# Patient Record
Sex: Female | Born: 1950 | Race: Black or African American | Hispanic: No | Marital: Married | Smoking: Former smoker
Health system: Southern US, Community
[De-identification: ages and names within clinical notes are randomized; demographics above are authoritative.]

## PROBLEM LIST (undated history)

## (undated) ENCOUNTER — Ambulatory Visit: Source: Home / Self Care

## (undated) DIAGNOSIS — K219 Gastro-esophageal reflux disease without esophagitis: Secondary | ICD-10-CM

## (undated) DIAGNOSIS — I10 Essential (primary) hypertension: Secondary | ICD-10-CM

## (undated) DIAGNOSIS — K589 Irritable bowel syndrome without diarrhea: Secondary | ICD-10-CM

## (undated) DIAGNOSIS — K648 Other hemorrhoids: Secondary | ICD-10-CM

## (undated) DIAGNOSIS — E785 Hyperlipidemia, unspecified: Secondary | ICD-10-CM

## (undated) DIAGNOSIS — H269 Unspecified cataract: Secondary | ICD-10-CM

## (undated) DIAGNOSIS — K5790 Diverticulosis of intestine, part unspecified, without perforation or abscess without bleeding: Secondary | ICD-10-CM

## (undated) DIAGNOSIS — D649 Anemia, unspecified: Secondary | ICD-10-CM

## (undated) DIAGNOSIS — T7840XA Allergy, unspecified, initial encounter: Secondary | ICD-10-CM

## (undated) DIAGNOSIS — M797 Fibromyalgia: Secondary | ICD-10-CM

## (undated) DIAGNOSIS — K635 Polyp of colon: Secondary | ICD-10-CM

## (undated) HISTORY — DX: Unspecified cataract: H26.9

## (undated) HISTORY — DX: Diverticulosis of intestine, part unspecified, without perforation or abscess without bleeding: K57.90

## (undated) HISTORY — PX: COLONOSCOPY: SHX174

## (undated) HISTORY — DX: Fibromyalgia: M79.7

## (undated) HISTORY — PX: UPPER GASTROINTESTINAL ENDOSCOPY: SHX188

## (undated) HISTORY — DX: Gastro-esophageal reflux disease without esophagitis: K21.9

## (undated) HISTORY — DX: Allergy, unspecified, initial encounter: T78.40XA

## (undated) HISTORY — PX: TUBAL LIGATION: SHX77

## (undated) HISTORY — DX: Anemia, unspecified: D64.9

## (undated) HISTORY — DX: Essential (primary) hypertension: I10

## (undated) HISTORY — DX: Hyperlipidemia, unspecified: E78.5

## (undated) HISTORY — DX: Other hemorrhoids: K64.8

## (undated) HISTORY — DX: Irritable bowel syndrome, unspecified: K58.9

## (undated) HISTORY — PX: MYOMECTOMY: SHX85

## (undated) HISTORY — DX: Polyp of colon: K63.5

---

## 1998-08-29 ENCOUNTER — Encounter: Payer: Self-pay | Admitting: Emergency Medicine

## 1998-08-29 ENCOUNTER — Emergency Department (HOSPITAL_COMMUNITY): Admission: EM | Admit: 1998-08-29 | Discharge: 1998-08-29 | Payer: Self-pay | Admitting: Emergency Medicine

## 2000-05-17 ENCOUNTER — Other Ambulatory Visit: Admission: RE | Admit: 2000-05-17 | Discharge: 2000-05-17 | Payer: Self-pay | Admitting: Obstetrics and Gynecology

## 2000-11-04 ENCOUNTER — Emergency Department (HOSPITAL_COMMUNITY): Admission: EM | Admit: 2000-11-04 | Discharge: 2000-11-04 | Payer: Self-pay | Admitting: Emergency Medicine

## 2001-12-06 ENCOUNTER — Other Ambulatory Visit: Admission: RE | Admit: 2001-12-06 | Discharge: 2001-12-06 | Payer: Self-pay | Admitting: Obstetrics and Gynecology

## 2006-02-09 ENCOUNTER — Ambulatory Visit: Payer: Self-pay | Admitting: Gastroenterology

## 2006-03-09 ENCOUNTER — Ambulatory Visit: Payer: Self-pay | Admitting: Gastroenterology

## 2006-03-09 ENCOUNTER — Encounter (INDEPENDENT_AMBULATORY_CARE_PROVIDER_SITE_OTHER): Payer: Self-pay | Admitting: *Deleted

## 2006-04-08 ENCOUNTER — Emergency Department (HOSPITAL_COMMUNITY): Admission: EM | Admit: 2006-04-08 | Discharge: 2006-04-08 | Payer: Self-pay | Admitting: Emergency Medicine

## 2007-03-11 DIAGNOSIS — K635 Polyp of colon: Secondary | ICD-10-CM

## 2007-03-11 HISTORY — DX: Polyp of colon: K63.5

## 2009-05-07 ENCOUNTER — Encounter: Admission: RE | Admit: 2009-05-07 | Discharge: 2009-05-07 | Payer: Self-pay | Admitting: Obstetrics & Gynecology

## 2010-05-29 NOTE — Assessment & Plan Note (Signed)
[facility]                         GASTROENTEROLOGY OFFICE NOTE   [name], [name]                    MRN:          [id number]  DATE:02/09/2006                            DOB:          Mar 05, 1950    REFERRING PHYSICIAN:  Georgina Quint. Plotnikov, MD   REASON FOR REFERRAL:  Hematochezia and a change in bowel habits.   HISTORY OF PRESENT ILLNESS:  [name] is a 60 year old  African/American female that I previously evaluated in 1999.  She  underwent a colonoscopy at that time for irritable bowel syndrome with  diarrhea and rectal bleeding. External hemorrhoids and a tortuous colon  were noted.  She states that she has had persistent problems with  postprandial diarrhea on a fairly frequent basis, up until about one  year ago, when she developed problems with mild constipation and  difficulty in evacuating stool.  She notes that her stools are generally  soft, but have been difficult to evacuate.  She has had about a one-year  history of intermittent small volume hematochezia and attributes these  symptoms to her known hemorrhoids.  She also notes that her hemorrhoids  tend to swell, but are not painful.  She has gas, bloating and abdominal  pain.  She has infrequent episodes of reflux symptoms associated with  heartburn and nausea.  She takes over-the-counter medications when these  symptoms are active.  They occur about once or twice a month.  She has  two cousins with colon cancer, who developed the disease in their 2's  and 26's.  Her sister developed colon polyps at age 41, and she also has  two uncles with colon polyps.  She notes no weight loss, fevers, chills  or vomiting.   PAST MEDICAL HISTORY:  1. Fibromyalgia.  2. Chronic back pain, leg pain and hip pain.  3. Atypical migraine headaches.  4. Irritable bowel syndrome.  5. Hemorrhoids.  6. Hypertension.  7. Status post bilateral tubal ligation.   CURRENT MEDICATIONS:  Tylenol  p.r.n.   ALLERGIES:  No known drug allergies.   SOCIAL HISTORY/REVIEW OF SYSTEMS:  Per the handwritten form.   PHYSICAL EXAMINATION:  GENERAL:  A well-developed female, in no acute  distress.  VITAL SIGNS:  Height 5 feet 1 inch, weight 159 pounds, blood pressure  122/80, pulse 68 and regular.  HEENT:  Anicteric sclerae.  Oropharynx clear.  CHEST:  Clear to auscultation bilaterally.  HEART:  Regular rate and rhythm without murmurs appreciated.  ABDOMEN:  Soft, nontender, non-distended.  Normoactive bowel sounds.  No  palpable organomegaly, masses or hernia.  RECTAL:  Deferred until time of colonoscopy.  EXTREMITIES:  Without clubbing, cyanosis or edema.  NEUROLOGIC:  Alert and oriented x3.  Grossly nonfocal.   ASSESSMENT/PLAN:  1. Intermittent hematochezia, associated with a change in bowel      habits, with mild constipation and difficulty in fecal evacuation.      Known external hemorrhoids.  Rule out colorectal neoplasms,      hemorrhoidal symptoms, proctitis and a rectocele.  She is advised      to schedule an appointment with her gynecologist.  The risks,      benefits and alternatives to colonoscopy with possible biopsy and      possible polypectomy and possible destruction of internal      hemorrhoids were discussed with the patient and she consents to      proceed.  This will be scheduled electively.  2. Intermittent reflux symptoms:  She is given standard instructions      on anti-reflux measures and she may use an over-the-counter      medication such as Pepcid AC or Zantac 150 on a p.r.n. basis.     Venita Lick. Russella Dar, MD, [facility]  Electronically Signed    MTS/MedQ  DD: 02/09/2006  DT: 02/09/2006  Job #: [id number]   cc:   Georgina Quint. Plotnikov, MD

## 2011-03-04 ENCOUNTER — Encounter: Payer: Self-pay | Admitting: Gastroenterology

## 2011-10-25 ENCOUNTER — Ambulatory Visit: Payer: Self-pay | Admitting: Physician Assistant

## 2011-10-25 VITALS — BP 165/89 | HR 60 | Temp 98.0°F | Resp 16 | Ht 62.5 in | Wt 160.0 lb

## 2011-10-25 DIAGNOSIS — F419 Anxiety disorder, unspecified: Secondary | ICD-10-CM

## 2011-10-25 DIAGNOSIS — L299 Pruritus, unspecified: Secondary | ICD-10-CM

## 2011-10-25 DIAGNOSIS — F411 Generalized anxiety disorder: Secondary | ICD-10-CM

## 2011-10-25 DIAGNOSIS — I10 Essential (primary) hypertension: Secondary | ICD-10-CM

## 2011-10-25 MED ORDER — LISINOPRIL 20 MG PO TABS: 20.0000 mg | ORAL_TABLET | Freq: Every day | ORAL | Status: DC

## 2011-10-25 MED ORDER — CITALOPRAM HYDROBROMIDE 20 MG PO TABS: 20.0000 mg | ORAL_TABLET | Freq: Every day | ORAL | Status: DC

## 2011-10-25 MED ORDER — ALPRAZOLAM 0.25 MG PO TABS: 0.2500 mg | ORAL_TABLET | Freq: Two times a day (BID) | ORAL | Status: DC | PRN

## 2011-10-25 NOTE — Progress Notes (Signed)
Subjective:    Patient ID: [name], female    DOB: 1950-06-20, 61 y.o.   MRN: [id number]  HPI  [name] is a 61 yr old female here with multiple complaints.  First, her blood pressure is consistently elevated.  At home, her BP has peaked at 164/112.  She has previously been treated with HCTZ several years ago, but ran out of the medication and stopped taking it.  Her second complaint is that her skin has been itching for 1-2 months.  She has had no new exposures.  No one else in the home is itching.  She does not note any rash.  She has not taken anything for the itching.  Her third complaint is that she needs something for her "nerves".  She describes feeling stress and tension.  She is not able to relax.  She is occasionally having difficulty sleeping.  She has episodes where she feels her blood pressure rise and her pulse races.  The patient does not have insurance and is concerned about cost of medication.  She is also concerned about medication side effects.    Review of Systems  Constitutional: Negative for fever and chills.  HENT: Negative.   Eyes: Negative.   Respiratory: Negative.   Cardiovascular: Positive for palpitations.  Gastrointestinal: Positive for abdominal pain (IBS).  Genitourinary: Negative.   Musculoskeletal: Negative.   Skin: Negative.   Neurological: Negative for dizziness, syncope, light-headedness and headaches.  Psychiatric/Behavioral: The patient is nervous/anxious.        Objective:   Physical Exam  Constitutional: She is oriented to person, place, and time. She appears well-developed and well-nourished. No distress.  Eyes: EOM are normal. Pupils are equal, round, and reactive to light.  Fundoscopic exam:      The right eye shows no AV nicking, no exudate, no hemorrhage and no papilledema. The right eye shows red reflex.      The left eye shows no AV nicking, no exudate, no hemorrhage and no papilledema. The left eye shows red reflex. Neck: Neck  supple. No thyromegaly present.  Cardiovascular: Normal rate, regular rhythm, normal heart sounds and intact distal pulses.  Exam reveals no gallop and no friction rub.   No murmur heard. Pulmonary/Chest: Breath sounds normal. She has no wheezes. She has no rales.  Lymphadenopathy:    She has no cervical adenopathy.  Neurological: She is alert and oriented to person, place, and time.  Skin: Skin is warm and dry. No rash noted. No erythema.  Psychiatric: She has a normal mood and affect. Her behavior is normal. Judgment and thought content normal.    Filed Vitals:   10/25/11 1624  BP: 165/89  Pulse: 60  Temp: 98 F (36.7 C)  Resp: 16         Assessment & Plan:   1. Hypertension  lisinopril (PRINIVIL,ZESTRIL) 20 MG tablet, Comprehensive metabolic panel  2. Pruritus    3. Anxiety  citalopram (CELEXA) 20 MG tablet, ALPRAZolam (XANAX) 0.25 MG tablet   [name] is a 61 yr old female here with multiple complaints.  Given her elevated blood pressure in clinic today and her history of using antihypertensives, will start her on 20mg  of lisinopril today.  Discussed the importance of keeping her blood pressure under control.  Cmet is pending for baseline kidney function.  For pruritus, I have recommended that she try OTC cetirizine for relief of symptoms.  She is amenable to a trial of celexa and prn xanax for her  anxiety symptoms.  Discussed that celexa should be taken daily, while the xanax should be used prn for acute anxiety symptoms.  Discussed with the patient the importance of follow-up during the first few weeks of treatment.  She was concerned about the cost of another visit because she is self-pay, but agreed to return in 4-6 weeks to follow-up on the medications that were started today.

## 2011-10-26 LAB — COMPREHENSIVE METABOLIC PANEL
AST: 15 U/L (ref 0–37)
Albumin: 3.9 g/dL (ref 3.5–5.2)
Alkaline Phosphatase: 51 U/L (ref 39–117)
Potassium: 4.4 mEq/L (ref 3.5–5.3)
Sodium: 141 mEq/L (ref 135–145)
Total Protein: 6.6 g/dL (ref 6.0–8.3)

## 2011-10-26 NOTE — Progress Notes (Signed)
Reviewed and agree.

## 2011-10-26 NOTE — Progress Notes (Deleted)
  Subjective:    Patient ID: [name], female    DOB: 04/23/1950, 61 y.o.   MRN: [id number]  HPI 61 year old female presents with     Review of Systems     Objective:   Physical Exam        Assessment & Plan:

## 2011-10-27 ENCOUNTER — Encounter: Payer: Self-pay | Admitting: Gastroenterology

## 2012-11-02 ENCOUNTER — Other Ambulatory Visit: Payer: Self-pay | Admitting: Physician Assistant

## 2013-02-12 ENCOUNTER — Other Ambulatory Visit: Payer: Self-pay | Admitting: Physician Assistant

## 2013-02-17 ENCOUNTER — Other Ambulatory Visit: Payer: Self-pay | Admitting: Physician Assistant

## 2013-12-04 ENCOUNTER — Ambulatory Visit (INDEPENDENT_AMBULATORY_CARE_PROVIDER_SITE_OTHER): Payer: No Typology Code available for payment source | Admitting: Family Medicine

## 2013-12-04 VITALS — BP 124/74 | HR 66 | Temp 98.3°F | Resp 18 | Ht 62.25 in | Wt 161.4 lb

## 2013-12-04 DIAGNOSIS — Z0000 Encounter for general adult medical examination without abnormal findings: Secondary | ICD-10-CM

## 2013-12-04 DIAGNOSIS — Z124 Encounter for screening for malignant neoplasm of cervix: Secondary | ICD-10-CM

## 2013-12-04 DIAGNOSIS — Z130 Encounter for screening for diseases of the blood and blood-forming organs and certain disorders involving the immune mechanism: Secondary | ICD-10-CM

## 2013-12-04 DIAGNOSIS — Z136 Encounter for screening for cardiovascular disorders: Secondary | ICD-10-CM

## 2013-12-04 DIAGNOSIS — Z23 Encounter for immunization: Secondary | ICD-10-CM

## 2013-12-04 DIAGNOSIS — Z131 Encounter for screening for diabetes mellitus: Secondary | ICD-10-CM

## 2013-12-04 DIAGNOSIS — Z13220 Encounter for screening for lipoid disorders: Secondary | ICD-10-CM

## 2013-12-04 DIAGNOSIS — Z01419 Encounter for gynecological examination (general) (routine) without abnormal findings: Secondary | ICD-10-CM

## 2013-12-04 LAB — POCT URINALYSIS DIPSTICK
BILIRUBIN UA: NEGATIVE
Glucose, UA: NEGATIVE
Ketones, UA: NEGATIVE
LEUKOCYTES UA: NEGATIVE
Nitrite, UA: NEGATIVE
PH UA: 5
Protein, UA: NEGATIVE
Spec Grav, UA: 1.015
Urobilinogen, UA: 0.2

## 2013-12-04 LAB — COMPREHENSIVE METABOLIC PANEL
ALBUMIN: 4.1 g/dL (ref 3.5–5.2)
ALT: 9 U/L (ref 0–35)
AST: 15 U/L (ref 0–37)
Alkaline Phosphatase: 54 U/L (ref 39–117)
BILIRUBIN TOTAL: 0.5 mg/dL (ref 0.2–1.2)
BUN: 10 mg/dL (ref 6–23)
CALCIUM: 9 mg/dL (ref 8.4–10.5)
CHLORIDE: 105 meq/L (ref 96–112)
CO2: 25 meq/L (ref 19–32)
Creat: 0.91 mg/dL (ref 0.50–1.10)
GLUCOSE: 91 mg/dL (ref 70–99)
POTASSIUM: 3.9 meq/L (ref 3.5–5.3)
SODIUM: 137 meq/L (ref 135–145)
TOTAL PROTEIN: 6.6 g/dL (ref 6.0–8.3)

## 2013-12-04 LAB — CBC WITH DIFFERENTIAL/PLATELET
BASOS ABS: 0 10*3/uL (ref 0.0–0.1)
Basophils Relative: 0 % (ref 0–1)
EOS PCT: 1 % (ref 0–5)
Eosinophils Absolute: 0 10*3/uL (ref 0.0–0.7)
HCT: 42.7 % (ref 36.0–46.0)
Hemoglobin: 14.5 g/dL (ref 12.0–15.0)
LYMPHS ABS: 2 10*3/uL (ref 0.7–4.0)
LYMPHS PCT: 57 % — AB (ref 12–46)
MCH: 31.8 pg (ref 26.0–34.0)
MCHC: 34 g/dL (ref 30.0–36.0)
MCV: 93.6 fL (ref 78.0–100.0)
MPV: 11.8 fL (ref 9.4–12.4)
Monocytes Absolute: 0.3 10*3/uL (ref 0.1–1.0)
Monocytes Relative: 8 % (ref 3–12)
NEUTROS PCT: 34 % — AB (ref 43–77)
Neutro Abs: 1.2 10*3/uL — ABNORMAL LOW (ref 1.7–7.7)
PLATELETS: 171 10*3/uL (ref 150–400)
RBC: 4.56 MIL/uL (ref 3.87–5.11)
RDW: 13.7 % (ref 11.5–15.5)
WBC: 3.5 10*3/uL — AB (ref 4.0–10.5)

## 2013-12-04 LAB — LIPID PANEL
Cholesterol: 261 mg/dL — ABNORMAL HIGH (ref 0–200)
HDL: 50 mg/dL (ref >39)
LDL CALC: 189 mg/dL — AB (ref 0–99)
TRIGLYCERIDES: 110 mg/dL (ref <150)
Total CHOL/HDL Ratio: 5.2 Ratio
VLDL: 22 mg/dL (ref 0–40)

## 2013-12-04 LAB — POCT GLYCOSYLATED HEMOGLOBIN (HGB A1C): HEMOGLOBIN A1C: 5.2

## 2013-12-04 LAB — TSH: TSH: 1.055 u[IU]/mL (ref 0.350–4.500)

## 2013-12-04 MED ORDER — ZOSTER VACCINE LIVE 19400 UNT/0.65ML ~~LOC~~ SOLR: 0.6500 mL | Freq: Once | SUBCUTANEOUS | Status: DC

## 2013-12-04 NOTE — Progress Notes (Addendum)
Subjective:  This chart was scribed for Reginia Forts, MD by Randa Evens, ED Scribe. This Patient was seen in room 05 and the patients care was started at 4:25 PM   Patient ID: [name], female    DOB: Dec 24, 1950, 63 y.o.   MRN: [id number]  12/04/2013  Annual Exam    HPI HPI Comments: KHALEESI GRUEL is a 63 y.o. female who presents to the [facility] for complete physical.  She states that her last physical was 2011.  No pap smear or mammogram since 2011.  No colonoscopy since 2007 by Dr. Fuller Plan. States she was diagnosed with diverticulosis.  States that tetanus is not up to date.  Denies ever receiving flu vaccinations but is compliant with receiving one today.  No shingles vaccine.  States last eye exam was last year states she has cataracts.  Last dental appointment in 2011.   Hx of seasonal allergies. Hx of GERD states taking Zantac provides relief. Hx of IBS with constipation and diarrhea. Hx of HTN states she is not taking medications to control her blood pressure.   Mother was 52 when she based due to a hemorrhagic stroke. States mother has heart failure at the age around 56-50. States father dies at the age of 40 from prostate cancer. States she has 4 sisters all living and 4 brothers 1 living. States that her sister suffers from HTN. States that brothers passed away from MI at the age of 77. Brother passed at age 69 from alcoholism. States another brother passed around 3-4 from heart problems. States that living brother has heart problems as well.   She states she is married. Denies any abuse. States she has 3 children and 1 grandchild. She states that she lives with her husband. States that she does not work. States she use to be a smoker and she quit in 1997 after smoking for 27 years. States she exercises 3-4 times per week by walking.   She states that the only medication she takes currently is Biotin.   Pt states that she does have some  intermittent back pain from disc complications. She state she has hearing loss with associated tinnitus. States she use to have a Hx of migraines. Denies chest pain not associated with her GERD. She states some times she notices that she has palpitations. Denies SOB, cough.  States that she does have some right leg swelling   Review of Systems  Constitutional: Negative for fever, chills, diaphoresis, activity change, appetite change, fatigue and unexpected weight change.  HENT: Negative for congestion, dental problem, drooling, ear discharge, ear pain, facial swelling, hearing loss, mouth sores, nosebleeds, postnasal drip, rhinorrhea, sinus pressure, sneezing, sore throat, tinnitus, trouble swallowing and voice change.   Eyes: Negative for photophobia, pain, discharge, redness, itching and visual disturbance.  Respiratory: Negative for apnea, cough, choking, chest tightness, shortness of breath, wheezing and stridor.   Cardiovascular: Positive for palpitations and leg swelling. Negative for chest pain.  Gastrointestinal: Negative for nausea, vomiting, abdominal pain, diarrhea, constipation, blood in stool, abdominal distention, anal bleeding and rectal pain.  Endocrine: Negative for cold intolerance, heat intolerance, polydipsia, polyphagia and polyuria.  Genitourinary: Negative for dysuria, urgency, frequency, hematuria, flank pain, decreased urine volume, vaginal bleeding, vaginal discharge, enuresis, difficulty urinating, genital sores, vaginal pain, menstrual problem, pelvic pain and dyspareunia.  Musculoskeletal: Positive for back pain. Negative for myalgias, joint swelling, arthralgias, gait problem, neck pain and neck stiffness.  Skin: Negative for color change,  pallor, rash and wound.  Allergic/Immunologic: Negative for environmental allergies, food allergies and immunocompromised state.  Neurological: Negative for dizziness, tremors, seizures, syncope, facial asymmetry, speech difficulty,  weakness, light-headedness, numbness and headaches.  Hematological: Negative for adenopathy. Does not bruise/bleed easily.  Psychiatric/Behavioral: Negative for suicidal ideas, hallucinations, behavioral problems, confusion, sleep disturbance, self-injury, dysphoric mood, decreased concentration and agitation. The patient is not nervous/anxious and is not hyperactive.     Past Medical History  Diagnosis Date  . GERD (gastroesophageal reflux disease)   . Anemia   . Hypertension   . IBS (irritable bowel syndrome)     diarrhea, constipation alternating; s/p GI consult.  . Allergy     Zyrtec PRN  . Fibromyalgia     diagnosed by Dr. Teressa Lower. s/p rheumatology consultation   Past Surgical History  Procedure Laterality Date  . Cesarean section    . Tubal ligation     Allergies  Allergen Reactions  . Pollen Extract   . Sulfa Antibiotics     itching   Current Outpatient Prescriptions  Medication Sig Dispense Refill  . Multiple Vitamin (SUPER STRESS 600/BIOTIN PO) Take 600 mg by mouth daily.    Marland Kitchen ALPRAZolam (XANAX) 0.25 MG tablet Take 1 tablet (0.25 mg total) by mouth 2 (two) times daily as needed for sleep. (Patient not taking: Reported on 12/04/2013) 20 tablet 0  . citalopram (CELEXA) 20 MG tablet Take 1 tablet (20 mg total) by mouth daily. (Patient not taking: Reported on 12/04/2013) 30 tablet 3  . lisinopril (PRINIVIL,ZESTRIL) 20 MG tablet Take 1 tablet (20 mg total) by mouth daily. PATIENT NEEDS OFFICE VISIT FOR ADDITIONAL REFILLS (Patient not taking: Reported on 12/04/2013) 30 tablet 0  . Vitamins-Lipotropics (B-50 PO) Take 50 mg by mouth daily.    Marland Kitchen zoster vaccine live, PF, (ZOSTAVAX) 20254 UNT/0.65ML injection Inject 19,400 Units into the skin once. 0.65 mL 0   No current facility-administered medications for this visit.       Objective:    BP 124/74 mmHg  Pulse 66  Temp(Src) 98.3 F (36.8 C) (Oral)  Resp 18  Ht 5' 2.25" (1.581 m)  Wt 161 lb 6.4 oz (73.211 kg)  BMI  29.29 kg/m2  SpO2 99%   Physical Exam  Constitutional: She is oriented to person, place, and time. She appears well-developed and well-nourished. No distress.  HENT:  Head: Normocephalic and atraumatic.  Right Ear: External ear normal.  Left Ear: External ear normal.  Nose: Nose normal.  Mouth/Throat: Oropharynx is clear and moist.  Eyes: Conjunctivae and EOM are normal. Pupils are equal, round, and reactive to light.  Neck: Normal range of motion and full passive range of motion without pain. Neck supple. No JVD present. Carotid bruit is not present. No thyromegaly present.  Cardiovascular: Normal rate, regular rhythm and normal heart sounds.  Exam reveals no gallop and no friction rub.   No murmur heard. Pulmonary/Chest: Effort normal and breath sounds normal. She has no wheezes. She has no rales. Right breast exhibits no inverted nipple, no mass, no nipple discharge, no skin change and no tenderness. Left breast exhibits no inverted nipple, no mass, no nipple discharge, no skin change and no tenderness. Breasts are symmetrical.  Abdominal: Soft. Bowel sounds are normal. She exhibits no distension and no mass. There is no tenderness. There is no rebound and no guarding.  Genitourinary: Vagina normal. Rectal exam shows external hemorrhoid. No labial fusion. There is no rash, tenderness, lesion or injury on the right labia. There  is no rash, tenderness, lesion or injury on the left labia. Uterus is enlarged. Uterus is not tender. Cervix exhibits no motion tenderness, no discharge and no friability. Right adnexum displays no mass, no tenderness and no fullness. Left adnexum displays no mass, no tenderness and no fullness.  Musculoskeletal:       Right shoulder: Normal.       Left shoulder: Normal.       Cervical back: Normal.  right leg: varicose vein around ankle with moderate leg swelling.   Lymphadenopathy:    She has no cervical adenopathy.  Neurological: She is alert and oriented to  person, place, and time. She has normal reflexes. No cranial nerve deficit. She exhibits normal muscle tone. Coordination normal.  Skin: Skin is warm and dry. No rash noted. She is not diaphoretic. No erythema. No pallor.  Psychiatric: She has a normal mood and affect. Her behavior is normal. Judgment and thought content normal.  Nursing note and vitals reviewed.     TDAP ADMINISTERED IN OFFICE.    Assessment & Plan:   1. Screening for deficiency anemia   2. Annual physical exam   3. Encounter for routine gynecological examination   4. Screening for lipid disorders   5. Screening for diabetes mellitus   6. Screening for hypertension   7. Screening for cervical cancer   8. Need for Tdap vaccination   9. Need for Zostavax administration       1. Complete Physical Examination:  Anticipatory guidance provided --- exercise, weight loss, 3 servings of dairy daily. Pap smear obtained; refer for mammogram.  Refer for colonoscopy in upcoming six months; pt to call when ready for referral.  S/p TDAP in office.  Rx for Zostavax provided.   2.  Gynecological exam: pap smear obtained; refer for mammogram. 3. Screening lipid: obtain FLP. 4. Screening DMII: obtain glucose, HgbA1c. 5.  S/p TDAP; rx for Zostavax provided.    Meds ordered this encounter  Medications  . zoster vaccine live, PF, (ZOSTAVAX) 29562 UNT/0.65ML injection    Sig: Inject 19,400 Units into the skin once.    Dispense:  0.65 mL    Refill:  0    No Follow-up on file.     I personally performed the services described in this documentation, which was scribed in my presence. The recorded information has been reviewed and considered.  Reginia Forts, M.D.  [facility] [address] [place], [place]  [address] [phone] phone [phone] fax

## 2013-12-04 NOTE — Patient Instructions (Signed)

## 2013-12-05 LAB — PAP IG AND HPV HIGH-RISK: HPV DNA High Risk: NOT DETECTED

## 2013-12-11 MED ORDER — HYDROCORTISONE ACETATE 25 MG RE SUPP: 25.0000 mg | Freq: Two times a day (BID) | RECTAL | Status: DC

## 2013-12-11 NOTE — Addendum Note (Signed)
Addended by: Wardell Honour on: 12/11/2013 10:49 PM   Modules accepted: Orders

## 2013-12-21 ENCOUNTER — Other Ambulatory Visit: Payer: Self-pay | Admitting: Family Medicine

## 2013-12-21 DIAGNOSIS — Z1231 Encounter for screening mammogram for malignant neoplasm of breast: Secondary | ICD-10-CM

## 2013-12-24 ENCOUNTER — Other Ambulatory Visit: Payer: Self-pay | Admitting: Family Medicine

## 2013-12-24 DIAGNOSIS — N644 Mastodynia: Secondary | ICD-10-CM

## 2013-12-31 ENCOUNTER — Other Ambulatory Visit: Payer: Self-pay | Admitting: Family Medicine

## 2013-12-31 ENCOUNTER — Other Ambulatory Visit: Payer: Self-pay

## 2013-12-31 DIAGNOSIS — N644 Mastodynia: Secondary | ICD-10-CM

## 2014-01-07 ENCOUNTER — Ambulatory Visit
Admission: RE | Admit: 2014-01-07 | Discharge: 2014-01-07 | Disposition: A | Discharge status: Home / Self Care | Payer: No Typology Code available for payment source | Source: Ambulatory Visit | Attending: Family Medicine | Admitting: Family Medicine

## 2014-01-07 DIAGNOSIS — N644 Mastodynia: Secondary | ICD-10-CM

## 2015-01-27 ENCOUNTER — Telehealth: Payer: Self-pay

## 2015-01-27 NOTE — Telephone Encounter (Signed)
Called patient to inform appointment change. [name]

## 2015-02-19 ENCOUNTER — Other Ambulatory Visit: Payer: Self-pay

## 2015-03-04 ENCOUNTER — Encounter: Payer: Self-pay | Admitting: Gastroenterology

## 2015-03-04 ENCOUNTER — Encounter: Payer: Self-pay | Admitting: Family Medicine

## 2015-03-10 ENCOUNTER — Encounter: Payer: No Typology Code available for payment source | Admitting: Family Medicine

## 2015-03-18 ENCOUNTER — Ambulatory Visit (INDEPENDENT_AMBULATORY_CARE_PROVIDER_SITE_OTHER): Payer: Medicare Other | Admitting: Family Medicine

## 2015-03-18 ENCOUNTER — Encounter: Payer: Self-pay | Admitting: Family Medicine

## 2015-03-18 VITALS — BP 151/80 | HR 63 | Temp 98.2°F | Resp 16 | Ht 62.5 in | Wt 169.0 lb

## 2015-03-18 DIAGNOSIS — I10 Essential (primary) hypertension: Secondary | ICD-10-CM | POA: Diagnosis not present

## 2015-03-18 DIAGNOSIS — Z0000 Encounter for general adult medical examination without abnormal findings: Secondary | ICD-10-CM | POA: Diagnosis not present

## 2015-03-18 DIAGNOSIS — K644 Residual hemorrhoidal skin tags: Secondary | ICD-10-CM

## 2015-03-18 DIAGNOSIS — E7800 Pure hypercholesterolemia, unspecified: Secondary | ICD-10-CM | POA: Diagnosis not present

## 2015-03-18 DIAGNOSIS — Z86010 Personal history of colonic polyps: Secondary | ICD-10-CM

## 2015-03-18 DIAGNOSIS — Z114 Encounter for screening for human immunodeficiency virus [HIV]: Secondary | ICD-10-CM

## 2015-03-18 DIAGNOSIS — K648 Other hemorrhoids: Secondary | ICD-10-CM | POA: Diagnosis not present

## 2015-03-18 DIAGNOSIS — K580 Irritable bowel syndrome with diarrhea: Secondary | ICD-10-CM

## 2015-03-18 DIAGNOSIS — Z1159 Encounter for screening for other viral diseases: Secondary | ICD-10-CM | POA: Diagnosis not present

## 2015-03-18 DIAGNOSIS — Z131 Encounter for screening for diabetes mellitus: Secondary | ICD-10-CM

## 2015-03-18 DIAGNOSIS — R319 Hematuria, unspecified: Secondary | ICD-10-CM

## 2015-03-18 DIAGNOSIS — R072 Precordial pain: Secondary | ICD-10-CM | POA: Diagnosis not present

## 2015-03-18 LAB — CBC WITH DIFFERENTIAL/PLATELET
BASOS ABS: 0 10*3/uL (ref 0.0–0.1)
BASOS PCT: 0 % (ref 0–1)
EOS PCT: 3 % (ref 0–5)
Eosinophils Absolute: 0.1 10*3/uL (ref 0.0–0.7)
HCT: 41.1 % (ref 36.0–46.0)
HEMOGLOBIN: 14.3 g/dL (ref 12.0–15.0)
LYMPHS PCT: 39 % (ref 12–46)
Lymphs Abs: 1.3 10*3/uL (ref 0.7–4.0)
MCH: 31.6 pg (ref 26.0–34.0)
MCHC: 34.8 g/dL (ref 30.0–36.0)
MCV: 90.9 fL (ref 78.0–100.0)
MONO ABS: 0.4 10*3/uL (ref 0.1–1.0)
MPV: 11.4 fL (ref 8.6–12.4)
Monocytes Relative: 12 % (ref 3–12)
Neutro Abs: 1.6 10*3/uL — ABNORMAL LOW (ref 1.7–7.7)
Neutrophils Relative %: 46 % (ref 43–77)
Platelets: 161 10*3/uL (ref 150–400)
RBC: 4.52 MIL/uL (ref 3.87–5.11)
RDW: 13.5 % (ref 11.5–15.5)
WBC: 3.4 10*3/uL — AB (ref 4.0–10.5)

## 2015-03-18 LAB — LIPID PANEL
Cholesterol: 254 mg/dL — ABNORMAL HIGH (ref 125–200)
HDL: 44 mg/dL — ABNORMAL LOW (ref >=46)
LDL Cholesterol: 187 mg/dL — ABNORMAL HIGH (ref <130)
TRIGLYCERIDES: 114 mg/dL (ref <150)
Total CHOL/HDL Ratio: 5.8 Ratio — ABNORMAL HIGH (ref <=5.0)
VLDL: 23 mg/dL (ref <30)

## 2015-03-18 LAB — HEPATITIS C ANTIBODY: HCV Ab: NEGATIVE

## 2015-03-18 LAB — POC MICROSCOPIC URINALYSIS (UMFC): Mucus: ABSENT

## 2015-03-18 LAB — POCT URINALYSIS DIP (MANUAL ENTRY)
BILIRUBIN UA: NEGATIVE
Glucose, UA: NEGATIVE
Ketones, POC UA: NEGATIVE
LEUKOCYTES UA: NEGATIVE
NITRITE UA: NEGATIVE
PH UA: 5
PROTEIN UA: NEGATIVE
Spec Grav, UA: 1.02
Urobilinogen, UA: 0.2

## 2015-03-18 LAB — COMPREHENSIVE METABOLIC PANEL
ALBUMIN: 3.8 g/dL (ref 3.6–5.1)
ALT: 15 U/L (ref 6–29)
AST: 15 U/L (ref 10–35)
Alkaline Phosphatase: 57 U/L (ref 33–130)
BILIRUBIN TOTAL: 0.4 mg/dL (ref 0.2–1.2)
BUN: 9 mg/dL (ref 7–25)
CO2: 25 mmol/L (ref 20–31)
CREATININE: 0.91 mg/dL (ref 0.50–0.99)
Calcium: 8.7 mg/dL (ref 8.6–10.4)
Chloride: 108 mmol/L (ref 98–110)
Glucose, Bld: 114 mg/dL — ABNORMAL HIGH (ref 65–99)
Potassium: 3.8 mmol/L (ref 3.5–5.3)
Sodium: 140 mmol/L (ref 135–146)
Total Protein: 6.4 g/dL (ref 6.1–8.1)

## 2015-03-18 LAB — HIV ANTIBODY (ROUTINE TESTING W REFLEX): HIV 1&2 Ab, 4th Generation: NONREACTIVE

## 2015-03-18 MED ORDER — LOSARTAN POTASSIUM 50 MG PO TABS: 50.0000 mg | ORAL_TABLET | Freq: Every day | ORAL | Status: DC

## 2015-03-18 NOTE — Progress Notes (Signed)
Subjective:    Patient ID: [name], female    DOB: [birth year], 65 y.o.   MRN: [id number]  03/18/2015  Annual Exam   HPI This 65 y.o. female presents for evaluation Complete Physical Examination./Welcome to Medicare CPE.  Last physical:  12-04-2013 Pap smear:  12-04-2013 WNL HPV negative. Mammogram:  01-07-2014 Colonoscopy:  03-09-2006 +colon polyps; diverticulosis, IH. Fuller Plan Bone density:  never TDAP:  2015 Pneumovax:  03/2015 Prevnar 13 Zostavax:  rx prescribed 12-04-2013; never had it. Too expensive. Influenza:  03/2015 Eye exam:  +glasses; 2 years; glaucoma; macular degeneration none; +cataracts Dental exam:   Several years ago.   Hypertension: running high.  Asking for order for BP cuff.   Hyperlipidemia: Patient reports good compliance with medication, good tolerance to medication, and good symptom control.    Review of Systems  Constitutional: Negative for fever, chills, diaphoresis, activity change, appetite change, fatigue and unexpected weight change.  HENT: Negative for congestion, dental problem, drooling, ear discharge, ear pain, facial swelling, hearing loss, mouth sores, nosebleeds, postnasal drip, rhinorrhea, sinus pressure, sneezing, sore throat, tinnitus, trouble swallowing and voice change.   Eyes: Negative for photophobia, pain, discharge, redness, itching and visual disturbance.  Respiratory: Negative for apnea, cough, choking, chest tightness, shortness of breath, wheezing and stridor.   Cardiovascular: Negative for chest pain, palpitations and leg swelling.  Gastrointestinal: Negative for nausea, vomiting, abdominal pain, diarrhea, constipation, blood in stool, abdominal distention, anal bleeding and rectal pain.  Endocrine: Negative for cold intolerance, heat intolerance, polydipsia, polyphagia and polyuria.  Genitourinary: Negative for dysuria, urgency, frequency, hematuria, flank pain, decreased urine volume, vaginal bleeding, vaginal discharge,  enuresis, difficulty urinating, genital sores, vaginal pain, menstrual problem, pelvic pain and dyspareunia.  Musculoskeletal: Negative for myalgias, back pain, joint swelling, arthralgias, gait problem, neck pain and neck stiffness.  Skin: Negative for color change, pallor, rash and wound.  Allergic/Immunologic: Negative for environmental allergies, food allergies and immunocompromised state.  Neurological: Negative for dizziness, tremors, seizures, syncope, facial asymmetry, speech difficulty, weakness, light-headedness, numbness and headaches.  Hematological: Negative for adenopathy. Does not bruise/bleed easily.  Psychiatric/Behavioral: Negative for suicidal ideas, hallucinations, behavioral problems, confusion, sleep disturbance, self-injury, dysphoric mood, decreased concentration and agitation. The patient is not nervous/anxious and is not hyperactive.     Past Medical History  Diagnosis Date  . GERD (gastroesophageal reflux disease)   . Anemia   . Hypertension   . IBS (irritable bowel syndrome)     diarrhea, constipation alternating; s/p GI consult.  . Allergy     Zyrtec PRN  . Fibromyalgia     diagnosed by Dr. Teressa Lower. s/p rheumatology consultation  . Colon polyp 03/11/2007  . Internal hemorrhoid     colonoscopy in 2009  . Diverticulosis     by colonoscopy 2009   Past Surgical History  Procedure Laterality Date  . Cesarean section    . Tubal ligation    . Myomectomy     Allergies  Allergen Reactions  . Aspirin   . Pollen Extract   . Sulfa Antibiotics     itching   Current Outpatient Prescriptions  Medication Sig Dispense Refill  . losartan (COZAAR) 50 MG tablet Take 1 tablet (50 mg total) by mouth daily. 90 tablet 1  . rosuvastatin (CRESTOR) 10 MG tablet Take 1 tablet (10 mg total) by mouth daily. 30 tablet 2   No current facility-administered medications for this visit.   Social History   Social History  . Marital Status: Single  Spouse Name: N/A  .  Number of Children: N/A  . Years of Education: N/A   Occupational History  . Not on file.   Social History Main Topics  . Smoking status: Former Smoker -- 2.00 packs/day for 27 years    Quit date: 10/25/1994  . Smokeless tobacco: Not on file  . Alcohol Use: No  . Drug Use: No  . Sexual Activity: Yes   Other Topics Concern  . Not on file   Social History Narrative   Marital status: married x 1969; happily married; no abuse     Children: 3 children; 1 grandchildren.      Lives: with husband, son      Employment:  Homemaker      Tobacco: quit smoking in 1997; smoked x 30 years.      Alcohol: none      Drugs: none      Exercise: none in 2017      Seatbelt: 100%     Guns:  None     ADLs: independent with ADLs; no assistant device      Advance Directives: none; desires FULL CODE; no prolonged measures.   Family History  Problem Relation Age of Onset  . Hypertension Mother   . Stroke Mother     multiple CVAs/TIAs  . Heart disease Mother 59    CHF  . Cancer Father     prostate cancer  . Hypertension Sister   . Hyperlipidemia Sister   . Glaucoma Sister   . Hypertension Brother   . Prostatitis Brother   . Hypertension Maternal Grandmother   . Hypertension Maternal Grandfather   . Drug abuse Paternal Grandmother   . Hypertension Sister   . Mental illness Sister   . Glaucoma Sister        Objective:    BP 151/80 mmHg  Pulse 63  Temp(Src) 98.2 F (36.8 C)  Resp 16  Ht 5' 2.5" (1.588 m)  Wt 169 lb (76.658 kg)  BMI 30.40 kg/m2 Physical Exam  Constitutional: She is oriented to person, place, and time. She appears well-developed and well-nourished. No distress.  HENT:  Head: Normocephalic and atraumatic.  Right Ear: External ear normal.  Left Ear: External ear normal.  Nose: Nose normal.  Mouth/Throat: Oropharynx is clear and moist.  Eyes: Conjunctivae and EOM are normal. Pupils are equal, round, and reactive to light.  Neck: Normal range of motion and full  passive range of motion without pain. Neck supple. No JVD present. Carotid bruit is not present. No thyromegaly present.  Cardiovascular: Normal rate, regular rhythm and normal heart sounds.  Exam reveals no gallop and no friction rub.   No murmur heard. Pulmonary/Chest: Effort normal and breath sounds normal. She has no wheezes. She has no rales.  Abdominal: Soft. Bowel sounds are normal. She exhibits no distension and no mass. There is no tenderness. There is no rebound and no guarding.  Musculoskeletal:       Right shoulder: Normal.       Left shoulder: Normal.       Cervical back: Normal.  Lymphadenopathy:    She has no cervical adenopathy.  Neurological: She is alert and oriented to person, place, and time. She has normal reflexes. No cranial nerve deficit. She exhibits normal muscle tone. Coordination normal.  Skin: Skin is warm and dry. No rash noted. She is not diaphoretic. No erythema. No pallor.  Psychiatric: She has a normal mood and affect. Her behavior is normal. Judgment and  thought content normal.  Nursing note and vitals reviewed.  Depression screen PHQ 2/9 03/18/2015  Decreased Interest 0  Down, Depressed, Hopeless 0  PHQ - 2 Score 0   Fall Risk  03/18/2015  Falls in the past year? No       Assessment & Plan:   1. Welcome to Medicare preventive visit   2. Routine physical examination   3. Pure hypercholesterolemia   4. Hematuria   5. Screening for diabetes mellitus   6. Screening for HIV (human immunodeficiency virus)   7. Need for hepatitis C screening test   8. History of colonic polyps   9. Irritable bowel syndrome with diarrhea   10. Internal hemorrhoid   11. External hemorrhoids   12. Precordial pain   13. Essential hypertension     Orders Placed This Encounter  Procedures  . CBC with Differential/Platelet  . Comprehensive metabolic panel    Order Specific Question:  Has the patient fasted?    Answer:  Yes  . Lipid panel    Order Specific Question:   Has the patient fasted?    Answer:  Yes  . HIV antibody  . Hepatitis C antibody  . Ambulatory referral to Gastroenterology    Referral Priority:  Routine    Referral Type:  Consultation    Referral Reason:  Specialty Services Required    Referred to Provider:  Ladene Artist, MD    Number of Visits Requested:  1  . Ambulatory referral to Cardiology    Referral Priority:  Routine    Referral Type:  Consultation    Referral Reason:  Specialty Services Required    Requested Specialty:  Cardiology    Number of Visits Requested:  1  . POCT urinalysis dipstick  . POCT Microscopic Urinalysis ([facility])  . EKG 12-Lead   Meds ordered this encounter  Medications  . DISCONTD: AFLURIA PRESERVATIVE FREE 0.5 ML SUSY    Sig:   . DISCONTD: PREVNAR 13 SUSP injection    Sig:   . losartan (COZAAR) 50 MG tablet    Sig: Take 1 tablet (50 mg total) by mouth daily.    Dispense:  90 tablet    Refill:  1    Return in about 3 months (around 06/18/2015) for recheck blood pressure.    Sonny Poth Elayne Guerin, M.D. [facility] [address] [place], [place]  [address] [phone] phone [phone] fax

## 2015-03-18 NOTE — Patient Instructions (Addendum)
Because you received labwork today, you will receive an invoice from [employer]. Please contact [facility] at [phone] with questions or concerns regarding your invoice. Our billing staff will not be able to assist you with those questions.  You will be contacted with the lab results as soon as they are available. The fastest way to get your results is to activate your My Chart account. Instructions are located on the last page of this paperwork. If you have not heard from [place] regarding the results in 2 weeks, please contact this office.Keeping You Healthy  Get These Tests  Blood Pressure- Have your blood pressure checked by your healthcare provider at least once a year.  Normal blood pressure is 120/80.  Weight- Have your body mass index (BMI) calculated to screen for obesity.  BMI is a measure of body fat based on height and weight.  You can calculate your own BMI at [employer]velBags.it  Cholesterol- Have your cholesterol checked every year.  Diabetes- Have your blood sugar checked every year if you have high blood pressure, high cholesterol, a family history of diabetes or if you are overweight.  Pap Test - Have a pap test every 1 to 5 years if you have been sexually active.  If you are older than 65 and recent pap tests have been normal you may not need additional pap tests.  In addition, if you have had a hysterectomy  for benign disease additional pap tests are not necessary.  Mammogram-Yearly mammograms are essential for early detection of breast cancer  Screening for Colon Cancer- Colonoscopy starting at age 65. Screening may begin sooner depending on your family history and other health conditions.  Follow up colonoscopy as directed by your Gastroenterologist.  Screening for Osteoporosis- Screening begins at age 20 with bone density scanning, sooner if you are at higher risk for developing Osteoporosis.  Get these medicines  Calcium with Vitamin D-  Your body requires 1200-1500 mg of Calcium a day and [phone] IU of Vitamin D a day.  You can only absorb 500 mg of Calcium at a time therefore Calcium must be taken in 2 or 3 separate doses throughout the day.  Hormones- Hormone therapy has been associated with increased risk for certain cancers and heart disease.  Talk to your healthcare provider about if you need relief from menopausal symptoms.  Aspirin- Ask your healthcare provider about taking Aspirin to prevent Heart Disease and Stroke.  Get these Immuniztions  Flu shot- Every fall  Pneumonia shot- Once after the age of 66; if you are younger ask your healthcare provider if you need a pneumonia shot.  Tetanus- Every ten years.  Zostavax- Once after the age of 66 to prevent shingles.  Take these steps  Don't smoke- Your healthcare provider can help you quit. For tips on how to quit, ask your healthcare provider or go to www.smokefree.gov or call 1-800 QUIT-NOW.  Be physically active- Exercise 5 days a week for a minimum of 30 minutes.  If you are not already physically active, start slow and gradually work up to 30 minutes of moderate physical activity.  Try walking, dancing, bike riding, swimming, etc.  Eat a healthy diet- Eat a variety of healthy foods such as fruits, vegetables, whole grains, low fat milk, low fat cheeses, yogurt, lean meats, chicken, fish, eggs, dried beans, tofu, etc.  For more information go to www.thenutritionsource.org  Dental visit- Brush and floss teeth twice daily; visit your dentist twice a year.  Eye exam-  Visit your Optometrist or Ophthalmologist yearly.  Drink alcohol in moderation- Limit alcohol intake to one drink or less a day.  Never drink and drive.  Depression- Your emotional health is as important as your physical health.  If you're feeling down or losing interest in things you normally enjoy, please talk to your healthcare provider.  Seat Belts- can save your life; always wear  one  Smoke/Carbon Monoxide detectors- These detectors need to be installed on the appropriate level of your home.  Replace batteries at least once a year.  Violence- If anyone is threatening or hurting you, please tell your healthcare provider. Living Will/ Health care power of attorney- Discuss with your healthcare provider and family.

## 2015-03-21 ENCOUNTER — Encounter: Payer: Self-pay | Admitting: Gastroenterology

## 2015-03-27 ENCOUNTER — Ambulatory Visit (INDEPENDENT_AMBULATORY_CARE_PROVIDER_SITE_OTHER): Payer: Medicare Other | Admitting: Cardiology

## 2015-03-27 ENCOUNTER — Encounter: Payer: Self-pay | Admitting: Cardiology

## 2015-03-27 VITALS — BP 142/80 | HR 78 | Ht 61.0 in | Wt 171.1 lb

## 2015-03-27 DIAGNOSIS — E785 Hyperlipidemia, unspecified: Secondary | ICD-10-CM | POA: Diagnosis not present

## 2015-03-27 DIAGNOSIS — R600 Localized edema: Secondary | ICD-10-CM

## 2015-03-27 DIAGNOSIS — R079 Chest pain, unspecified: Secondary | ICD-10-CM | POA: Diagnosis not present

## 2015-03-27 MED ORDER — ROSUVASTATIN CALCIUM 10 MG PO TABS: 10.0000 mg | ORAL_TABLET | Freq: Every day | ORAL | Status: DC

## 2015-03-27 NOTE — Patient Instructions (Addendum)
Medication Instructions:  Your physician has recommended you make the following change in your medication:  1.  START the Crestor 10 mg taking 1 tablet ONCE A WEEK   Labwork: None ordered  Testing/Procedures: Your physician has requested that you have a lower or extremity arterial duplex. This test is an ultrasound of the arteries in the legs or arms. It looks at arterial blood flow in the legs and arms. Allow one hour for Lower and Upper Arterial scans. There are no restrictions or special instructions  Your physician has requested that you have an exercise stress myoview. For further information please visit [email or web address]. Please follow instruction sheet, as given.    Follow-Up: Your physician recommends that you schedule a follow-up appointment in: 3 MONTHS IN THE LIPID CLINIC   Any Other Special Instructions Will Be Listed Below (If Applicable).    If you need a refill on your cardiac medications before your next appointment, please call your pharmacy.

## 2015-03-27 NOTE — Progress Notes (Signed)
Cardiology Office Note    Date:  03/27/2015   ID:  NOEMIE STUFFLE, DOB 1950/07/05, MRN [id number]  PCP:  Reginia Forts, MD  Cardiologist:   Candee Furbish, MD     History of Present Illness:  MCKELL SCIASCIA is a 65 y.o. female here for evaluation of chest pain.Really bad heart burn, sometimes left arm and left neck. Years. Years ago had test, ECHO.Feels pressure like pushing agains jaw. Tired feeling left upper chest. This occurs at rest. Does not seem to be exertional. No other associated symptoms. Frequency may be increasing.  No tob. Quit after 26.   Mother had CHF.   Past Medical History  Diagnosis Date  . GERD (gastroesophageal reflux disease)   . Anemia   . Hypertension   . IBS (irritable bowel syndrome)     diarrhea, constipation alternating; s/p GI consult.  . Allergy     Zyrtec PRN  . Fibromyalgia     diagnosed by Dr. Teressa Lower. s/p rheumatology consultation  . Colon polyp 03/11/2007  . Internal hemorrhoid     colonoscopy in 2009  . Diverticulosis     by colonoscopy 2009    Past Surgical History  Procedure Laterality Date  . Cesarean section    . Tubal ligation    . Myomectomy      Outpatient Prescriptions Prior to Visit  Medication Sig Dispense Refill  . losartan (COZAAR) 50 MG tablet Take 1 tablet (50 mg total) by mouth daily. 90 tablet 1  . zoster vaccine live, PF, (ZOSTAVAX) 09811 UNT/0.65ML injection Inject 19,400 Units into the skin once. 0.65 mL 0   No facility-administered medications prior to visit.     Allergies:   Aspirin; Pollen extract; and Sulfa antibiotics   Social History   Social History  . Marital Status: Single    Spouse Name: N/A  . Number of Children: N/A  . Years of Education: N/A   Social History Main Topics  . Smoking status: Former Smoker -- 2.00 packs/day for 27 years    Quit date: 10/25/1994  . Smokeless tobacco: None  . Alcohol Use: No  . Drug Use: No  . Sexual Activity: Yes   Other Topics Concern  . None     Social History Narrative   Marital status: married x 1969; happily married; no abuse     Children: 3 children; 1 grandchildren.      Lives: with husband, son      Employment:  Homemaker      Tobacco: quit smoking in 1997; smoked x 30 years.      Alcohol: none      Drugs: none      Exercise: none in 2017      Seatbelt: 100%     Guns:  None     ADLs: independent with ADLs; no assistant device      Advance Directives: none; desires FULL CODE; no prolonged measures.     Family History:  The patient's family history includes Cancer in her father; Drug abuse in her paternal grandmother; Glaucoma in her sister and sister; Heart disease (age of onset: 67) in her mother; Hyperlipidemia in her sister; Hypertension in her brother, maternal grandfather, maternal grandmother, mother, sister, and sister; Mental illness in her sister; Prostatitis in her brother; Stroke in her mother.   ROS:   Please see the history of present illness.    ROS leg swelling, hearing loss, cough, visual disturbance, abdominal pain, diarrhea, snoring, wheezing, nausea, constipation, facial swelling  All other systems reviewed and are negative.   PHYSICAL EXAM:   VS:  BP 142/80 mmHg  Pulse 78  Ht 5\' 1"  (1.549 m)  Wt 171 lb 1.9 oz (77.62 kg)  BMI 32.35 kg/m2   GEN: Well nourished, well developed, in no acute distress HEENT: normal Neck: no JVD, carotid bruits, or masses Cardiac: RRR; no murmurs, rubs, or gallops,no edema  Respiratory:  clear to auscultation bilaterally, normal work of breathing GI: soft, nontender, nondistended, + BS MS: no deformity or atrophy Skin: warm and dry, no rash, right lower extremity, 3+ edema, chronic appearing. Left normal. Neuro:  Alert and Oriented x 3, Strength and sensation are intact Psych: euthymic mood, full affect  Wt Readings from Last 3 Encounters:  03/27/15 171 lb 1.9 oz (77.62 kg)  03/18/15 169 lb (76.658 kg)  12/04/13 161 lb 6.4 oz (73.211 kg)      Studies/Labs  Reviewed:   EKG:  EKG is.not ordered today.  03/7515-sinus rhythm, no other abnormalities, no ST segment changes personally viewed.  Recent Labs: 03/18/2015: ALT 15; BUN 9; Creat 0.91; Hemoglobin 14.3; Platelets 161; Potassium 3.8; Sodium 140   Lipid Panel    Component Value Date/Time   CHOL 254* 03/18/2015 0841   TRIG 114 03/18/2015 0841   HDL 44* 03/18/2015 0841   CHOLHDL 5.8* 03/18/2015 0841   VLDL 23 03/18/2015 0841   LDLCALC 187* 03/18/2015 0841    Additional studies/ records that were reviewed today include:  Prior lab work, EKG, office notes reviewed    ASSESSMENT:    1. Chest pain, unspecified chest pain type   2. Edema of right lower extremity   3. Hyperlipidemia      PLAN:  In order of problems listed above:  Chest pain -Has both typical as well as atypical features. We will go ahead and schedule a nuclear stress test for further evaluation of potential ischemia.  Hyperlipidemia -LDL 187 -Strongly recommend statin therapy. -Both she and her sisters have had troubles with statin medications in the past. -She had felt some leg weakness in the past when taking statins. I think she would highly benefit from this type of therapy but we have to be careful and cautious. I recommend to her Crestor 10 mg to be taken once a week. We will see how she does with this. I will have her see the lipid clinic back in 3 months.  Lower extremity edema of right leg -Fairly chronic. Perhaps after accident. -I will go ahead and check a lower extremity ultrasound to ensure that there is no evidence of DVT.Likely venous insufficiency. She is wearing support hose for quite some time. May benefit from vascular consultation.     Medication Adjustments/Labs and Tests Ordered: Current medicines are reviewed at length with the patient today.  Concerns regarding medicines are outlined above.  Medication changes, Labs and Tests ordered today are listed in the Patient Instructions  below. Patient Instructions  Medication Instructions:  Your physician has recommended you make the following change in your medication:  1.  START the Crestor 10 mg taking 1 tablet ONCE A WEEK   Labwork: None ordered  Testing/Procedures: Your physician has requested that you have a lower or extremity arterial duplex. This test is an ultrasound of the arteries in the legs or arms. It looks at arterial blood flow in the legs and arms. Allow one hour for Lower and Upper Arterial scans. There are no restrictions or special instructions  Your physician has requested that  you have an exercise stress myoview. For further information please visit [email or web address]. Please follow instruction sheet, as given.    Follow-Up: Your physician recommends that you schedule a follow-up appointment in: 3 MONTHS IN THE LIPID CLINIC   Any Other Special Instructions Will Be Listed Below (If Applicable).    If you need a refill on your cardiac medications before your next appointment, please call your pharmacy.         Bobby Rumpf, MD  03/27/2015 5:13 PM    [facility] [place], [place], [place]  [address] Phone: [phone]; Fax: [phone]

## 2015-04-01 ENCOUNTER — Telehealth (HOSPITAL_COMMUNITY): Payer: Self-pay | Admitting: *Deleted

## 2015-04-01 ENCOUNTER — Ambulatory Visit (HOSPITAL_COMMUNITY)
Admission: RE | Admit: 2015-04-01 | Discharge: 2015-04-01 | Disposition: A | Discharge status: Home / Self Care | Payer: Medicare Other | Source: Ambulatory Visit | Attending: Cardiology | Admitting: Cardiology

## 2015-04-01 DIAGNOSIS — R600 Localized edema: Secondary | ICD-10-CM | POA: Diagnosis not present

## 2015-04-01 DIAGNOSIS — K219 Gastro-esophageal reflux disease without esophagitis: Secondary | ICD-10-CM | POA: Diagnosis not present

## 2015-04-01 DIAGNOSIS — I10 Essential (primary) hypertension: Secondary | ICD-10-CM | POA: Diagnosis not present

## 2015-04-01 NOTE — Telephone Encounter (Signed)
Left message on voicemail per DPR in reference to upcoming appointment scheduled on 04/03/15 at [facility] with detailed instructions given per Myocardial Perfusion Study Information Sheet for the test. LM to arrive 15 minutes early, and that it is imperative to arrive on time for appointment to keep from having the test rescheduled. If you need to cancel or reschedule your appointment, please call the office within 24 hours of your appointment. Failure to do so may result in a cancellation of your appointment, and a $50 no show fee. Phone number given for call back for any questions. Elisama Thissen, Ranae Palms

## 2015-04-03 ENCOUNTER — Ambulatory Visit (HOSPITAL_COMMUNITY): Payer: Medicare Other | Attending: Cardiology

## 2015-04-03 DIAGNOSIS — R079 Chest pain, unspecified: Secondary | ICD-10-CM | POA: Diagnosis not present

## 2015-04-03 DIAGNOSIS — I10 Essential (primary) hypertension: Secondary | ICD-10-CM | POA: Diagnosis not present

## 2015-04-03 LAB — MYOCARDIAL PERFUSION IMAGING
CHL CUP MPHR: 155 {beats}/min
CHL CUP NUCLEAR SDS: 0
CHL CUP NUCLEAR SRS: 3
CHL CUP NUCLEAR SSS: 3
CSEPED: 5 min
CSEPPHR: 139 {beats}/min
Estimated workload: 7 METS
Exercise duration (sec): 0 s
LV sys vol: 17 mL
LVDIAVOL: 59 mL (ref 46–106)
NUC STRESS TID: 0.9
Percent HR: 91 %
RATE: 0.26
RPE: 18
Rest HR: 59 {beats}/min

## 2015-04-03 MED ORDER — TECHNETIUM TC 99M SESTAMIBI GENERIC - CARDIOLITE
32.3000 | Freq: Once | INTRAVENOUS | Status: AC | PRN
  Administered 2015-04-03: 32.3 via INTRAVENOUS

## 2015-04-03 MED ORDER — TECHNETIUM TC 99M SESTAMIBI GENERIC - CARDIOLITE
10.9000 | Freq: Once | INTRAVENOUS | Status: AC | PRN
  Administered 2015-04-03: 10.9 via INTRAVENOUS

## 2015-04-04 ENCOUNTER — Telehealth: Payer: Self-pay | Admitting: Cardiology

## 2015-04-04 NOTE — Telephone Encounter (Signed)
Reviewed results of testing with pt who states understanding.  All questions answered and she thanked me for my time.

## 2015-04-04 NOTE — Telephone Encounter (Signed)
New message  Pt calling to get lab results from rn

## 2015-04-13 ENCOUNTER — Encounter: Payer: Self-pay | Admitting: Family Medicine

## 2015-05-01 ENCOUNTER — Ambulatory Visit (AMBULATORY_SURGERY_CENTER): Payer: Self-pay | Admitting: *Deleted

## 2015-05-01 VITALS — Ht 61.0 in | Wt 171.0 lb

## 2015-05-01 DIAGNOSIS — Z86010 Personal history of colonic polyps: Secondary | ICD-10-CM

## 2015-05-01 MED ORDER — NA SULFATE-K SULFATE-MG SULF 17.5-3.13-1.6 GM/177ML PO SOLN: 1.0000 | Freq: Once | ORAL | Status: DC

## 2015-05-01 NOTE — Progress Notes (Signed)
No egg or soy allergy. No anesthesia problems.  No home O2.  No diet meds.  

## 2015-05-15 ENCOUNTER — Ambulatory Visit (AMBULATORY_SURGERY_CENTER): Payer: Medicare Other | Admitting: Gastroenterology

## 2015-05-15 ENCOUNTER — Encounter: Payer: Self-pay | Admitting: Gastroenterology

## 2015-05-15 VITALS — BP 132/79 | HR 73 | Temp 97.5°F | Resp 15 | Ht 61.0 in | Wt 171.0 lb

## 2015-05-15 DIAGNOSIS — Z86010 Personal history of colonic polyps: Secondary | ICD-10-CM

## 2015-05-15 MED ORDER — SODIUM CHLORIDE 0.9 % IV SOLN: 500.0000 mL | INTRAVENOUS | Status: DC

## 2015-05-15 NOTE — Op Note (Signed)
[place] Patient Name: [name] Procedure Date: 05/15/2015 10:41 AM MRN: [id number] Endoscopist: Ladene Artist , MD Age: 65 Date of Birth: 24-May-1950 Gender: Female Procedure:                Colonoscopy Indications:              Surveillance: Personal history of adenomatous                            polyps on last colonoscopy > 5 years ago, Colon                            cancer screening in patient at increased risk:                            Family history of colorectal cancer in multiple 2nd                            degree relatives Medicines:                Monitored Anesthesia Care Procedure:                Pre-Anesthesia Assessment:                           - Prior to the procedure, a History and Physical                            was performed, and patient medications and                            allergies were reviewed. The patient's tolerance of                            previous anesthesia was also reviewed. The risks                            and benefits of the procedure and the sedation                            options and risks were discussed with the patient.                            All questions were answered, and informed consent                            was obtained. Prior Anticoagulants: The patient has                            taken no previous anticoagulant or antiplatelet                            agents. ASA Grade Assessment: II - A patient with  mild systemic disease. After reviewing the risks                            and benefits, the patient was deemed in                            satisfactory condition to undergo the procedure.                           After obtaining informed consent, the colonoscope                            was passed under direct vision. Throughout the                            procedure, the patient's blood pressure, pulse, and                            oxygen  saturations were monitored continuously. The                            Model PCF-H190DL [id number]) scope was introduced                            through the anus and advanced to the the cecum,                            identified by appendiceal orifice and ileocecal                            valve. The colonoscopy was performed without                            difficulty. The patient tolerated the procedure                            well. The quality of the bowel preparation was                            good. The ileocecal valve, appendiceal orifice, and                            rectum were photographed. Scope In: 10:53:59 AM Scope Out: 11:05:57 AM Scope Withdrawal Time: 0 hours 9 minutes 3 seconds  Total Procedure Duration: 0 hours 11 minutes 58 seconds  Findings:                 The perianal exam findings include non-thrombosed                            external hemorrhoids.                           Multiple medium-mouthed diverticula were found in  the sigmoid colon and descending colon. There was                            narrowing of the colon in association with the                            diverticular opening. There was evidence of                            diverticular spasm. There was no evidence of                            diverticular bleeding.                           Many small-mouthed diverticula were found in the                            transverse colon, hepatic flexure and ascending                            colon. There was no evidence of diverticular                            bleeding.                           The exam was otherwise normal throughout the                            examined colon.                           The retroflexed view of the distal rectum and anal                            verge was normal and showed no anal or rectal                            abnormalities. Complications:            No  immediate complications. Estimated Blood Loss:     Estimated blood loss: none. Impression:               - Non-thrombosed external hemorrhoids found on                            perianal exam.                           - Moderate diverticulosis in the sigmoid colon and                            in the descending colon.                           - Moderate diverticulosis in  the transverse colon,                            at the hepatic flexure and in the ascending colon. Recommendation:           - Patient has a contact number available for                            emergencies. The signs and symptoms of potential                            delayed complications were discussed with the                            patient. Return to normal activities tomorrow.                            Written discharge instructions were provided to the                            patient.                           - High fiber diet indefinitely.                           - Continue present medications.                           - Repeat colonoscopy in 5 years for surveillance. Ladene Artist, MD 05/15/2015 11:11:57 AM This report has been signed electronically.

## 2015-05-15 NOTE — Progress Notes (Signed)
Report to PACU, RN, vss, BBS= Clear.  

## 2015-05-15 NOTE — Patient Instructions (Signed)
YOU HAD AN ENDOSCOPIC PROCEDURE TODAY AT [facility]:   Refer to the procedure report that was given to you for any specific questions about what was found during the examination.  If the procedure report does not answer your questions, please call your gastroenterologist to clarify.  If you requested that your care partner not be given the details of your procedure findings, then the procedure report has been included in a sealed envelope for you to review at your convenience later.  YOU SHOULD EXPECT: Some feelings of bloating in the abdomen. Passage of more gas than usual.  Walking can help get rid of the air that was put into your GI tract during the procedure and reduce the bloating. If you had a lower endoscopy (such as a colonoscopy or flexible sigmoidoscopy) you may notice spotting of blood in your stool or on the toilet paper. If you underwent a bowel prep for your procedure, you may not have a normal bowel movement for a few days.  Please Note:  You might notice some irritation and congestion in your nose or some drainage.  This is from the oxygen used during your procedure.  There is no need for concern and it should clear up in a day or so.  SYMPTOMS TO REPORT IMMEDIATELY:   Following lower endoscopy (colonoscopy or flexible sigmoidoscopy):  Excessive amounts of blood in the stool  Significant tenderness or worsening of abdominal pains  Swelling of the abdomen that is new, acute  Fever of 100F or higher  For urgent or emergent issues, a gastroenterologist can be reached at any hour by calling [phone].  DIET: Your first meal following the procedure should be a small meal and then it is ok to progress to your normal diet. Heavy or fried foods are harder to digest and may make you feel nauseous or bloated.  Likewise, meals heavy in dairy and vegetables can increase bloating.  Drink plenty of fluids but you should avoid alcoholic beverages for 24 hours.  ACTIVITY:   You should plan to take it easy for the rest of today and you should NOT DRIVE or use heavy machinery until tomorrow (because of the sedation medicines used during the test).    FOLLOW UP: Our staff will call the number listed on your records the next business day following your procedure to check on you and address any questions or concerns that you may have regarding the information given to you following your procedure. If we do not reach you, we will leave a message.  However, if you are feeling well and you are not experiencing any problems, there is no need to return our call.  We will assume that you have returned to your regular daily activities without incident.  SIGNATURES/CONFIDENTIALITY: You and/or your care partner have signed paperwork which will be entered into your electronic medical record.  These signatures attest to the fact that that the information above on your After Visit Summary has been reviewed and is understood.  Full responsibility of the confidentiality of this discharge information lies with you and/or your care-partner.  Please read over handouts about diverticulosis, hemorrhoids and high fiber diets- follow a high fiber diet indefinitely  Continue your normal medications  Next colonoscopy- 5 years

## 2015-05-16 ENCOUNTER — Telehealth: Payer: Self-pay | Admitting: *Deleted

## 2015-05-16 NOTE — Telephone Encounter (Signed)
  No answer, left message to call if questions.

## 2015-06-06 ENCOUNTER — Telehealth: Payer: Self-pay | Admitting: *Deleted

## 2015-06-06 NOTE — Telephone Encounter (Signed)
Per call from the pharmacist at [facility], patient has not been refilling the rosuvastatin. He stated that he questioned the patient on this and she indicated that it is due to the cost of $45/month. He suggested switching the patient to simvastatin or atorvastatin. Please advise. Thanks, MI

## 2015-06-10 MED ORDER — ATORVASTATIN CALCIUM 40 MG PO TABS: 40.0000 mg | ORAL_TABLET | Freq: Every day | ORAL | Status: DC

## 2015-06-10 NOTE — Telephone Encounter (Signed)
I'm fine with her switching over to atorvastatin 40 mg once a day because of cost of Crestor. Candee Furbish, MD

## 2015-06-10 NOTE — Telephone Encounter (Signed)
Left message for pt RX changed and sent into pharmacy.  Requested she c/b if questions or concerns.

## 2015-06-12 ENCOUNTER — Telehealth: Payer: Self-pay | Admitting: *Deleted

## 2015-06-12 NOTE — Telephone Encounter (Signed)
Reviewed RX with Dr Marlou Porch who ordered for pt to take 40 mg a day.  Pt is aware.  She reports she will try it and let us know if she has any problems with it.  Advised if she is not able to take the atorvastatin she will be referred to the lipid clinic to further management her hyperlipidemia.

## 2015-06-12 NOTE — Telephone Encounter (Signed)
Patient called and stated that she would like to speak with you to discuss the atorvastatin. She is questioning if she will need to take it qd or once a week. She can be reached at [phone]. Thanks, MI

## 2015-06-30 ENCOUNTER — Other Ambulatory Visit: Payer: Self-pay | Admitting: Pharmacist

## 2015-06-30 ENCOUNTER — Ambulatory Visit: Payer: Medicare Other

## 2015-06-30 DIAGNOSIS — E785 Hyperlipidemia, unspecified: Secondary | ICD-10-CM

## 2015-07-22 ENCOUNTER — Ambulatory Visit (INDEPENDENT_AMBULATORY_CARE_PROVIDER_SITE_OTHER): Payer: Medicare Other | Admitting: Family Medicine

## 2015-07-22 ENCOUNTER — Ambulatory Visit (INDEPENDENT_AMBULATORY_CARE_PROVIDER_SITE_OTHER): Payer: Medicare Other

## 2015-07-22 ENCOUNTER — Encounter: Payer: Self-pay | Admitting: Family Medicine

## 2015-07-22 VITALS — BP 132/88 | HR 77 | Temp 98.6°F | Resp 16 | Ht 61.0 in | Wt 170.8 lb

## 2015-07-22 DIAGNOSIS — M47816 Spondylosis without myelopathy or radiculopathy, lumbar region: Secondary | ICD-10-CM | POA: Diagnosis not present

## 2015-07-22 DIAGNOSIS — M545 Low back pain, unspecified: Secondary | ICD-10-CM

## 2015-07-22 DIAGNOSIS — E7800 Pure hypercholesterolemia, unspecified: Secondary | ICD-10-CM | POA: Diagnosis not present

## 2015-07-22 DIAGNOSIS — I10 Essential (primary) hypertension: Secondary | ICD-10-CM | POA: Diagnosis not present

## 2015-07-22 DIAGNOSIS — R7302 Impaired glucose tolerance (oral): Secondary | ICD-10-CM | POA: Diagnosis not present

## 2015-07-22 DIAGNOSIS — S8991XA Unspecified injury of right lower leg, initial encounter: Secondary | ICD-10-CM

## 2015-07-22 DIAGNOSIS — M79604 Pain in right leg: Secondary | ICD-10-CM | POA: Diagnosis not present

## 2015-07-22 LAB — LIPID PANEL
CHOL/HDL RATIO: 3.9 ratio (ref <=5.0)
CHOLESTEROL: 201 mg/dL — AB (ref 125–200)
HDL: 52 mg/dL (ref >=46)
LDL Cholesterol: 125 mg/dL (ref <130)
TRIGLYCERIDES: 118 mg/dL (ref <150)
VLDL: 24 mg/dL (ref <30)

## 2015-07-22 LAB — COMPREHENSIVE METABOLIC PANEL
ALBUMIN: 4.1 g/dL (ref 3.6–5.1)
ALT: 9 U/L (ref 6–29)
AST: 12 U/L (ref 10–35)
Alkaline Phosphatase: 65 U/L (ref 33–130)
BUN: 9 mg/dL (ref 7–25)
CALCIUM: 9 mg/dL (ref 8.6–10.4)
CHLORIDE: 105 mmol/L (ref 98–110)
CO2: 23 mmol/L (ref 20–31)
Creat: 0.9 mg/dL (ref 0.50–0.99)
Glucose, Bld: 98 mg/dL (ref 65–99)
POTASSIUM: 3.9 mmol/L (ref 3.5–5.3)
SODIUM: 138 mmol/L (ref 135–146)
Total Bilirubin: 0.6 mg/dL (ref 0.2–1.2)
Total Protein: 6.7 g/dL (ref 6.1–8.1)

## 2015-07-22 LAB — CBC WITH DIFFERENTIAL/PLATELET
BASOS ABS: 0 {cells}/uL (ref 0–200)
Basophils Relative: 0 %
EOS PCT: 2 %
Eosinophils Absolute: 56 cells/uL (ref 15–500)
HCT: 43.8 % (ref 35.0–45.0)
HEMOGLOBIN: 14.7 g/dL (ref 11.7–15.5)
LYMPHS ABS: 1400 {cells}/uL (ref 850–3900)
Lymphocytes Relative: 50 %
MCH: 31.1 pg (ref 27.0–33.0)
MCHC: 33.6 g/dL (ref 32.0–36.0)
MCV: 92.6 fL (ref 80.0–100.0)
MPV: 11.7 fL (ref 7.5–12.5)
Monocytes Absolute: 224 cells/uL (ref 200–950)
Monocytes Relative: 8 %
NEUTROS PCT: 40 %
Neutro Abs: 1120 cells/uL — ABNORMAL LOW (ref 1500–7800)
Platelets: 176 10*3/uL (ref 140–400)
RBC: 4.73 MIL/uL (ref 3.80–5.10)
RDW: 13.6 % (ref 11.0–15.0)
WBC: 2.8 10*3/uL — ABNORMAL LOW (ref 3.8–10.8)

## 2015-07-22 LAB — HEMOGLOBIN A1C
Hgb A1c MFr Bld: 5.5 % (ref <5.7)
MEAN PLASMA GLUCOSE: 111 mg/dL

## 2015-07-22 NOTE — Patient Instructions (Signed)
Low Back Sprain With Rehab A sprain is an injury in which a ligament is torn. The ligaments of the lower back are vulnerable to sprains. However, they are strong and require great force to be injured. These ligaments are important for stabilizing the spinal column. Sprains are classified into three categories. Grade 1 sprains cause pain, but the tendon is not lengthened. Grade 2 sprains include a lengthened ligament, due to the ligament being stretched or partially ruptured. With grade 2 sprains there is still function, although the function may be decreased. Grade 3 sprains involve a complete tear of the tendon or muscle, and function is usually impaired. SYMPTOMS   Severe pain in the lower back.  Sometimes, a feeling of a "pop," "snap," or tear, at the time of injury.  Tenderness and sometimes swelling at the injury site.  Uncommonly, bruising (contusion) within 48 hours of injury.  Muscle spasms in the back. CAUSES  Low back sprains occur when a force is placed on the ligaments that is greater than they can handle. Common causes of injury include:  Performing a stressful act while off-balance.  Repetitive stressful activities that involve movement of the lower back.  Direct hit (trauma) to the lower back. RISK INCREASES WITH:  Contact sports (football, wrestling).  Collisions (major skiing accidents).  Sports that require throwing or lifting (baseball, weightlifting).  Sports involving twisting of the spine (gymnastics, diving, tennis, golf).  Poor strength and flexibility.  Inadequate protection.  Previous back injury or surgery (especially fusion). PREVENTION  Wear properly fitted and padded protective equipment.  Warm up and stretch properly before activity.  Allow for adequate recovery between workouts.  Maintain physical fitness:  Strength, flexibility, and endurance.  Cardiovascular fitness.  Maintain a healthy body weight. PROGNOSIS  If treated properly,  low back sprains usually heal with non-surgical treatment. The length of time for healing depends on the severity of the injury.  RELATED COMPLICATIONS   Recurring symptoms, resulting in a chronic problem.  Chronic inflammation and pain in the low back.  Delayed healing or resolution of symptoms, especially if activity is resumed too soon.  Prolonged impairment.  Unstable or arthritic joints of the low back. TREATMENT  Treatment first involves the use of ice and medicine, to reduce pain and inflammation. The use of strengthening and stretching exercises may help reduce pain with activity. These exercises may be performed at home or with a therapist. Severe injuries may require referral to a therapist for further evaluation and treatment, such as ultrasound. Your caregiver may advise that you wear a back brace or corset, to help reduce pain and discomfort. Often, prolonged bed rest results in greater harm then benefit. Corticosteroid injections may be recommended. However, these should be reserved for the most serious cases. It is important to avoid using your back when lifting objects. At night, sleep on your back on a firm mattress, with a pillow placed under your knees. If non-surgical treatment is unsuccessful, surgery may be needed.  MEDICATION   If pain medicine is needed, nonsteroidal anti-inflammatory medicines (aspirin and ibuprofen), or other minor pain relievers (acetaminophen), are often advised.  Do not take pain medicine for 7 days before surgery.  Prescription pain relievers may be given, if your caregiver thinks they are needed. Use only as directed and only as much as you need.  Ointments applied to the skin may be helpful.  Corticosteroid injections may be given by your caregiver. These injections should be reserved for the most serious cases, because   they may only be given a certain number of times. HEAT AND COLD  Cold treatment (icing) should be applied for 10 to 15  minutes every 2 to 3 hours for inflammation and pain, and immediately after activity that aggravates your symptoms. Use ice packs or an ice massage.  Heat treatment may be used before performing stretching and strengthening activities prescribed by your caregiver, physical therapist, or athletic trainer. Use a heat pack or a warm water soak. SEEK MEDICAL CARE IF:   Symptoms get worse or do not improve in 2 to 4 weeks, despite treatment.  You develop numbness or weakness in either leg.  You lose bowel or bladder function.  Any of the following occur after surgery: fever, increased pain, swelling, redness, drainage of fluids, or bleeding in the affected area.  New, unexplained symptoms develop. (Drugs used in treatment may produce side effects.) EXERCISES  RANGE OF MOTION (ROM) AND STRETCHING EXERCISES - Low Back Sprain Most people with lower back pain will find that their symptoms get worse with excessive bending forward (flexion) or arching at the lower back (extension). The exercises that will help resolve your symptoms will focus on the opposite motion.  Your physician, physical therapist or athletic trainer will help you determine which exercises will be most helpful to resolve your lower back pain. Do not complete any exercises without first consulting with your caregiver. Discontinue any exercises which make your symptoms worse, until you speak to your caregiver. If you have pain, numbness or tingling which travels down into your buttocks, leg or foot, the goal of the therapy is for these symptoms to move closer to your back and eventually resolve. Sometimes, these leg symptoms will get better, but your lower back pain may worsen. This is often an indication of progress in your rehabilitation. Be very alert to any changes in your symptoms and the activities in which you participated in the 24 hours prior to the change. Sharing this information with your caregiver will allow him or her to most  efficiently treat your condition. These exercises may help you when beginning to rehabilitate your injury. Your symptoms may resolve with or without further involvement from your physician, physical therapist or athletic trainer. While completing these exercises, remember:   Restoring tissue flexibility helps normal motion to return to the joints. This allows healthier, less painful movement and activity.  An effective stretch should be held for at least 30 seconds.  A stretch should never be painful. You should only feel a gentle lengthening or release in the stretched tissue. FLEXION RANGE OF MOTION AND STRETCHING EXERCISES: STRETCH - Flexion, Single Knee to Chest   Lie on a firm bed or floor with both legs extended in front of you.  Keeping one leg in contact with the floor, bring your opposite knee to your chest. Hold your leg in place by either grabbing behind your thigh or at your knee.  Pull until you feel a gentle stretch in your low back. Hold __________ seconds.  Slowly release your grasp and repeat the exercise with the opposite side. Repeat __________ times. Complete this exercise __________ times per day.  STRETCH - Flexion, Double Knee to Chest  Lie on a firm bed or floor with both legs extended in front of you.  Keeping one leg in contact with the floor, bring your opposite knee to your chest.  Tense your stomach muscles to support your back and then lift your other knee to your chest. Hold your legs in   place by either grabbing behind your thighs or at your knees.  Pull both knees toward your chest until you feel a gentle stretch in your low back. Hold __________ seconds.  Tense your stomach muscles and slowly return one leg at a time to the floor. Repeat __________ times. Complete this exercise __________ times per day.  STRETCH - Low Trunk Rotation  Lie on a firm bed or floor. Keeping your legs in front of you, bend your knees so they are both pointed toward the  ceiling and your feet are flat on the floor.  Extend your arms out to the side. This will stabilize your upper body by keeping your shoulders in contact with the floor.  Gently and slowly drop both knees together to one side until you feel a gentle stretch in your low back. Hold for __________ seconds.  Tense your stomach muscles to support your lower back as you bring your knees back to the starting position. Repeat the exercise to the other side. Repeat __________ times. Complete this exercise __________ times per day  EXTENSION RANGE OF MOTION AND FLEXIBILITY EXERCISES: STRETCH - Extension, Prone on Elbows   Lie on your stomach on the floor, a bed will be too soft. Place your palms about shoulder width apart and at the height of your head.  Place your elbows under your shoulders. If this is too painful, stack pillows under your chest.  Allow your body to relax so that your hips drop lower and make contact more completely with the floor.  Hold this position for __________ seconds.  Slowly return to lying flat on the floor. Repeat __________ times. Complete this exercise __________ times per day.  RANGE OF MOTION - Extension, Prone Press Ups  Lie on your stomach on the floor, a bed will be too soft. Place your palms about shoulder width apart and at the height of your head.  Keeping your back as relaxed as possible, slowly straighten your elbows while keeping your hips on the floor. You may adjust the placement of your hands to maximize your comfort. As you gain motion, your hands will come more underneath your shoulders.  Hold this position __________ seconds.  Slowly return to lying flat on the floor. Repeat __________ times. Complete this exercise __________ times per day.  RANGE OF MOTION- Quadruped, Neutral Spine   Assume a hands and knees position on a firm surface. Keep your hands under your shoulders and your knees under your hips. You may place padding under your knees for  comfort.  Drop your head and point your tailbone toward the ground below you. This will round out your lower back like an angry cat. Hold this position for __________ seconds.  Slowly lift your head and release your tail bone so that your back sags into a large arch, like an old horse.  Hold this position for __________ seconds.  Repeat this until you feel limber in your low back.  Now, find your "sweet spot." This will be the most comfortable position somewhere between the two previous positions. This is your neutral spine. Once you have found this position, tense your stomach muscles to support your low back.  Hold this position for __________ seconds. Repeat __________ times. Complete this exercise __________ times per day.  STRENGTHENING EXERCISES - Low Back Sprain These exercises may help you when beginning to rehabilitate your injury. These exercises should be done near your "sweet spot." This is the neutral, low-back arch, somewhere between fully rounded and   fully arched, that is your least painful position. When performed in this safe range of motion, these exercises can be used for people who have either a flexion or extension based injury. These exercises may resolve your symptoms with or without further involvement from your physician, physical therapist or athletic trainer. While completing these exercises, remember:   Muscles can gain both the endurance and the strength needed for everyday activities through controlled exercises.  Complete these exercises as instructed by your physician, physical therapist or athletic trainer. Increase the resistance and repetitions only as guided.  You may experience muscle soreness or fatigue, but the pain or discomfort you are trying to eliminate should never worsen during these exercises. If this pain does worsen, stop and make certain you are following the directions exactly. If the pain is still present after adjustments, discontinue the  exercise until you can discuss the trouble with your caregiver. STRENGTHENING - Deep Abdominals, Pelvic Tilt   Lie on a firm bed or floor. Keeping your legs in front of you, bend your knees so they are both pointed toward the ceiling and your feet are flat on the floor.  Tense your lower abdominal muscles to press your low back into the floor. This motion will rotate your pelvis so that your tail bone is scooping upwards rather than pointing at your feet or into the floor. With a gentle tension and even breathing, hold this position for __________ seconds. Repeat __________ times. Complete this exercise __________ times per day.  STRENGTHENING - Abdominals, Crunches   Lie on a firm bed or floor. Keeping your legs in front of you, bend your knees so they are both pointed toward the ceiling and your feet are flat on the floor. Cross your arms over your chest.  Slightly tip your chin down without bending your neck.  Tense your abdominals and slowly lift your trunk high enough to just clear your shoulder blades. Lifting higher can put excessive stress on the lower back and does not further strengthen your abdominal muscles.  Control your return to the starting position. Repeat __________ times. Complete this exercise __________ times per day.  STRENGTHENING - Quadruped, Opposite UE/LE Lift   Assume a hands and knees position on a firm surface. Keep your hands under your shoulders and your knees under your hips. You may place padding under your knees for comfort.  Find your neutral spine and gently tense your abdominal muscles so that you can maintain this position. Your shoulders and hips should form a rectangle that is parallel with the floor and is not twisted.  Keeping your trunk steady, lift your right hand no higher than your shoulder and then your left leg no higher than your hip. Make sure you are not holding your breath. Hold this position for __________ seconds.  Continuing to keep  your abdominal muscles tense and your back steady, slowly return to your starting position. Repeat with the opposite arm and leg. Repeat __________ times. Complete this exercise __________ times per day.  STRENGTHENING - Abdominals and Quadriceps, Straight Leg Raise   Lie on a firm bed or floor with both legs extended in front of you.  Keeping one leg in contact with the floor, bend the other knee so that your foot can rest flat on the floor.  Find your neutral spine, and tense your abdominal muscles to maintain your spinal position throughout the exercise.  Slowly lift your straight leg off the floor about 6 inches for a count of   15, making sure to not hold your breath.  Still keeping your neutral spine, slowly lower your leg all the way to the floor. Repeat this exercise with each leg __________ times. Complete this exercise __________ times per day. POSTURE AND BODY MECHANICS CONSIDERATIONS - Low Back Sprain Keeping correct posture when sitting, standing or completing your activities will reduce the stress put on different body tissues, allowing injured tissues a chance to heal and limiting painful experiences. The following are general guidelines for improved posture. Your physician or physical therapist will provide you with any instructions specific to your needs. While reading these guidelines, remember:  The exercises prescribed by your provider will help you have the flexibility and strength to maintain correct postures.  The correct posture provides the best environment for your joints to work. All of your joints have less wear and tear when properly supported by a spine with good posture. This means you will experience a healthier, less painful body.  Correct posture must be practiced with all of your activities, especially prolonged sitting and standing. Correct posture is as important when doing repetitive low-stress activities (typing) as it is when doing a single heavy-load  activity (lifting). RESTING POSITIONS Consider which positions are most painful for you when choosing a resting position. If you have pain with flexion-based activities (sitting, bending, stooping, squatting), choose a position that allows you to rest in a less flexed posture. You would want to avoid curling into a fetal position on your side. If your pain worsens with extension-based activities (prolonged standing, working overhead), avoid resting in an extended position such as sleeping on your stomach. Most people will find more comfort when they rest with their spine in a more neutral position, neither too rounded nor too arched. Lying on a non-sagging bed on your side with a pillow between your knees, or on your back with a pillow under your knees will often provide some relief. Keep in mind, being in any one position for a prolonged period of time, no matter how correct your posture, can still lead to stiffness. PROPER SITTING POSTURE In order to minimize stress and discomfort on your spine, you must sit with correct posture. Sitting with good posture should be effortless for a healthy body. Returning to good posture is a gradual process. Many people can work toward this most comfortably by using various supports until they have the flexibility and strength to maintain this posture on their own. When sitting with proper posture, your ears will fall over your shoulders and your shoulders will fall over your hips. You should use the back of the chair to support your upper back. Your lower back will be in a neutral position, just slightly arched. You may place a small pillow or folded towel at the base of your lower back for  support.  When working at a desk, create an environment that supports good, upright posture. Without extra support, muscles tire, which leads to excessive strain on joints and other tissues. Keep these recommendations in mind: CHAIR:  A chair should be able to slide under your desk  when your back makes contact with the back of the chair. This allows you to work closely.  The chair's height should allow your eyes to be level with the upper part of your monitor and your hands to be slightly lower than your elbows. BODY POSITION  Your feet should make contact with the floor. If this is not possible, use a foot rest.  Keep your ears   over your shoulders. This will reduce stress on your neck and low back. INCORRECT SITTING POSTURES  If you are feeling tired and unable to assume a healthy sitting posture, do not slouch or slump. This puts excessive strain on your back tissues, causing more damage and pain. Healthier options include:  Using more support, like a lumbar pillow.  Switching tasks to something that requires you to be upright or walking.  Talking a brief walk.  Lying down to rest in a neutral-spine position. PROLONGED STANDING WHILE SLIGHTLY LEANING FORWARD  When completing a task that requires you to lean forward while standing in one place for a long time, place either foot up on a stationary 2-4 inch high object to help maintain the best posture. When both feet are on the ground, the lower back tends to lose its slight inward curve. If this curve flattens (or becomes too large), then the back and your other joints will experience too much stress, tire more quickly, and can cause pain. CORRECT STANDING POSTURES Proper standing posture should be assumed with all daily activities, even if they only take a few moments, like when brushing your teeth. As in sitting, your ears should fall over your shoulders and your shoulders should fall over your hips. You should keep a slight tension in your abdominal muscles to brace your spine. Your tailbone should point down to the ground, not behind your body, resulting in an over-extended swayback posture.  INCORRECT STANDING POSTURES  Common incorrect standing postures include a forward head, locked knees and/or an excessive  swayback. WALKING Walk with an upright posture. Your ears, shoulders and hips should all line-up. PROLONGED ACTIVITY IN A FLEXED POSITION When completing a task that requires you to bend forward at your waist or lean over a low surface, try to find a way to stabilize 3 out of 4 of your limbs. You can place a hand or elbow on your thigh or rest a knee on the surface you are reaching across. This will provide you more stability, so that your muscles do not tire as quickly. By keeping your knees relaxed, or slightly bent, you will also reduce stress across your lower back. CORRECT LIFTING TECHNIQUES DO :  Assume a wide stance. This will provide you more stability and the opportunity to get as close as possible to the object which you are lifting.  Tense your abdominals to brace your spine. Bend at the knees and hips. Keeping your back locked in a neutral-spine position, lift using your leg muscles. Lift with your legs, keeping your back straight.  Test the weight of unknown objects before attempting to lift them.  Try to keep your elbows locked down at your sides in order get the best strength from your shoulders when carrying an object.  Always ask for help when lifting heavy or awkward objects. INCORRECT LIFTING TECHNIQUES DO NOT:   Lock your knees when lifting, even if it is a small object.  Bend and twist. Pivot at your feet or move your feet when needing to change directions.  Assume that you can safely pick up even a paperclip without proper posture.   This information is not intended to replace advice given to you by your health care provider. Make sure you discuss any questions you have with your health care provider.   Document Released: 12/28/2004 Document Revised: 01/18/2014 Document Reviewed: 04/11/2008 [employer] Patient Education 2016 [employer]  

## 2015-07-22 NOTE — Progress Notes (Signed)
Subjective:    Patient ID: [name], female    DOB: December 16, 1950, 65 y.o.   MRN: [id number]  07/22/2015  Hypertension (follow up--not taking med--making her achy) and depression screening 9   HPI This 65 y.o. female presents for three month follow-up:  1.  Neck pain, shoulder pain, knee pain, hip pain:  Onset after starting blood pressure medication.  When stopped medication, all side effects improved.   S/p stress testing low risk.  Having some persistent lower back pain; +radiation; no n/t/w. No saddle paresthesias;no b/b dysfunction. Also having persistent anterior shin pain.  2.  Hypercholesterolemia: switched from Crestor to Atorvastatin; stopped all medication due to side effects. Stopped both medications five days ago; everything is better.  Fasting today.  Trying to change diet.    3.  HTN: stopped Losartan five days ago; BP at home ranging 150/88-127/77.    4.  Venous stasis: no DVT or blockage on lower extremity doppler ordered by cardiology; has suffered with asymmetric swelling of legs for twenty years.   Review of Systems  Constitutional: Negative for fever, chills, diaphoresis and fatigue.  Eyes: Negative for visual disturbance.  Respiratory: Negative for cough and shortness of breath.   Cardiovascular: Positive for leg swelling. Negative for chest pain and palpitations.  Gastrointestinal: Negative for nausea, vomiting, abdominal pain, diarrhea and constipation.  Endocrine: Negative for cold intolerance, heat intolerance, polydipsia, polyphagia and polyuria.  Musculoskeletal: Positive for arthralgias.  Neurological: Negative for dizziness, tremors, seizures, syncope, facial asymmetry, speech difficulty, weakness, light-headedness, numbness and headaches.    Past Medical History:  Diagnosis Date  . Allergy    Zyrtec PRN  . Anemia   . Cataract   . Colon polyp 03/11/2007  . Diverticulosis    by colonoscopy 2009  . Fibromyalgia    diagnosed by Dr. Teressa Lower.  s/p rheumatology consultation  . GERD (gastroesophageal reflux disease)   . Hyperlipidemia   . Hypertension   . IBS (irritable bowel syndrome)    diarrhea, constipation alternating; s/p GI consult.  . Internal hemorrhoid    colonoscopy in 2009   Past Surgical History:  Procedure Laterality Date  . CESAREAN SECTION    . COLONOSCOPY    . MYOMECTOMY    . TUBAL LIGATION    . UPPER GASTROINTESTINAL ENDOSCOPY     Allergies  Allergen Reactions  . Aspirin     My stomach burns and a "little bit of feeling like hard to breath".  . Pollen Extract     Skin itching, runny eyes, sinus pressure  . Sulfa Antibiotics     itching   Current Outpatient Prescriptions  Medication Sig Dispense Refill  . atorvastatin (LIPITOR) 40 MG tablet Take 1 tablet (40 mg total) by mouth daily. (Patient not taking: Reported on 07/22/2015) 30 tablet 11  . losartan (COZAAR) 50 MG tablet Take 1 tablet (50 mg total) by mouth daily. (Patient not taking: Reported on 07/22/2015) 90 tablet 1   No current facility-administered medications for this visit.    Social History   Social History  . Marital status: Single    Spouse name: N/A  . Number of children: N/A  . Years of education: N/A   Occupational History  . Not on file.   Social History Main Topics  . Smoking status: Former Smoker    Packs/day: 2.00    Years: 27.00    Quit date: 10/25/1994  . Smokeless tobacco: Never Used  . Alcohol use No  . Drug use:  No  . Sexual activity: Yes   Other Topics Concern  . Not on file   Social History Narrative   Marital status: married x 1969; happily married; no abuse     Children: 3 children; 1 grandchildren.      Lives: with husband, son      Employment:  Homemaker      Tobacco: quit smoking in 1997; smoked x 30 years.      Alcohol: none      Drugs: none      Exercise: none in 2017      Seatbelt: 100%     Guns:  None     ADLs: independent with ADLs; no assistant device      Advance Directives: none;  desires FULL CODE; no prolonged measures.   Family History  Problem Relation Age of Onset  . Hypertension Mother   . Stroke Mother     multiple CVAs/TIAs  . Heart disease Mother 62    CHF  . Cancer Father     prostate cancer  . Hypertension Sister   . Hyperlipidemia Sister   . Glaucoma Sister   . Hypertension Brother   . Prostatitis Brother   . Hypertension Maternal Grandmother   . Hypertension Maternal Grandfather   . Drug abuse Paternal Grandmother   . Hypertension Sister   . Mental illness Sister   . Glaucoma Sister   . Esophageal cancer Neg Hx   . Rectal cancer Neg Hx   . Stomach cancer Neg Hx   . Colon cancer Cousin   . Pancreatic cancer Cousin        Objective:    BP 132/88   Pulse 77   Temp 98.6 F (37 C) (Oral)   Resp 16   Ht 5\' 1"  (1.549 m)   Wt 170 lb 12.8 oz (77.5 kg)   SpO2 98%   BMI 32.27 kg/m  Physical Exam  Constitutional: She is oriented to person, place, and time. She appears well-developed and well-nourished. No distress.  HENT:  Head: Normocephalic and atraumatic.  Right Ear: External ear normal.  Left Ear: External ear normal.  Nose: Nose normal.  Mouth/Throat: Oropharynx is clear and moist.  Eyes: Conjunctivae and EOM are normal. Pupils are equal, round, and reactive to light.  Neck: Normal range of motion. Neck supple. Carotid bruit is not present. No thyromegaly present.  Cardiovascular: Normal rate, regular rhythm, normal heart sounds and intact distal pulses.  Exam reveals no gallop and no friction rub.   No murmur heard. Pulmonary/Chest: Effort normal and breath sounds normal. She has no wheezes. She has no rales.  Abdominal: Soft. Bowel sounds are normal. She exhibits no distension and no mass. There is no tenderness. There is no rebound and no guarding.  Musculoskeletal: She exhibits edema.  Lymphadenopathy:    She has no cervical adenopathy.  Neurological: She is alert and oriented to person, place, and time. No cranial nerve  deficit.  Skin: Skin is warm and dry. No rash noted. She is not diaphoretic. No erythema. No pallor.  Psychiatric: She has a normal mood and affect. Her behavior is normal.    Dg Lumbar Spine Complete  Result Date: 07/22/2015 CLINICAL DATA:  Chronic low back pain. No known injury. Initial encounter. EXAM: LUMBAR SPINE - COMPLETE 4+ VIEW COMPARISON:  None. FINDINGS: Vertebral body height is maintained. Mild convex left curvature is seen. Intervertebral disc space height is normal. Anterior endplate spurring is most notable at L3-4. There is some lower  lumbar facet degenerative change. No pars interarticularis defect is identified. Small calcified uterine fibroid is noted. IMPRESSION: No acute abnormality. Mild appearing degenerative change. Electronically Signed   By: Inge Rise M.D.   On: 07/22/2015 14:36   Dg Tibia/fibula Right  Result Date: 07/22/2015 CLINICAL DATA:  65 year old female with right lower leg pain EXAM: RIGHT TIBIA AND FIBULA - 2 VIEW COMPARISON:  None. FINDINGS: No acute bony abnormality. No significant degenerative changes of the knee. No radiopaque foreign body. Tortuous densities of the medial leg soft tissues, compatible with dilated superficial venous system/varicosities. IMPRESSION: Negative for acute bony abnormality. Evidence of engorgement of the superficial venous system of the lower extremity. Referral for evaluation of venous reflux/venous insufficiency may be useful given the patient's given history. Signed, Dulcy Fanny. Earleen Newport, DO [facility] Electronically Signed   By: Corrie Mckusick D.O.   On: 07/22/2015 14:36       Assessment & Plan:   1. Essential hypertension   2. Pure hypercholesterolemia   3. Glucose intolerance (impaired glucose tolerance)   4. Bilateral low back pain without sciatica   5. Injury of shin, right, initial encounter    -HTN well controlled with Losartan. -hyperlipidemia:  uncontrolled due to non-compliance with statin therapy;obtain labs. -glucose intolerance: obtain labs.  Continue with dietary modification. -B lower back pain: New; mild DDD; recommend stretches, heat. -R anterior shin: New.  S/p xray negative.  Recommend rest, elevation, ice to area.   Orders Placed This Encounter  Procedures  . DG Tibia/Fibula Right    Standing Status:   Future    Number of Occurrences:   1    Standing Expiration Date:   07/21/2016    Order Specific Question:   Reason for Exam (SYMPTOM  OR DIAGNOSIS REQUIRED)    Answer:   B chronic lower back pain    Order Specific Question:   Preferred imaging location?    Answer:   External  . DG Lumbar Spine Complete    Standing Status:   Future    Number of Occurrences:   1    Standing Expiration Date:   07/21/2016    Order Specific Question:   Reason for Exam (SYMPTOM  OR DIAGNOSIS REQUIRED)    Answer:   B chronic lower back pain    Order Specific Question:   Preferred imaging location?    Answer:   External  . CBC with Differential/Platelet  . Comprehensive metabolic panel    Order Specific Question:   Has the patient fasted?    Answer:   Yes  . Hemoglobin A1c  . Lipid panel    Order Specific Question:   Has the patient fasted?    Answer:   Yes   No orders of the defined types were placed in this encounter.   Return in about 9 months (around 04/21/2016) for complete physical examiniation.    Bexlee Bergdoll Elayne Guerin, M.D. [facility] [address] [place], [place]  [id number] [phone] phone [phone] fax

## 2015-08-10 DIAGNOSIS — R7302 Impaired glucose tolerance (oral): Secondary | ICD-10-CM | POA: Insufficient documentation

## 2015-08-10 DIAGNOSIS — E7800 Pure hypercholesterolemia, unspecified: Secondary | ICD-10-CM | POA: Insufficient documentation

## 2015-08-10 DIAGNOSIS — I10 Essential (primary) hypertension: Secondary | ICD-10-CM | POA: Insufficient documentation

## 2015-08-18 ENCOUNTER — Telehealth: Payer: Self-pay | Admitting: Cardiology

## 2015-08-18 NOTE — Telephone Encounter (Signed)
New message        Pt c/o medication issue:  1. Name of Medication: atorvastatin 2. How are you currently taking this medication (dosage and times per day)? 40 mg daily 3. Are you having a reaction (difficulty breathing--STAT)? no 4. What is your medication issue?  Pt cannot take medication because of severe joint/muscle pain.  Should she cancel her lipid/liver lab appt?

## 2015-08-18 NOTE — Telephone Encounter (Signed)
Per pt call - states she had a lot of muscle pain especially in her legs, neck and jawbone while taking Atorvastatin.  She has stopped it.  I will forward to Dr Marlou Porch for review.  Aware I will c/b with any new orders or recommendations.

## 2015-08-19 NOTE — Telephone Encounter (Signed)
Start pravastatin 20mg  PO QD Candee Furbish, MD

## 2015-08-20 ENCOUNTER — Other Ambulatory Visit: Payer: Medicare Other

## 2015-08-21 ENCOUNTER — Other Ambulatory Visit: Payer: Self-pay | Admitting: *Deleted

## 2015-08-21 MED ORDER — PRAVASTATIN SODIUM 20 MG PO TABS: 20.0000 mg | ORAL_TABLET | Freq: Every evening | ORAL | 3 refills | Status: DC

## 2015-08-21 NOTE — Telephone Encounter (Signed)
PT  NOTIFIED   AND IS  WILLING  TO TRY NEW  MED  IF  AFFORDABLE .[name]

## 2015-09-18 ENCOUNTER — Encounter: Payer: Self-pay | Admitting: Family Medicine

## 2015-09-20 ENCOUNTER — Other Ambulatory Visit: Payer: Self-pay | Admitting: Family Medicine

## 2015-11-08 ENCOUNTER — Telehealth: Payer: Self-pay

## 2015-11-08 NOTE — Telephone Encounter (Signed)
Best number [phone]  Pt is needing something called in for back spasms

## 2015-11-10 ENCOUNTER — Ambulatory Visit (INDEPENDENT_AMBULATORY_CARE_PROVIDER_SITE_OTHER): Payer: Medicare Other | Admitting: Family Medicine

## 2015-11-10 VITALS — BP 142/90 | HR 76 | Temp 98.2°F | Resp 17 | Ht 61.0 in | Wt 168.0 lb

## 2015-11-10 DIAGNOSIS — Z23 Encounter for immunization: Secondary | ICD-10-CM

## 2015-11-10 DIAGNOSIS — S335XXA Sprain of ligaments of lumbar spine, initial encounter: Secondary | ICD-10-CM | POA: Diagnosis not present

## 2015-11-10 DIAGNOSIS — I83891 Varicose veins of right lower extremities with other complications: Secondary | ICD-10-CM | POA: Diagnosis not present

## 2015-11-10 MED ORDER — TIZANIDINE HCL 2 MG PO CAPS: 2.0000 mg | ORAL_CAPSULE | Freq: Three times a day (TID) | ORAL | 0 refills | Status: DC | PRN

## 2015-11-10 MED ORDER — TRAMADOL HCL 50 MG PO TABS: 50.0000 mg | ORAL_TABLET | Freq: Three times a day (TID) | ORAL | 0 refills | Status: DC | PRN

## 2015-11-10 MED ORDER — MELOXICAM 7.5 MG PO TABS: 7.5000 mg | ORAL_TABLET | Freq: Every day | ORAL | 0 refills | Status: DC

## 2015-11-10 NOTE — Telephone Encounter (Signed)
Pt scheduled appt for 10/30

## 2015-11-10 NOTE — Patient Instructions (Addendum)
   IF you received an x-ray today, you will receive an invoice from [facility]. Please contact [facility] at [phone] with questions or concerns regarding your invoice.   IF you received labwork today, you will receive an invoice from [facility]. Please contact [facility] at [phone] with questions or concerns regarding your invoice.   Our billing staff will not be able to assist you with questions regarding bills from these companies.  You will be contacted with the lab results as soon as they are available. The fastest way to get your results is to activate your My Chart account. Instructions are located on the last page of this paperwork. If you have not heard from us regarding the results in 2 weeks, please contact this office.     Low Back Sprain With Rehab A sprain is an injury in which a ligament is torn. The ligaments of the lower back are vulnerable to sprains. However, they are strong and require great force to be injured. These ligaments are important for stabilizing the spinal column. Sprains are classified into three categories. Grade 1 sprains cause pain, but the tendon is not lengthened. Grade 2 sprains include a lengthened ligament, due to the ligament being stretched or partially ruptured. With grade 2 sprains there is still function, although the function may be decreased. Grade 3 sprains involve a complete tear of the tendon or muscle, and function is usually impaired. SYMPTOMS   Severe pain in the lower back.  Sometimes, a feeling of a "pop," "snap," or tear, at the time of injury.  Tenderness and sometimes swelling at the injury site.  Uncommonly, bruising (contusion) within 48 hours of injury.  Muscle spasms in the back. CAUSES  Low back sprains occur when a force is placed on the ligaments that is greater than they can handle. Common causes of injury include:  Performing a stressful act while  off-balance.  Repetitive stressful activities that involve movement of the lower back.  Direct hit (trauma) to the lower back. RISK INCREASES WITH:  Contact sports (football, wrestling).  Collisions (major skiing accidents).  Sports that require throwing or lifting (baseball, weightlifting).  Sports involving twisting of the spine (gymnastics, diving, tennis, golf).  Poor strength and flexibility.  Inadequate protection.  Previous back injury or surgery (especially fusion). PREVENTION  Wear properly fitted and padded protective equipment.  Warm up and stretch properly before activity.  Allow for adequate recovery between workouts.  Maintain physical fitness:  Strength, flexibility, and endurance.  Cardiovascular fitness.  Maintain a healthy body weight. PROGNOSIS  If treated properly, low back sprains usually heal with non-surgical treatment. The length of time for healing depends on the severity of the injury.  RELATED COMPLICATIONS   Recurring symptoms, resulting in a chronic problem.  Chronic inflammation and pain in the low back.  Delayed healing or resolution of symptoms, especially if activity is resumed too soon.  Prolonged impairment.  Unstable or arthritic joints of the low back. TREATMENT  Treatment first involves the use of ice and medicine, to reduce pain and inflammation. The use of strengthening and stretching exercises may help reduce pain with activity. These exercises may be performed at home or with a therapist. Severe injuries may require referral to a therapist for further evaluation and treatment, such as ultrasound. Your caregiver may advise that you wear a back brace or corset, to help reduce pain and discomfort. Often, prolonged bed rest results in greater harm then benefit. Corticosteroid injections may   be recommended. However, these should be reserved for the most serious cases. It is important to avoid using your back when lifting objects.  At night, sleep on your back on a firm mattress, with a pillow placed under your knees. If non-surgical treatment is unsuccessful, surgery may be needed.  MEDICATION   If pain medicine is needed, nonsteroidal anti-inflammatory medicines (aspirin and ibuprofen), or other minor pain relievers (acetaminophen), are often advised.  Do not take pain medicine for 7 days before surgery.  Prescription pain relievers may be given, if your caregiver thinks they are needed. Use only as directed and only as much as you need.  Ointments applied to the skin may be helpful.  Corticosteroid injections may be given by your caregiver. These injections should be reserved for the most serious cases, because they may only be given a certain number of times. HEAT AND COLD  Cold treatment (icing) should be applied for 10 to 15 minutes every 2 to 3 hours for inflammation and pain, and immediately after activity that aggravates your symptoms. Use ice packs or an ice massage.  Heat treatment may be used before performing stretching and strengthening activities prescribed by your caregiver, physical therapist, or athletic trainer. Use a heat pack or a warm water soak. SEEK MEDICAL CARE IF:   Symptoms get worse or do not improve in 2 to 4 weeks, despite treatment.  You develop numbness or weakness in either leg.  You lose bowel or bladder function.  Any of the following occur after surgery: fever, increased pain, swelling, redness, drainage of fluids, or bleeding in the affected area.  New, unexplained symptoms develop. (Drugs used in treatment may produce side effects.) EXERCISES  RANGE OF MOTION (ROM) AND STRETCHING EXERCISES - Low Back Sprain Most people with lower back pain will find that their symptoms get worse with excessive bending forward (flexion) or arching at the lower back (extension). The exercises that will help resolve your symptoms will focus on the opposite motion.  Your physician, physical  therapist or athletic trainer will help you determine which exercises will be most helpful to resolve your lower back pain. Do not complete any exercises without first consulting with your caregiver. Discontinue any exercises which make your symptoms worse, until you speak to your caregiver. If you have pain, numbness or tingling which travels down into your buttocks, leg or foot, the goal of the therapy is for these symptoms to move closer to your back and eventually resolve. Sometimes, these leg symptoms will get better, but your lower back pain may worsen. This is often an indication of progress in your rehabilitation. Be very alert to any changes in your symptoms and the activities in which you participated in the 24 hours prior to the change. Sharing this information with your caregiver will allow him or her to most efficiently treat your condition. These exercises may help you when beginning to rehabilitate your injury. Your symptoms may resolve with or without further involvement from your physician, physical therapist or athletic trainer. While completing these exercises, remember:   Restoring tissue flexibility helps normal motion to return to the joints. This allows healthier, less painful movement and activity.  An effective stretch should be held for at least 30 seconds.  A stretch should never be painful. You should only feel a gentle lengthening or release in the stretched tissue. FLEXION RANGE OF MOTION AND STRETCHING EXERCISES: STRETCH - Flexion, Single Knee to Chest   Lie on a firm bed or floor with   both legs extended in front of you.  Keeping one leg in contact with the floor, bring your opposite knee to your chest. Hold your leg in place by either grabbing behind your thigh or at your knee.  Pull until you feel a gentle stretch in your low back. Hold __________ seconds.  Slowly release your grasp and repeat the exercise with the opposite side. Repeat __________ times. Complete  this exercise __________ times per day.  STRETCH - Flexion, Double Knee to Chest  Lie on a firm bed or floor with both legs extended in front of you.  Keeping one leg in contact with the floor, bring your opposite knee to your chest.  Tense your stomach muscles to support your back and then lift your other knee to your chest. Hold your legs in place by either grabbing behind your thighs or at your knees.  Pull both knees toward your chest until you feel a gentle stretch in your low back. Hold __________ seconds.  Tense your stomach muscles and slowly return one leg at a time to the floor. Repeat __________ times. Complete this exercise __________ times per day.  STRETCH - Low Trunk Rotation  Lie on a firm bed or floor. Keeping your legs in front of you, bend your knees so they are both pointed toward the ceiling and your feet are flat on the floor.  Extend your arms out to the side. This will stabilize your upper body by keeping your shoulders in contact with the floor.  Gently and slowly drop both knees together to one side until you feel a gentle stretch in your low back. Hold for __________ seconds.  Tense your stomach muscles to support your lower back as you bring your knees back to the starting position. Repeat the exercise to the other side. Repeat __________ times. Complete this exercise __________ times per day  EXTENSION RANGE OF MOTION AND FLEXIBILITY EXERCISES: STRETCH - Extension, Prone on Elbows   Lie on your stomach on the floor, a bed will be too soft. Place your palms about shoulder width apart and at the height of your head.  Place your elbows under your shoulders. If this is too painful, stack pillows under your chest.  Allow your body to relax so that your hips drop lower and make contact more completely with the floor.  Hold this position for __________ seconds.  Slowly return to lying flat on the floor. Repeat __________ times. Complete this exercise  __________ times per day.  RANGE OF MOTION - Extension, Prone Press Ups  Lie on your stomach on the floor, a bed will be too soft. Place your palms about shoulder width apart and at the height of your head.  Keeping your back as relaxed as possible, slowly straighten your elbows while keeping your hips on the floor. You may adjust the placement of your hands to maximize your comfort. As you gain motion, your hands will come more underneath your shoulders.  Hold this position __________ seconds.  Slowly return to lying flat on the floor. Repeat __________ times. Complete this exercise __________ times per day.  RANGE OF MOTION- Quadruped, Neutral Spine   Assume a hands and knees position on a firm surface. Keep your hands under your shoulders and your knees under your hips. You may place padding under your knees for comfort.  Drop your head and point your tailbone toward the ground below you. This will round out your lower back like an angry cat. Hold this position   for __________ seconds.  Slowly lift your head and release your tail bone so that your back sags into a large arch, like an old horse.  Hold this position for __________ seconds.  Repeat this until you feel limber in your low back.  Now, find your "sweet spot." This will be the most comfortable position somewhere between the two previous positions. This is your neutral spine. Once you have found this position, tense your stomach muscles to support your low back.  Hold this position for __________ seconds. Repeat __________ times. Complete this exercise __________ times per day.  STRENGTHENING EXERCISES - Low Back Sprain These exercises may help you when beginning to rehabilitate your injury. These exercises should be done near your "sweet spot." This is the neutral, low-back arch, somewhere between fully rounded and fully arched, that is your least painful position. When performed in this safe range of motion, these exercises  can be used for people who have either a flexion or extension based injury. These exercises may resolve your symptoms with or without further involvement from your physician, physical therapist or athletic trainer. While completing these exercises, remember:   Muscles can gain both the endurance and the strength needed for everyday activities through controlled exercises.  Complete these exercises as instructed by your physician, physical therapist or athletic trainer. Increase the resistance and repetitions only as guided.  You may experience muscle soreness or fatigue, but the pain or discomfort you are trying to eliminate should never worsen during these exercises. If this pain does worsen, stop and make certain you are following the directions exactly. If the pain is still present after adjustments, discontinue the exercise until you can discuss the trouble with your caregiver. STRENGTHENING - Deep Abdominals, Pelvic Tilt   Lie on a firm bed or floor. Keeping your legs in front of you, bend your knees so they are both pointed toward the ceiling and your feet are flat on the floor.  Tense your lower abdominal muscles to press your low back into the floor. This motion will rotate your pelvis so that your tail bone is scooping upwards rather than pointing at your feet or into the floor. With a gentle tension and even breathing, hold this position for __________ seconds. Repeat __________ times. Complete this exercise __________ times per day.  STRENGTHENING - Abdominals, Crunches   Lie on a firm bed or floor. Keeping your legs in front of you, bend your knees so they are both pointed toward the ceiling and your feet are flat on the floor. Cross your arms over your chest.  Slightly tip your chin down without bending your neck.  Tense your abdominals and slowly lift your trunk high enough to just clear your shoulder blades. Lifting higher can put excessive stress on the lower back and does not  further strengthen your abdominal muscles.  Control your return to the starting position. Repeat __________ times. Complete this exercise __________ times per day.  STRENGTHENING - Quadruped, Opposite UE/LE Lift   Assume a hands and knees position on a firm surface. Keep your hands under your shoulders and your knees under your hips. You may place padding under your knees for comfort.  Find your neutral spine and gently tense your abdominal muscles so that you can maintain this position. Your shoulders and hips should form a rectangle that is parallel with the floor and is not twisted.  Keeping your trunk steady, lift your right hand no higher than your shoulder and then your left   leg no higher than your hip. Make sure you are not holding your breath. Hold this position for __________ seconds.  Continuing to keep your abdominal muscles tense and your back steady, slowly return to your starting position. Repeat with the opposite arm and leg. Repeat __________ times. Complete this exercise __________ times per day.  STRENGTHENING - Abdominals and Quadriceps, Straight Leg Raise   Lie on a firm bed or floor with both legs extended in front of you.  Keeping one leg in contact with the floor, bend the other knee so that your foot can rest flat on the floor.  Find your neutral spine, and tense your abdominal muscles to maintain your spinal position throughout the exercise.  Slowly lift your straight leg off the floor about 6 inches for a count of 15, making sure to not hold your breath.  Still keeping your neutral spine, slowly lower your leg all the way to the floor. Repeat this exercise with each leg __________ times. Complete this exercise __________ times per day. POSTURE AND BODY MECHANICS CONSIDERATIONS - Low Back Sprain Keeping correct posture when sitting, standing or completing your activities will reduce the stress put on different body tissues, allowing injured tissues a chance to heal  and limiting painful experiences. The following are general guidelines for improved posture. Your physician or physical therapist will provide you with any instructions specific to your needs. While reading these guidelines, remember:  The exercises prescribed by your provider will help you have the flexibility and strength to maintain correct postures.  The correct posture provides the best environment for your joints to work. All of your joints have less wear and tear when properly supported by a spine with good posture. This means you will experience a healthier, less painful body.  Correct posture must be practiced with all of your activities, especially prolonged sitting and standing. Correct posture is as important when doing repetitive low-stress activities (typing) as it is when doing a single heavy-load activity (lifting). RESTING POSITIONS Consider which positions are most painful for you when choosing a resting position. If you have pain with flexion-based activities (sitting, bending, stooping, squatting), choose a position that allows you to rest in a less flexed posture. You would want to avoid curling into a fetal position on your side. If your pain worsens with extension-based activities (prolonged standing, working overhead), avoid resting in an extended position such as sleeping on your stomach. Most people will find more comfort when they rest with their spine in a more neutral position, neither too rounded nor too arched. Lying on a non-sagging bed on your side with a pillow between your knees, or on your back with a pillow under your knees will often provide some relief. Keep in mind, being in any one position for a prolonged period of time, no matter how correct your posture, can still lead to stiffness. PROPER SITTING POSTURE In order to minimize stress and discomfort on your spine, you must sit with correct posture. Sitting with good posture should be effortless for a healthy body.  Returning to good posture is a gradual process. Many people can work toward this most comfortably by using various supports until they have the flexibility and strength to maintain this posture on their own. When sitting with proper posture, your ears will fall over your shoulders and your shoulders will fall over your hips. You should use the back of the chair to support your upper back. Your lower back will be in a neutral   position, just slightly arched. You may place a small pillow or folded towel at the base of your lower back for  support.  When working at a desk, create an environment that supports good, upright posture. Without extra support, muscles tire, which leads to excessive strain on joints and other tissues. Keep these recommendations in mind: CHAIR:  A chair should be able to slide under your desk when your back makes contact with the back of the chair. This allows you to work closely.  The chair's height should allow your eyes to be level with the upper part of your monitor and your hands to be slightly lower than your elbows. BODY POSITION  Your feet should make contact with the floor. If this is not possible, use a foot rest.  Keep your ears over your shoulders. This will reduce stress on your neck and low back. INCORRECT SITTING POSTURES  If you are feeling tired and unable to assume a healthy sitting posture, do not slouch or slump. This puts excessive strain on your back tissues, causing more damage and pain. Healthier options include:  Using more support, like a lumbar pillow.  Switching tasks to something that requires you to be upright or walking.  Talking a brief walk.  Lying down to rest in a neutral-spine position. PROLONGED STANDING WHILE SLIGHTLY LEANING FORWARD  When completing a task that requires you to lean forward while standing in one place for a long time, place either foot up on a stationary 2-4 inch high object to help maintain the best posture. When  both feet are on the ground, the lower back tends to lose its slight inward curve. If this curve flattens (or becomes too large), then the back and your other joints will experience too much stress, tire more quickly, and can cause pain. CORRECT STANDING POSTURES Proper standing posture should be assumed with all daily activities, even if they only take a few moments, like when brushing your teeth. As in sitting, your ears should fall over your shoulders and your shoulders should fall over your hips. You should keep a slight tension in your abdominal muscles to brace your spine. Your tailbone should point down to the ground, not behind your body, resulting in an over-extended swayback posture.  INCORRECT STANDING POSTURES  Common incorrect standing postures include a forward head, locked knees and/or an excessive swayback. WALKING Walk with an upright posture. Your ears, shoulders and hips should all line-up. PROLONGED ACTIVITY IN A FLEXED POSITION When completing a task that requires you to bend forward at your waist or lean over a low surface, try to find a way to stabilize 3 out of 4 of your limbs. You can place a hand or elbow on your thigh or rest a knee on the surface you are reaching across. This will provide you more stability, so that your muscles do not tire as quickly. By keeping your knees relaxed, or slightly bent, you will also reduce stress across your lower back. CORRECT LIFTING TECHNIQUES DO :  Assume a wide stance. This will provide you more stability and the opportunity to get as close as possible to the object which you are lifting.  Tense your abdominals to brace your spine. Bend at the knees and hips. Keeping your back locked in a neutral-spine position, lift using your leg muscles. Lift with your legs, keeping your back straight.  Test the weight of unknown objects before attempting to lift them.  Try to keep your elbows locked down   down at your sides in order get the best  strength from your shoulders when carrying an object.  Always ask for help when lifting heavy or awkward objects. INCORRECT LIFTING TECHNIQUES DO NOT:   Lock your knees when lifting, even if it is a small object.  Bend and twist. Pivot at your feet or move your feet when needing to change directions.  Assume that you can safely pick up even a paperclip without proper posture.   This information is not intended to replace advice given to you by your health care provider. Make sure you discuss any questions you have with your health care provider.   Document Released: 12/28/2004 Document Revised: 01/18/2014 Document Reviewed: 04/11/2008 Elsevier Interactive Patient Education 2016 [employer] Influenza (Flu) Vaccine (Inactivated or Recombinant):  1. Why get vaccinated? Influenza ("flu") is a contagious disease that spreads around the [place] every year, usually between October and May. Flu is caused by influenza viruses, and is spread mainly by coughing, sneezing, and close contact. Anyone can get flu. Flu strikes suddenly and can last several days. Symptoms vary by age, but can include:  fever/chills  sore throat  muscle aches  fatigue  cough  headache  runny or stuffy nose Flu can also lead to pneumonia and blood infections, and cause diarrhea and seizures in children. If you have a medical condition, such as heart or lung disease, flu can make it worse. Flu is more dangerous for some people. Infants and young children, people 68 years of age and older, pregnant women, and people with certain health conditions or a weakened immune system are at greatest risk. Each year thousands of people in the [place] die from flu, and many more are hospitalized. Flu vaccine can:  keep you from getting flu,  make flu less severe if you do get it, and  keep you from spreading flu to your family and other people. 2. Inactivated and recombinant flu vaccines A dose of flu  vaccine is recommended every flu season. Children 6 months through 98 years of age may need two doses during the same flu season. Everyone else needs only one dose each flu season. Some inactivated flu vaccines contain a very small amount of a mercury-based preservative called thimerosal. Studies have not shown thimerosal in vaccines to be harmful, but flu vaccines that do not contain thimerosal are available. There is no live flu virus in flu shots. They cannot cause the flu. There are many flu viruses, and they are always changing. Each year a new flu vaccine is made to protect against three or four viruses that are likely to cause disease in the upcoming flu season. But even when the vaccine doesn't exactly match these viruses, it may still provide some protection. Flu vaccine cannot prevent:  flu that is caused by a virus not covered by the vaccine, or  illnesses that look like flu but are not. It takes about 2 weeks for protection to develop after vaccination, and protection lasts through the flu season. 3. Some people should not get this vaccine Tell the person who is giving you the vaccine:  If you have any severe, life-threatening allergies. If you ever had a life-threatening allergic reaction after a dose of flu vaccine, or have a severe allergy to any part of this vaccine, you may be advised not to get vaccinated. Most, but not all, types of flu vaccine contain a small amount of egg protein.  If you ever had Guillain-Barre Syndrome (also called  GBS). Some people with a history of GBS should not get this vaccine. This should be discussed with your doctor.  If you are not feeling well. It is usually okay to get flu vaccine when you have a mild illness, but you might be asked to come back when you feel better. 4. Risks of a vaccine reaction With any medicine, including vaccines, there is a chance of reactions. These are usually mild and go away on their own, but serious reactions are also  possible. Most people who get a flu shot do not have any problems with it. Minor problems following a flu shot include:  soreness, redness, or swelling where the shot was given  hoarseness  sore, red or itchy eyes  cough  fever  aches  headache  itching  fatigue If these problems occur, they usually begin soon after the shot and last 1 or 2 days. More serious problems following a flu shot can include the following:  There may be a small increased risk of Guillain-Barre Syndrome (GBS) after inactivated flu vaccine. This risk has been estimated at 1 or 2 additional cases per million people vaccinated. This is much lower than the risk of severe complications from flu, which can be prevented by flu vaccine.  Young children who get the flu shot along with pneumococcal vaccine (PCV13) and/or DTaP vaccine at the same time might be slightly more likely to have a seizure caused by fever. Ask your doctor for more information. Tell your doctor if a child who is getting flu vaccine has ever had a seizure. Problems that could happen after any injected vaccine:  People sometimes faint after a medical procedure, including vaccination. Sitting or lying down for about 15 minutes can help prevent fainting, and injuries caused by a fall. Tell your doctor if you feel dizzy, or have vision changes or ringing in the ears.  Some people get severe pain in the shoulder and have difficulty moving the arm where a shot was given. This happens very rarely.  Any medication can cause a severe allergic reaction. Such reactions from a vaccine are very rare, estimated at about 1 in a million doses, and would happen within a few minutes to a few hours after the vaccination. As with any medicine, there is a very remote chance of a vaccine causing a serious injury or death. The safety of vaccines is always being monitored. For more information, visit: http://www.aguilar.org/ 5. What if there is a serious  reaction? What should I look for?  Look for anything that concerns you, such as signs of a severe allergic reaction, very high fever, or unusual behavior. Signs of a severe allergic reaction can include hives, swelling of the face and throat, difficulty breathing, a fast heartbeat, dizziness, and weakness. These would start a few minutes to a few hours after the vaccination. What should I do?  If you think it is a severe allergic reaction or other emergency that can't wait, call 9-1-1 and get the person to the nearest hospital. Otherwise, call your doctor.  Reactions should be reported to the Vaccine Adverse Event Reporting System (VAERS). Your doctor should file this report, or you can do it yourself through the VAERS web site at www.vaers.SamedayNews.es, or by calling (972)711-8751. VAERS does not give medical advice. 6. The National Vaccine Injury Compensation Program The [employer] Vaccine Injury Compensation Program (VICP) is a federal program that was created to compensate people who may have been injured by certain vaccines. Persons who believe they  may have been injured by a vaccine can learn about the program and about filing a claim by calling 331-846-2050 or visiting the [place] website at GoldCloset.com.ee. There is a time limit to file a claim for compensation. 7. How can I learn more?  Ask your healthcare provider. He or she can give you the vaccine package insert or suggest other sources of information.  Call your local or state health department.  Contact the Centers for Disease Control and Prevention (CDC):  Call 860-094-5157 (1-800-CDC-INFO) or  Visit CDC's website at https://gibson.com/ Vaccine Information Statement Inactivated Influenza Vaccine (08/17/2013)   This information is not intended to replace advice given to you by your health care provider. Make sure you discuss any questions you have with your health care provider.   Document Released: 10/22/2005  Document Revised: 01/18/2014 Document Reviewed: 08/20/2013 Elsevier Interactive Patient Education [employer].

## 2015-11-10 NOTE — Progress Notes (Signed)
Subjective:    Patient ID: [name], female    DOB: 31-Jan-1950, 65 y.o.   MRN: [id number]  11/10/2015  Back Pain (and spasms x 1week )   HPI This 65 y.o. female presents for evaluation of back pain for the past week.  Bent the wrong the way last week; not picking up anything; lifting laundry basket; bend the wrong way; acute onset of back pain.B lower back pain and in middle.  B radiation into B thighs; B toes numb.  Normal b/b function.  Intermittent paresthesias with worsening back pain or when hemorrhoids swollen badly.  Has been taking Tylenol when cannot tolerate pain; 2 at nighttime.  Can take Ibuprofen but will start having stomach upset.    S/p lumbar spine films 07/22/15; mild L curvature convex; spurring L3-4; facet degenerative changes present but mild.   Also has suffered with chronic varicose veins in R leg especially; suffers with intermittent pain along varicosities.  Can also suffer with intermittent leg swelling.    Review of Systems  Constitutional: Negative for chills, diaphoresis, fatigue and fever.  HENT: Negative for ear pain, postnasal drip, rhinorrhea, sinus pressure, sore throat and trouble swallowing.   Respiratory: Negative for cough and shortness of breath.   Cardiovascular: Negative for chest pain, palpitations and leg swelling.  Gastrointestinal: Negative for abdominal pain, constipation, diarrhea, nausea and vomiting.  Genitourinary: Negative for decreased urine volume and difficulty urinating.  Musculoskeletal: Positive for back pain and myalgias.  Neurological: Negative for weakness and numbness.    Past Medical History:  Diagnosis Date  . Allergy    Zyrtec PRN  . Anemia   . Cataract   . Colon polyp 03/11/2007  . Diverticulosis    by colonoscopy 2009  . Fibromyalgia    diagnosed by Dr. Teressa Lower. s/p rheumatology consultation  . GERD (gastroesophageal reflux disease)   . Hyperlipidemia   . Hypertension   . IBS (irritable bowel  syndrome)    diarrhea, constipation alternating; s/p GI consult.  . Internal hemorrhoid    colonoscopy in 2009   Past Surgical History:  Procedure Laterality Date  . CESAREAN SECTION    . COLONOSCOPY    . MYOMECTOMY    . TUBAL LIGATION    . UPPER GASTROINTESTINAL ENDOSCOPY     Allergies  Allergen Reactions  . Aspirin     My stomach burns and a "little bit of feeling like hard to breath".  . Pollen Extract     Skin itching, runny eyes, sinus pressure  . Sulfa Antibiotics     itching    Social History   Social History  . Marital status: Single    Spouse name: N/A  . Number of children: N/A  . Years of education: N/A   Occupational History  . Not on file.   Social History Main Topics  . Smoking status: Former Smoker    Packs/day: 2.00    Years: 27.00    Quit date: 10/25/1994  . Smokeless tobacco: Never Used  . Alcohol use No  . Drug use: No  . Sexual activity: Yes   Other Topics Concern  . Not on file   Social History Narrative   Marital status: married x 1969; happily married; no abuse     Children: 3 children; 1 grandchildren.      Lives: with husband, son      Employment:  Homemaker      Tobacco: quit smoking in 1997; smoked x 30 years.  Alcohol: none      Drugs: none      Exercise: none in 2017      Seatbelt: 100%     Guns:  None     ADLs: independent with ADLs; no assistant device      Advance Directives: none; desires FULL CODE; no prolonged measures.   Family History  Problem Relation Age of Onset  . Hypertension Mother   . Stroke Mother     multiple CVAs/TIAs  . Heart disease Mother 45    CHF  . Cancer Father     prostate cancer  . Hypertension Sister   . Hyperlipidemia Sister   . Glaucoma Sister   . Hypertension Brother   . Prostatitis Brother   . Hypertension Maternal Grandmother   . Hypertension Maternal Grandfather   . Drug abuse Paternal Grandmother   . Hypertension Sister   . Mental illness Sister   . Glaucoma Sister   .  Esophageal cancer Neg Hx   . Rectal cancer Neg Hx   . Stomach cancer Neg Hx   . Colon cancer Cousin   . Pancreatic cancer Cousin        Objective:    BP (!) 142/90 (BP Location: Left Arm, Patient Position: Sitting, Cuff Size: Large)   Pulse 76   Temp 98.2 F (36.8 C) (Oral)   Resp 17   Ht 5\' 1"  (1.549 m)   Wt 168 lb (76.2 kg)   SpO2 97%   BMI 31.74 kg/m  Physical Exam  Constitutional: She is oriented to person, place, and time. She appears well-developed and well-nourished. No distress.  HENT:  Head: Normocephalic and atraumatic.  Eyes: Conjunctivae are normal. Pupils are equal, round, and reactive to light.  Neck: Normal range of motion. Neck supple.  Cardiovascular: Normal rate, regular rhythm and normal heart sounds.  Exam reveals no gallop and no friction rub.   No murmur heard. +varicose veins R lower extremity with minimal edema present.  Pulmonary/Chest: Effort normal and breath sounds normal. She has no wheezes. She has no rales.  Musculoskeletal: She exhibits no edema.       Lumbar back: She exhibits decreased range of motion, tenderness and pain. She exhibits no bony tenderness, no spasm and normal pulse.  Lumbar spine:  Non-tender midline; +tender paraspinal regions B.  Straight leg raises negative B; toe and heel walking intact; marching intact; motor 5/5 BLE.  Decreased ROM lumbar spine.   Neurological: She is alert and oriented to person, place, and time.  Skin: She is not diaphoretic.  Psychiatric: She has a normal mood and affect. Her behavior is normal.  Nursing note and vitals reviewed.  Depression screen [facility] 2/9 11/10/2015 07/22/2015 03/18/2015  Decreased Interest 0 1 0  Down, Depressed, Hopeless 0 1 0  PHQ - 2 Score 0 2 0  Altered sleeping - 3 -  Tired, decreased energy - 1 -  Change in appetite - 0 -  Feeling bad or failure about yourself  - 0 -  Trouble concentrating - 3 -  Moving slowly or fidgety/restless - 0 -  Suicidal thoughts - 0 -  PHQ-9  Score - 9 -        Assessment & Plan:   1. Lumbar sprain, initial encounter   2. Symptomatic varicose veins, right   3. Need for prophylactic vaccination and inoculation against influenza    -New lumbar sprain; rx for Zanaflex, Mobic, and Tramadol provided; home exercise program also provided to perform daily.  If no improvement in 3 weeks, call for ortho referral. -refer to vascular surgery to discuss treatment options for chronic varicose veins R. -s/p flu vaccine.   Orders Placed This Encounter  Procedures  . Flu Vaccine QUAD 36+ mos IM  . Ambulatory referral to Vascular Surgery    Referral Priority:   Routine    Referral Type:   Surgical    Referral Reason:   Specialty Services Required    Requested Specialty:   Vascular Surgery    Number of Visits Requested:   1   Meds ordered this encounter  Medications  . tizanidine (ZANAFLEX) 2 MG capsule    Sig: Take 1 capsule (2 mg total) by mouth 3 (three) times daily as needed for muscle spasms.    Dispense:  45 capsule    Refill:  0  . meloxicam (MOBIC) 7.5 MG tablet    Sig: Take 1 tablet (7.5 mg total) by mouth daily.    Dispense:  30 tablet    Refill:  0  . traMADol (ULTRAM) 50 MG tablet    Sig: Take 1 tablet (50 mg total) by mouth every 8 (eight) hours as needed.    Dispense:  20 tablet    Refill:  0    No Follow-up on file.   Javares Kaufhold Elayne Guerin, M.D. [facility] [address] [place], [place]  [address] [phone] phone [phone] fax

## 2015-12-01 ENCOUNTER — Telehealth: Payer: Self-pay

## 2015-12-01 DIAGNOSIS — I83891 Varicose veins of right lower extremities with other complications: Secondary | ICD-10-CM | POA: Insufficient documentation

## 2015-12-01 NOTE — Telephone Encounter (Signed)
OV notes are not finished for referral to vein clinic. OV was 10/30

## 2015-12-02 NOTE — Telephone Encounter (Signed)
Completed.

## 2015-12-23 ENCOUNTER — Other Ambulatory Visit: Payer: Self-pay | Admitting: *Deleted

## 2015-12-23 DIAGNOSIS — I83811 Varicose veins of right lower extremities with pain: Secondary | ICD-10-CM

## 2016-01-19 ENCOUNTER — Encounter: Payer: Self-pay | Admitting: Vascular Surgery

## 2016-01-22 ENCOUNTER — Encounter: Payer: Medicare Other | Admitting: Vascular Surgery

## 2016-01-22 ENCOUNTER — Encounter (HOSPITAL_COMMUNITY): Payer: Medicare Other

## 2016-03-05 ENCOUNTER — Encounter: Payer: Self-pay | Admitting: Vascular Surgery

## 2016-03-11 ENCOUNTER — Encounter (HOSPITAL_COMMUNITY): Payer: Medicare Other

## 2016-03-11 ENCOUNTER — Encounter: Payer: Medicare Other | Admitting: Vascular Surgery

## 2016-04-19 ENCOUNTER — Encounter: Payer: Self-pay | Admitting: Vascular Surgery

## 2016-04-29 ENCOUNTER — Ambulatory Visit (HOSPITAL_COMMUNITY)
Admission: RE | Admit: 2016-04-29 | Discharge: 2016-04-29 | Disposition: A | Discharge status: Home / Self Care | Payer: Medicare Other | Source: Ambulatory Visit | Attending: Vascular Surgery | Admitting: Vascular Surgery

## 2016-04-29 ENCOUNTER — Ambulatory Visit (INDEPENDENT_AMBULATORY_CARE_PROVIDER_SITE_OTHER): Payer: Medicare Other | Admitting: Vascular Surgery

## 2016-04-29 ENCOUNTER — Encounter: Payer: Self-pay | Admitting: Vascular Surgery

## 2016-04-29 VITALS — BP 141/91 | HR 61 | Temp 97.1°F | Resp 16 | Ht 61.0 in | Wt 171.0 lb

## 2016-04-29 DIAGNOSIS — I83811 Varicose veins of right lower extremities with pain: Secondary | ICD-10-CM | POA: Diagnosis not present

## 2016-04-29 NOTE — Progress Notes (Signed)
Referring Physician: Reginia Forts  Patient name: [name] MRN: [id number] DOB: [birth year] Sex: female  REASON FOR CONSULT: Varicose veins with pain right leg  HPI: [name] is a 66 y.o. female with a several year history of right leg swelling dating back to 22. The swelling has slowly gotten worse over the last several years. She also has some pain and aching and fullness in the leg especially if she is on her feet all day long. She also has some burning and skin sensitivity over clusters of certain varicosities in her right leg. They sent her right leg has been swollen since 1970 after having her first child. She denies any prior history of DVT. She has had no prior leg operations. She did try some compression stockings in the past but was not satisfied with the result. She stated that her leg remains swollen. She has never had any problems with the left leg. Other medical problems include hyperlipidemia and hypertension both of which are currently stable.  Past Medical History:  Diagnosis Date  . Allergy    Zyrtec PRN  . Anemia   . Cataract   . Colon polyp 03/11/2007  . Diverticulosis    by colonoscopy 2009  . Fibromyalgia    diagnosed by Dr. Teressa Lower. s/p rheumatology consultation  . GERD (gastroesophageal reflux disease)   . Hyperlipidemia   . Hypertension   . IBS (irritable bowel syndrome)    diarrhea, constipation alternating; s/p GI consult.  . Internal hemorrhoid    colonoscopy in 2009   Past Surgical History:  Procedure Laterality Date  . CESAREAN SECTION    . COLONOSCOPY    . MYOMECTOMY    . TUBAL LIGATION    . UPPER GASTROINTESTINAL ENDOSCOPY      Family History  Problem Relation Age of Onset  . Hypertension Mother   . Stroke Mother     multiple CVAs/TIAs  . Heart disease Mother 28    CHF  . Cancer Father     prostate cancer  . Hypertension Sister   . Hyperlipidemia Sister   . Glaucoma Sister   . Hypertension Brother   .  Prostatitis Brother   . Hypertension Maternal Grandmother   . Hypertension Maternal Grandfather   . Drug abuse Paternal Grandmother   . Hypertension Sister   . Mental illness Sister   . Glaucoma Sister   . Esophageal cancer Neg Hx   . Rectal cancer Neg Hx   . Stomach cancer Neg Hx   . Colon cancer Cousin   . Pancreatic cancer Cousin     SOCIAL HISTORY: Social History   Social History  . Marital status: Single    Spouse name: N/A  . Number of children: N/A  . Years of education: N/A   Occupational History  . Not on file.   Social History Main Topics  . Smoking status: Former Smoker    Packs/day: 2.00    Years: 27.00    Quit date: 10/25/1994  . Smokeless tobacco: Never Used  . Alcohol use No  . Drug use: No  . Sexual activity: Yes   Other Topics Concern  . Not on file   Social History Narrative   Marital status: married x 1969; happily married; no abuse     Children: 3 children; 1 grandchildren.      Lives: with husband, son      Employment:  Homemaker      Tobacco: quit smoking in 1997; smoked  x 30 years.      Alcohol: none      Drugs: none      Exercise: none in 2017      Seatbelt: 100%     Guns:  None     ADLs: independent with ADLs; no assistant device      Advance Directives: none; desires FULL CODE; no prolonged measures.    Allergies  Allergen Reactions  . Aspirin     My stomach burns and a "little bit of feeling like hard to breath".  . Pollen Extract     Skin itching, runny eyes, sinus pressure  . Sulfa Antibiotics     itching    Current Outpatient Prescriptions  Medication Sig Dispense Refill  . meloxicam (MOBIC) 7.5 MG tablet Take 1 tablet (7.5 mg total) by mouth daily. 30 tablet 0  . losartan (COZAAR) 50 MG tablet take 1 tablet by mouth once daily (Patient not taking: Reported on 04/29/2016) 90 tablet 0   No current facility-administered medications for this visit.     ROS:   General:  No weight loss, Fever, chills  HEENT: No recent  headaches, no nasal bleeding, no visual changes, no sore throat  Neurologic: No dizziness, blackouts, seizures. No recent symptoms of stroke or mini- stroke. No recent episodes of slurred speech, or temporary blindness.  Cardiac: No recent episodes of chest pain/pressure, no shortness of breath at rest.  No shortness of breath with exertion.  Denies history of atrial fibrillation or irregular heartbeat  Vascular: No history of rest pain in feet.  No history of claudication.  No history of non-healing ulcer, No history of DVT   Pulmonary: No home oxygen, no productive cough, no hemoptysis,  No asthma or wheezing  Musculoskeletal:  [ ]  Arthritis, [ ]  Low back pain,  [ ]  Joint pain  Hematologic:No history of hypercoagulable state.  No history of easy bleeding.  No history of anemia  Gastrointestinal: No hematochezia or melena,  No gastroesophageal reflux, no trouble swallowing  Urinary: [ ]  chronic Kidney disease, [ ]  on HD - [ ]  MWF or [ ]  TTHS, [ ]  Burning with urination, [ ]  Frequent urination, [ ]  Difficulty urinating;   Skin: No rashes  Psychological: No history of anxiety,  No history of depression   Physical Examination  Vitals:   04/29/16 1404  BP: (!) 141/91  Pulse: 61  Resp: 16  Temp: 97.1 F (36.2 C)  TempSrc: Oral  SpO2: 99%  Weight: 171 lb (77.6 kg)  Height: 5\' 1"  (1.549 m)    Body mass index is 32.31 kg/m.  General:  Alert and oriented, no acute distress HEENT: Normal Neck: No bruit or JVD Pulmonary: Clear to auscultation bilaterally Cardiac: Regular Rate and Rhythm without murmur Abdomen: Soft, non-tender, non-distended, no mass Skin: No rash, cluster of varicosities 4-6 mm in diameter right posterior calf encompassing the surface of about 7 cm. 3-4 mm diameter varicosities covering most of the right dorsal foot. The varicosities around the right posterior calf are tender to palpation. Extremity Pulses:  2+ radial, brachial, femoral, 2+ left absent right  dorsalis pedis, absent posterior tibial pulses bilaterally Musculoskeletal: Right leg is more swollen than the left. The right leg diffusely is about 40% larger than the left from the foot all the way to the thigh Neurologic: Upper and lower extremity motor 5/5 and symmetric  DATA:  Patient is a venous reflux exam today. This showed reflux in the right common femoral vein as well  as the right saphenofemoral junction and right greater saphenous vein. The right greater saphenous vein was about 5-6 mm in diameter throughout most of its course.  ASSESSMENT:  Patient with symptomatic varicose veins right lower extremity with evidence of superficial venous reflux. The patient also has a chronically swollen right leg that has been present for almost 40 years. The history is suggestive that maybe she has some right iliac vein compression from her previous childbirth. Had lengthy discussion with the patient today about possible intervention with laser ablation of her right greater saphenous vein or whether or not to consider a venogram to study her right iliac venous system to see if she has some outlet obstruction of her right iliac vein. However, I told her that this is not a life-threatening problem and was more of a nuisance issues in general. She states that she would like about whether or not she wishes intervention for the iliac vein or for her greater saphenous vein and call us in the future if she wishes to pursue this. Otherwise she will continue wearing compression stockings. I gave her a brochure today for ordering 20-30 mm knee-high or thigh high compression stockings to improve her symptoms.   PLAN:  The patient will call us if she wishes to have an intervention for further investigation of her iliac vein which would involve venogram and most likely either of her iliac venous system after having a duplex of her vena cava and iliacs in the office. She can also call since she wishes an intervention with  laser ablation of her right greater saphenous vein.  Ruta Hinds, MD [facility] Office: [phone] Pager: [phone]

## 2016-06-25 ENCOUNTER — Other Ambulatory Visit: Payer: Self-pay | Admitting: Family Medicine

## 2016-06-25 DIAGNOSIS — Z1231 Encounter for screening mammogram for malignant neoplasm of breast: Secondary | ICD-10-CM

## 2016-06-30 ENCOUNTER — Telehealth: Payer: Self-pay | Admitting: Vascular Surgery

## 2016-06-30 NOTE — Telephone Encounter (Signed)
-----   Message from Rica Records, RN sent at 06/30/2016  3:52 PM EDT ----- Regarding: scheduling Contact: [phone] Please schedule Levy Sjogren for a 3 month VV FU with [facility] (no lab needed) anytime after 07-29-2016.  She saw Dr. Oneida Alar for new VV evaluation on 04-29-2016.  Thanks!

## 2016-06-30 NOTE — Telephone Encounter (Signed)
I spoke with the patient and scheduled her appointment for Monday 08/02/16 at 1:30pm with Dr.Lawson. awt

## 2016-07-06 ENCOUNTER — Ambulatory Visit
Admission: RE | Admit: 2016-07-06 | Discharge: 2016-07-06 | Disposition: A | Discharge status: Home / Self Care | Payer: Medicare Other | Source: Ambulatory Visit | Attending: Family Medicine | Admitting: Family Medicine

## 2016-07-06 DIAGNOSIS — Z1231 Encounter for screening mammogram for malignant neoplasm of breast: Secondary | ICD-10-CM | POA: Diagnosis not present

## 2016-07-12 ENCOUNTER — Ambulatory Visit (INDEPENDENT_AMBULATORY_CARE_PROVIDER_SITE_OTHER): Payer: Medicare Other | Admitting: Family Medicine

## 2016-07-12 ENCOUNTER — Encounter: Payer: Self-pay | Admitting: Family Medicine

## 2016-07-12 VITALS — BP 140/82 | HR 68 | Temp 98.0°F | Resp 18 | Ht 62.6 in | Wt 169.0 lb

## 2016-07-12 DIAGNOSIS — I10 Essential (primary) hypertension: Secondary | ICD-10-CM

## 2016-07-12 DIAGNOSIS — R1032 Left lower quadrant pain: Secondary | ICD-10-CM | POA: Diagnosis not present

## 2016-07-12 DIAGNOSIS — K644 Residual hemorrhoidal skin tags: Secondary | ICD-10-CM

## 2016-07-12 DIAGNOSIS — I83891 Varicose veins of right lower extremities with other complications: Secondary | ICD-10-CM | POA: Diagnosis not present

## 2016-07-12 DIAGNOSIS — Z0000 Encounter for general adult medical examination without abnormal findings: Secondary | ICD-10-CM

## 2016-07-12 DIAGNOSIS — E7800 Pure hypercholesterolemia, unspecified: Secondary | ICD-10-CM | POA: Diagnosis not present

## 2016-07-12 DIAGNOSIS — Z23 Encounter for immunization: Secondary | ICD-10-CM

## 2016-07-12 DIAGNOSIS — N3941 Urge incontinence: Secondary | ICD-10-CM | POA: Diagnosis not present

## 2016-07-12 DIAGNOSIS — E2839 Other primary ovarian failure: Secondary | ICD-10-CM | POA: Diagnosis not present

## 2016-07-12 DIAGNOSIS — R7302 Impaired glucose tolerance (oral): Secondary | ICD-10-CM | POA: Diagnosis not present

## 2016-07-12 LAB — POCT URINALYSIS DIP (MANUAL ENTRY)
BILIRUBIN UA: NEGATIVE
BILIRUBIN UA: NEGATIVE mg/dL
GLUCOSE UA: NEGATIVE mg/dL
LEUKOCYTES UA: NEGATIVE
NITRITE UA: NEGATIVE
Spec Grav, UA: 1.02 (ref 1.010–1.025)
Urobilinogen, UA: 0.2 E.U./dL
pH, UA: 5 (ref 5.0–8.0)

## 2016-07-12 MED ORDER — AMLODIPINE BESYLATE 2.5 MG PO TABS: 2.5000 mg | ORAL_TABLET | Freq: Every day | ORAL | 1 refills | Status: DC

## 2016-07-12 MED ORDER — HYOSCYAMINE SULFATE ER 0.375 MG PO TB12: 0.3750 mg | ORAL_TABLET | Freq: Two times a day (BID) | ORAL | 0 refills | Status: DC | PRN

## 2016-07-12 MED ORDER — ZOSTER VAC RECOMB ADJUVANTED 50 MCG/0.5ML IM SUSR: 0.5000 mL | Freq: Once | INTRAMUSCULAR | 1 refills | Status: AC

## 2016-07-12 MED ORDER — HYDROCORTISONE ACETATE 25 MG RE SUPP: 25.0000 mg | Freq: Two times a day (BID) | RECTAL | 3 refills | Status: DC

## 2016-07-12 NOTE — Progress Notes (Signed)
Subjective:    Patient ID: [name], female    DOB: 06-03-1950, 66 y.o.   MRN: [id number]  07/12/2016  Annual Exam   HPI This 66 y.o. female presents for Annual Wellness Examination, Complete Physical Examination, and chronic medical conditions.  Last physical:  03/18/2015, 12/04/13 Pap smear:  12/04/2013  WNL HPV negative. Mammogram:  07/06/2016 Colonoscopy:  05/15/2015 Bone density:  n/d Eye exam:  Glasses; no glaucoma or cataracts. Dental exam:  neds to schedule.   Visual Acuity Screening   Right eye Left eye Both eyes  Without correction:     With correction: 20/25 20/25 20/20   Hearing Screening Comments: Right Ear-6 ft Good Left Ear-6  ft Good    BP Readings from Last 3 Encounters:  07/12/16 140/82  04/29/16 (!) 141/91  11/10/15 (!) 142/90   Wt Readings from Last 3 Encounters:  07/12/16 169 lb (76.7 kg)  04/29/16 171 lb (77.6 kg)  11/10/15 168 lb (76.2 kg)   Immunization History  Administered Date(s) Administered  . Influenza,inj,Quad PF,36+ Mos 11/10/2015  . Influenza-Unspecified 03/14/2015  . Pneumococcal Conjugate-13 03/14/2015  . Pneumococcal Polysaccharide-23 07/12/2016  . Tdap 12/04/2013   HTN: stopped Losartan.  When takes it after one week, causes joint pain and myalgias; pain is intensified. Has not taken Losartan in one year.    Abdominal pain: IBS problems are intensifying; has narrowing due to diverticulosis.  Cramping pain; will vomiting.   Trying to stop sodas.  Urinary leakage: nocturia x 3-4; urge incontinence; stress and urge.  Duration for two years.  Kiegels are not helpful.  No urology consultation.    Hemorrhoid leakage:  Difficult to keep clean; always wiping excessively.   Review of Systems  Constitutional: Negative for activity change, appetite change, chills, diaphoresis, fatigue, fever and unexpected weight change.  HENT: Negative for congestion, dental problem, drooling, ear discharge, ear pain, facial swelling, hearing loss,  mouth sores, nosebleeds, postnasal drip, rhinorrhea, sinus pressure, sneezing, sore throat, tinnitus, trouble swallowing and voice change.   Eyes: Negative for photophobia, pain, discharge, redness, itching and visual disturbance.  Respiratory: Negative for apnea, cough, choking, chest tightness, shortness of breath, wheezing and stridor.   Cardiovascular: Negative for chest pain, palpitations and leg swelling.  Gastrointestinal: Positive for abdominal pain. Negative for abdominal distention, anal bleeding, blood in stool, constipation, diarrhea, nausea, rectal pain and vomiting.  Endocrine: Negative for cold intolerance, heat intolerance, polydipsia, polyphagia and polyuria.  Genitourinary: Negative for decreased urine volume, difficulty urinating, dyspareunia, dysuria, enuresis, flank pain, frequency, genital sores, hematuria, menstrual problem, pelvic pain, urgency, vaginal bleeding, vaginal discharge and vaginal pain.  Musculoskeletal: Negative for arthralgias, back pain, gait problem, joint swelling, myalgias, neck pain and neck stiffness.  Skin: Negative for color change, pallor, rash and wound.  Allergic/Immunologic: Negative for environmental allergies, food allergies and immunocompromised state.  Neurological: Negative for dizziness, tremors, seizures, syncope, facial asymmetry, speech difficulty, weakness, light-headedness, numbness and headaches.  Hematological: Negative for adenopathy. Does not bruise/bleed easily.  Psychiatric/Behavioral: Negative for agitation, behavioral problems, confusion, decreased concentration, dysphoric mood, hallucinations, self-injury, sleep disturbance and suicidal ideas. The patient is not nervous/anxious and is not hyperactive.     Past Medical History:  Diagnosis Date  . Allergy    Zyrtec PRN  . Anemia   . Cataract   . Colon polyp 03/11/2007  . Diverticulosis    by colonoscopy 2009  . Fibromyalgia    diagnosed by Dr. Teressa Lower. s/p rheumatology  consultation  . GERD (gastroesophageal reflux  disease)   . Hyperlipidemia   . Hypertension   . IBS (irritable bowel syndrome)    diarrhea, constipation alternating; s/p GI consult.  . Internal hemorrhoid    colonoscopy in 2009   Past Surgical History:  Procedure Laterality Date  . CESAREAN SECTION    . COLONOSCOPY    . MYOMECTOMY    . TUBAL LIGATION    . UPPER GASTROINTESTINAL ENDOSCOPY     Allergies  Allergen Reactions  . Aspirin     My stomach burns and a "little bit of feeling like hard to breath".  . Pollen Extract     Skin itching, runny eyes, sinus pressure  . Sulfa Antibiotics     itching    Social History   Social History  . Marital status: Single    Spouse name: N/A  . Number of children: N/A  . Years of education: N/A   Occupational History  . Not on file.   Social History Main Topics  . Smoking status: Former Smoker    Packs/day: 2.00    Years: 27.00    Quit date: 10/25/1994  . Smokeless tobacco: Never Used  . Alcohol use No  . Drug use: No  . Sexual activity: Yes   Other Topics Concern  . Not on file   Social History Narrative   Marital status: married x 1969; happily married; no abuse     Children: 3 children; 1 grandchildren (26yo).      Lives: with husband, son, daughter      Employment:  Homemaker      Tobacco: quit smoking in 1997; smoked x 30 years.      Alcohol: none      Drugs: none      Exercise: walking three days per week 2018      Seatbelt: 100%     Guns:  None     ADLs: independent with ADLs; no assistant device      Advance Directives: none; desires FULL CODE; no prolonged measures.   Family History  Problem Relation Age of Onset  . Hypertension Mother   . Stroke Mother        multiple CVAs/TIAs  . Heart disease Mother 65       CHF  . Cancer Father        prostate cancer  . Hypertension Sister   . Hyperlipidemia Sister   . Glaucoma Sister   . Hypertension Brother   . Prostatitis Brother   . Hypertension Maternal  Grandmother   . Hypertension Maternal Grandfather   . Drug abuse Paternal Grandmother   . Hypertension Sister   . Mental illness Sister   . Glaucoma Sister   . Colon cancer Cousin   . Pancreatic cancer Cousin   . Esophageal cancer Neg Hx   . Rectal cancer Neg Hx   . Stomach cancer Neg Hx        Objective:    BP 140/82   Pulse 68   Temp 98 F (36.7 C) (Oral)   Resp 18   Ht 5' 2.6" (1.59 m)   Wt 169 lb (76.7 kg)   SpO2 98%   BMI 30.32 kg/m  Physical Exam  Constitutional: She is oriented to person, place, and time. She appears well-developed and well-nourished. No distress.  HENT:  Head: Normocephalic and atraumatic.  Right Ear: External ear normal.  Left Ear: External ear normal.  Nose: Nose normal.  Mouth/Throat: Oropharynx is clear and moist.  Eyes: Conjunctivae and EOM are  normal. Pupils are equal, round, and reactive to light.  Neck: Normal range of motion and full passive range of motion without pain. Neck supple. No JVD present. Carotid bruit is not present. No thyromegaly present.  Cardiovascular: Normal rate, regular rhythm and normal heart sounds.  Exam reveals no gallop and no friction rub.   No murmur heard. Varicosities B lower extremities.  Pulmonary/Chest: Effort normal and breath sounds normal. She has no wheezes. She has no rales. Right breast exhibits no inverted nipple, no mass, no nipple discharge, no skin change and no tenderness. Left breast exhibits no inverted nipple, no mass, no nipple discharge, no skin change and no tenderness. Breasts are symmetrical.  Abdominal: Soft. Bowel sounds are normal. She exhibits no distension and no mass. There is tenderness in the left lower quadrant. There is no rebound and no guarding.  Musculoskeletal:       Right shoulder: Normal.       Left shoulder: Normal.       Cervical back: Normal.  Lymphadenopathy:    She has no cervical adenopathy.  Neurological: She is alert and oriented to person, place, and time. She  has normal reflexes. No cranial nerve deficit. She exhibits normal muscle tone. Coordination normal.  Skin: Skin is warm and dry. No rash noted. She is not diaphoretic. No erythema. No pallor.  Psychiatric: She has a normal mood and affect. Her behavior is normal. Judgment and thought content normal.  Nursing note and vitals reviewed.  Depression screen [facility] 2/9 07/12/2016 11/10/2015 07/22/2015 03/18/2015  Decreased Interest 0 0 1 0  Down, Depressed, Hopeless 0 0 1 0  PHQ - 2 Score 0 0 2 0  Altered sleeping - - 3 -  Tired, decreased energy - - 1 -  Change in appetite - - 0 -  Feeling bad or failure about yourself  - - 0 -  Trouble concentrating - - 3 -  Moving slowly or fidgety/restless - - 0 -  Suicidal thoughts - - 0 -  PHQ-9 Score - - 9 -   Fall Risk  07/12/2016 11/10/2015 07/22/2015 03/18/2015  Falls in the past year? No No No No   Functional Status Survey: Is the patient deaf or have difficulty hearing?: No Does the patient have difficulty seeing, even when wearing glasses/contacts?: No Does the patient have difficulty concentrating, remembering, or making decisions?: No Does the patient have difficulty walking or climbing stairs?: No Does the patient have difficulty dressing or bathing?: No Does the patient have difficulty doing errands alone such as visiting a doctor's office or shopping?: No      Assessment & Plan:   1. Encounter for Medicare annual wellness exam   2. Routine physical examination   3. Estrogen deficiency   4. Essential hypertension   5. Glucose intolerance (impaired glucose tolerance)   6. Pure hypercholesterolemia   7. Symptomatic varicose veins, right   8. Need for prophylactic vaccination against Streptococcus pneumoniae (pneumococcus)   9. Urge incontinence   10. Left lower quadrant pain   11. Inflamed external hemorrhoid    -anticipatory guidance provided --- exercise, weight loss, safe driving practices, aspirin 81mg  daily. -obtain age appropriate  screening labs and labs for chronic disease management. -moderate fall risk; no evidence of depression; no evidence of hearing loss.  Discussed advanced directives and living will; also discussed end of life issues including code status.  -refer to urology for urinary incontinence. -has follow-up with vascular surgery for symptomatic varicose veins. -refer to  gastroenterology due to abdominal pain and evaluation for IBS type symptoms.    Orders Placed This Encounter  Procedures  . Urine Culture  . DG Bone Density    Standing Status:   Future    Standing Expiration Date:   09/12/2017    Order Specific Question:   Reason for Exam (SYMPTOM  OR DIAGNOSIS REQUIRED)    Answer:   estrogen deficiency    Order Specific Question:   Preferred imaging location?    Answer:   [facility]  . Pneumococcal polysaccharide vaccine 23-valent greater than or equal to 2yo subcutaneous/IM  . CBC with Differential/Platelet  . Comprehensive metabolic panel    Order Specific Question:   Has the patient fasted?    Answer:   Yes  . Hemoglobin A1c  . Lipid panel    Order Specific Question:   Has the patient fasted?    Answer:   Yes  . TSH  . Ambulatory referral to Urology    Referral Priority:   Routine    Referral Type:   Consultation    Referral Reason:   Specialty Services Required    Requested Specialty:   Urology    Number of Visits Requested:   1  . Ambulatory referral to Gastroenterology    Referral Priority:   Routine    Referral Type:   Consultation    Referral Reason:   Specialty Services Required    Number of Visits Requested:   1  . POCT urinalysis dipstick  . EKG 12-Lead   Meds ordered this encounter  Medications  . Zoster Vac Recomb Adjuvanted [facility] Novamed) injection    Sig: Inject 0.5 mLs into the muscle once.    Dispense:  0.5 mL    Refill:  1  . hydrocortisone (ANUSOL-HC) 25 MG suppository    Sig: Place 1 suppository (25 mg total) rectally 2 (two) times daily.    Dispense:  12  suppository    Refill:  3  . hyoscyamine (LEVBID) 0.375 MG 12 hr tablet    Sig: Take 1 tablet (0.375 mg total) by mouth every 12 (twelve) hours as needed.    Dispense:  60 tablet    Refill:  0  . amLODipine (NORVASC) 2.5 MG tablet    Sig: Take 1 tablet (2.5 mg total) by mouth daily.    Dispense:  90 tablet    Refill:  1    Return in about 2 months (around 09/12/2016) for recheck hypertension, urge incontinence, hemorrhoids.   Camera Krienke Elayne Guerin, M.D. Primary Care at [facility] [address] [place], [place]  [address] [phone] phone [phone] fax

## 2016-07-12 NOTE — Progress Notes (Signed)
   Subjective:    Patient ID: [name], female    DOB: [birth year], 66 y.o.   MRN: [id number]  HPI    Review of Systems     Objective:   Physical Exam        Assessment & Plan:

## 2016-07-12 NOTE — Patient Instructions (Addendum)
   IF you received an x-ray today, you will receive an invoice from [facility]. Please contact [facility] at [phone] with questions or concerns regarding your invoice.   IF you received labwork today, you will receive an invoice from [facility]. Please contact [facility] at [phone] with questions or concerns regarding your invoice.   Our billing staff will not be able to assist you with questions regarding bills from these companies.  You will be contacted with the lab results as soon as they are available. The fastest way to get your results is to activate your My Chart account. Instructions are located on the last page of this paperwork. If you have not heard from us regarding the results in 2 weeks, please contact this office.      Preventive Care 65 Years and Older, Female Preventive care refers to lifestyle choices and visits with your health care provider that can promote health and wellness. What does preventive care include?  A yearly physical exam. This is also called an annual well check.  Dental exams once or twice a year.  Routine eye exams. Ask your health care provider how often you should have your eyes checked.  Personal lifestyle choices, including: ? Daily care of your teeth and gums. ? Regular physical activity. ? Eating a healthy diet. ? Avoiding tobacco and drug use. ? Limiting alcohol use. ? Practicing safe sex. ? Taking low-dose aspirin every day. ? Taking vitamin and mineral supplements as recommended by your health care provider. What happens during an annual well check? The services and screenings done by your health care provider during your annual well check will depend on your age, overall health, lifestyle risk factors, and family history of disease. Counseling Your health care provider may ask you questions about your:  Alcohol use.  Tobacco use.  Drug use.  Emotional well-being.  Home and relationship  well-being.  Sexual activity.  Eating habits.  History of falls.  Memory and ability to understand (cognition).  Work and work environment.  Reproductive health.  Screening You may have the following tests or measurements:  Height, weight, and BMI.  Blood pressure.  Lipid and cholesterol levels. These may be checked every 5 years, or more frequently if you are over 50 years old.  Skin check.  Lung cancer screening. You may have this screening every year starting at age 55 if you have a 30-pack-year history of smoking and currently smoke or have quit within the past 15 years.  Fecal occult blood test (FOBT) of the stool. You may have this test every year starting at age 50.  Flexible sigmoidoscopy or colonoscopy. You may have a sigmoidoscopy every 5 years or a colonoscopy every 10 years starting at age 50.  Hepatitis C blood test.  Hepatitis B blood test.  Sexually transmitted disease (STD) testing.  Diabetes screening. This is done by checking your blood sugar (glucose) after you have not eaten for a while (fasting). You may have this done every 1-3 years.  Bone density scan. This is done to screen for osteoporosis. You may have this done starting at age 65.  Mammogram. This may be done every 1-2 years. Talk to your health care provider about how often you should have regular mammograms.  Talk with your health care provider about your test results, treatment options, and if necessary, the need for more tests. Vaccines Your health care provider may recommend certain vaccines, such as:  Influenza vaccine. This is recommended every year.    Tetanus, diphtheria, and acellular pertussis (Tdap, Td) vaccine. You may need a Td booster every 10 years.  Varicella vaccine. You may need this if you have not been vaccinated.  Zoster vaccine. You may need this after age 60.  Measles, mumps, and rubella (MMR) vaccine. You may need at least one dose of MMR if you were born in  1957 or later. You may also need a second dose.  Pneumococcal 13-valent conjugate (PCV13) vaccine. One dose is recommended after age 65.  Pneumococcal polysaccharide (PPSV23) vaccine. One dose is recommended after age 65.  Meningococcal vaccine. You may need this if you have certain conditions.  Hepatitis A vaccine. You may need this if you have certain conditions or if you travel or work in places where you may be exposed to hepatitis A.  Hepatitis B vaccine. You may need this if you have certain conditions or if you travel or work in places where you may be exposed to hepatitis B.  Haemophilus influenzae type b (Hib) vaccine. You may need this if you have certain conditions.  Talk to your health care provider about which screenings and vaccines you need and how often you need them. This information is not intended to replace advice given to you by your health care provider. Make sure you discuss any questions you have with your health care provider. Document Released: 01/24/2015 Document Revised: 09/17/2015 Document Reviewed: 10/29/2014 Elsevier Interactive Patient Education  2017 Elsevier Inc.  

## 2016-07-12 NOTE — Progress Notes (Signed)
   Subjective:    Patient ID: [name], female    DOB: 17-Jul-1950, 66 y.o.   MRN: [id number]  HPI    Review of Systems  HENT: Positive for dental problem, hearing loss, postnasal drip and trouble swallowing.   Eyes: Positive for itching and visual disturbance.  Respiratory: Positive for cough and wheezing.   Cardiovascular: Positive for palpitations and leg swelling.  Gastrointestinal: Positive for abdominal distention, constipation, diarrhea, nausea, rectal pain and vomiting.  Genitourinary: Positive for flank pain.  Musculoskeletal: Positive for arthralgias, back pain, gait problem, joint swelling, myalgias and neck stiffness.  Skin: Negative.   Allergic/Immunologic: Positive for environmental allergies and food allergies.  Neurological: Positive for weakness and light-headedness.  Hematological: Negative.   Psychiatric/Behavioral: Negative.        Objective:   Physical Exam        Assessment & Plan:

## 2016-07-13 LAB — CBC WITH DIFFERENTIAL/PLATELET
BASOS ABS: 0 10*3/uL (ref 0.0–0.2)
Basos: 0 %
EOS (ABSOLUTE): 0.1 10*3/uL (ref 0.0–0.4)
Eos: 2 %
Hematocrit: 42.8 % (ref 34.0–46.6)
Hemoglobin: 14.1 g/dL (ref 11.1–15.9)
IMMATURE GRANS (ABS): 0 10*3/uL (ref 0.0–0.1)
Immature Granulocytes: 0 %
LYMPHS ABS: 2.2 10*3/uL (ref 0.7–3.1)
LYMPHS: 63 %
MCH: 30.9 pg (ref 26.6–33.0)
MCHC: 32.9 g/dL (ref 31.5–35.7)
MCV: 94 fL (ref 79–97)
Monocytes Absolute: 0.2 10*3/uL (ref 0.1–0.9)
Monocytes: 7 %
NEUTROS ABS: 1 10*3/uL — AB (ref 1.4–7.0)
Neutrophils: 28 %
PLATELETS: 176 10*3/uL (ref 150–379)
RBC: 4.57 x10E6/uL (ref 3.77–5.28)
RDW: 14.4 % (ref 12.3–15.4)
WBC: 3.5 10*3/uL (ref 3.4–10.8)

## 2016-07-13 LAB — COMPREHENSIVE METABOLIC PANEL
A/G RATIO: 1.3 (ref 1.2–2.2)
ALT: 9 IU/L (ref 0–32)
AST: 15 IU/L (ref 0–40)
Albumin: 3.9 g/dL (ref 3.6–4.8)
Alkaline Phosphatase: 72 IU/L (ref 39–117)
BUN/Creatinine Ratio: 9 — ABNORMAL LOW (ref 12–28)
BUN: 9 mg/dL (ref 8–27)
Bilirubin Total: <0.2 mg/dL (ref 0.0–1.2)
CO2: 22 mmol/L (ref 20–29)
Calcium: 8.8 mg/dL (ref 8.7–10.3)
Chloride: 104 mmol/L (ref 96–106)
Creatinine, Ser: 0.95 mg/dL (ref 0.57–1.00)
GFR calc Af Amer: 72 mL/min/{1.73_m2} (ref >59)
GFR calc non Af Amer: 63 mL/min/{1.73_m2} (ref >59)
Globulin, Total: 2.9 g/dL (ref 1.5–4.5)
Glucose: 97 mg/dL (ref 65–99)
POTASSIUM: 4.2 mmol/L (ref 3.5–5.2)
Sodium: 144 mmol/L (ref 134–144)
Total Protein: 6.8 g/dL (ref 6.0–8.5)

## 2016-07-13 LAB — HEMOGLOBIN A1C
Est. average glucose Bld gHb Est-mCnc: 111 mg/dL
HEMOGLOBIN A1C: 5.5 % (ref 4.8–5.6)

## 2016-07-13 LAB — URINE CULTURE

## 2016-07-13 LAB — LIPID PANEL
CHOLESTEROL TOTAL: 242 mg/dL — AB (ref 100–199)
Chol/HDL Ratio: 5.8 ratio — ABNORMAL HIGH (ref 0.0–4.4)
HDL: 42 mg/dL (ref >39)
LDL CALC: 160 mg/dL — AB (ref 0–99)
Triglycerides: 199 mg/dL — ABNORMAL HIGH (ref 0–149)
VLDL Cholesterol Cal: 40 mg/dL (ref 5–40)

## 2016-07-13 LAB — TSH: TSH: 2.92 u[IU]/mL (ref 0.450–4.500)

## 2016-07-15 ENCOUNTER — Telehealth: Payer: Self-pay | Admitting: Family Medicine

## 2016-07-15 NOTE — Telephone Encounter (Signed)
Dr. Tamala Julian, can you see this message, please. Thanks

## 2016-07-15 NOTE — Telephone Encounter (Signed)
PATIENT HAD HER ANNUAL PHYSICAL DONE WITH DR. Tamala Julian ON Monday 07/12/16. DR. Tamala Julian PRESCRIBED HER TO HAVE HYDROCORTISONE 25 MG SUPPOSITORY. HER [employer] MEDICARE WILL ONLY COVER THE CREAM. THEY ALSO WILL NOT COVER THE HYOSCYAMINE 0.375 MG. WHAT CAN THEY SUBSTITUTE WITH? BEST PHONE [phone] (CELL) PHARMACY CHOICE IS [facility] ON [address]. [place]

## 2016-07-19 MED ORDER — HYDROCORTISONE 1 % RE CREA: 1.0000 "application " | TOPICAL_CREAM | Freq: Three times a day (TID) | RECTAL | 2 refills | Status: DC | PRN

## 2016-07-19 NOTE — Telephone Encounter (Signed)
Done

## 2016-07-19 NOTE — Telephone Encounter (Signed)
I sent in hydrocortisone rectal cream.  I do not have a substitute for hyoscyamine.   Please advise patient.

## 2016-07-21 ENCOUNTER — Encounter: Payer: Self-pay | Admitting: Vascular Surgery

## 2016-07-24 DIAGNOSIS — K644 Residual hemorrhoidal skin tags: Secondary | ICD-10-CM | POA: Insufficient documentation

## 2016-08-02 ENCOUNTER — Encounter: Payer: Self-pay | Admitting: Vascular Surgery

## 2016-08-02 ENCOUNTER — Ambulatory Visit (INDEPENDENT_AMBULATORY_CARE_PROVIDER_SITE_OTHER): Payer: Medicare Other | Admitting: Vascular Surgery

## 2016-08-02 VITALS — BP 136/87 | HR 78 | Temp 98.3°F | Resp 16 | Ht 61.0 in | Wt 168.0 lb

## 2016-08-02 DIAGNOSIS — I83891 Varicose veins of right lower extremities with other complications: Secondary | ICD-10-CM | POA: Diagnosis not present

## 2016-08-02 NOTE — Progress Notes (Signed)
Vitals:   08/02/16 1343  BP: (!) 153/88  Pulse: 78  Resp: 16  Temp: 98.3 F (36.8 C)  SpO2: 98%  Weight: 168 lb (76.2 kg)  Height: 5\' 1"  (1.549 m)

## 2016-08-02 NOTE — Progress Notes (Signed)
Subjective:     Patient ID: [name], female   DOB: 01/21/1950, 66 y.o.   MRN: [id number]  HPI This 66 year old female returns for further follow-up regarding her pain and swelling in the right lower extremity. She was evaluated by Dr. fields 3 months ago and is tried long-leg elastic compression stockings 20-30 millimeter gradient as well as elevation and ibuprofen. She continues to have severe swelling which worsens as the day progresses and has painful varicosities in the medial thigh calf and foot which are affecting her daily living and resistant to these conservative measures. She has no history of DVT. The symptoms have been present and worsening over the past 30 years.  Past Medical History:  Diagnosis Date  . Allergy    Zyrtec PRN  . Anemia   . Cataract   . Colon polyp 03/11/2007  . Diverticulosis    by colonoscopy 2009  . Fibromyalgia    diagnosed by Dr. Teressa Lower. s/p rheumatology consultation  . GERD (gastroesophageal reflux disease)   . Hyperlipidemia   . Hypertension   . IBS (irritable bowel syndrome)    diarrhea, constipation alternating; s/p GI consult.  . Internal hemorrhoid    colonoscopy in 2009    Social History  Substance Use Topics  . Smoking status: Former Smoker    Packs/day: 2.00    Years: 27.00    Quit date: 10/25/1994  . Smokeless tobacco: Never Used  . Alcohol use No    Family History  Problem Relation Age of Onset  . Hypertension Mother   . Stroke Mother        multiple CVAs/TIAs  . Heart disease Mother 63       CHF  . Cancer Father        prostate cancer  . Hypertension Sister   . Hyperlipidemia Sister   . Glaucoma Sister   . Hypertension Brother   . Prostatitis Brother   . Hypertension Maternal Grandmother   . Hypertension Maternal Grandfather   . Drug abuse Paternal Grandmother   . Hypertension Sister   . Mental illness Sister   . Glaucoma Sister   . Colon cancer Cousin   . Pancreatic cancer Cousin   . Esophageal cancer  Neg Hx   . Rectal cancer Neg Hx   . Stomach cancer Neg Hx     Allergies  Allergen Reactions  . Aspirin     My stomach burns and a "little bit of feeling like hard to breath".  . Pollen Extract     Skin itching, runny eyes, sinus pressure  . Sulfa Antibiotics     itching     Current Outpatient Prescriptions:  .  amLODipine (NORVASC) 2.5 MG tablet, Take 1 tablet (2.5 mg total) by mouth daily., Disp: 90 tablet, Rfl: 1 .  hydrocortisone (ANUSOL-HC) 25 MG suppository, Place 1 suppository (25 mg total) rectally 2 (two) times daily., Disp: 12 suppository, Rfl: 3 .  hydrocortisone (PROCTOCORT) 1 % CREA, Apply 1 application topically 3 (three) times daily as needed., Disp: 28 g, Rfl: 2 .  hyoscyamine (LEVBID) 0.375 MG 12 hr tablet, Take 1 tablet (0.375 mg total) by mouth every 12 (twelve) hours as needed. (Patient not taking: Reported on 08/02/2016), Disp: 60 tablet, Rfl: 0 .  losartan (COZAAR) 50 MG tablet, take 1 tablet by mouth once daily (Patient not taking: Reported on 07/12/2016), Disp: 90 tablet, Rfl: 0 .  meloxicam (MOBIC) 7.5 MG tablet, Take 1 tablet (7.5 mg total) by mouth daily. (Patient  not taking: Reported on 07/12/2016), Disp: 30 tablet, Rfl: 0  Vitals:   08/02/16 1343 08/02/16 1347  BP: (!) 153/88 136/87  Pulse: 78 78  Resp: 16   Temp: 98.3 F (36.8 C)   SpO2: 98%   Weight: 168 lb (76.2 kg)   Height: 5\' 1"  (1.549 m)     Body mass index is 31.74 kg/m.         Review of Systems Denies chest pain, dyspnea on exertion, PND, orthopnea, hemoptysis, claudication    Objective:   Physical Exam BP 136/87 (BP Location: Left Arm, Patient Position: Sitting, Cuff Size: Normal)   Pulse 78   Temp 98.3 F (36.8 C)   Resp 16   Ht 5\' 1"  (1.549 m)   Wt 168 lb (76.2 kg)   SpO2 98%   BMI 31.74 kg/m   Gen. well-developed well-nourished female no apparent distress alert and oriented 3 Right leg with swelling from mid thigh to foot with bulging varicosities in the medial distal  thigh and medial calf and some extending onto the dorsum of right foot. No hyperpigmentation or ulceration noted. Left leg free of edema or varicosities.  Today I reviewed the previous reflux exam done in our office on 04/29/2016 and also performed a bedside SonoSite ultrasound exam which confirmed severe reflux in the right great saphenous vein which is enlarged throughout and supplying these painful varicosities with no DVT     Assessment:     Painful varicosities and swelling right leg worsened by gross reflux right great saphenous system-symptoms are resistant to conservative measures including long-leg elastic compression stockings 20-30 millimeter gradient, elevation, and ibuprofen are affecting patient's daily living    Plan:     Patient needs laser ablation right great saphenous vein followed by three-month waiting. Then be evaluated for stab phlebectomy of painful varicosities We will proceed with precertification to perform this in the near future and hopefully improve her edema and pain

## 2016-08-04 ENCOUNTER — Other Ambulatory Visit: Payer: Self-pay | Admitting: *Deleted

## 2016-08-04 DIAGNOSIS — I83891 Varicose veins of right lower extremities with other complications: Secondary | ICD-10-CM

## 2016-08-05 ENCOUNTER — Encounter: Payer: Self-pay | Admitting: Vascular Surgery

## 2016-08-16 ENCOUNTER — Encounter: Payer: Self-pay | Admitting: Vascular Surgery

## 2016-08-17 ENCOUNTER — Encounter: Payer: Self-pay | Admitting: Vascular Surgery

## 2016-08-17 ENCOUNTER — Ambulatory Visit (INDEPENDENT_AMBULATORY_CARE_PROVIDER_SITE_OTHER): Payer: Medicare Other | Admitting: Vascular Surgery

## 2016-08-17 VITALS — BP 149/79 | HR 81 | Temp 98.7°F | Resp 16 | Ht 61.0 in | Wt 168.0 lb

## 2016-08-17 DIAGNOSIS — I83891 Varicose veins of right lower extremities with other complications: Secondary | ICD-10-CM | POA: Diagnosis not present

## 2016-08-17 HISTORY — PX: ENDOVENOUS ABLATION SAPHENOUS VEIN W/ LASER: SUR449

## 2016-08-17 NOTE — Progress Notes (Signed)
Laser Ablation Procedure    Date: 08/17/2016   [name] DOB:1950/01/12  Consent signed: Yes    Surgeon:  Dr. Nelda Severe. Kellie Simmering  Procedure: Laser Ablation: right Greater Saphenous Vein  BP (!) 149/79 (BP Location: Left Arm, Patient Position: Sitting, Cuff Size: Large)   Pulse 81   Temp 98.7 F (37.1 C) (Oral)   Resp 16   Ht 5\' 1"  (1.549 m)   Wt 168 lb (76.2 kg)   SpO2 99%   BMI 31.74 kg/m   Tumescent Anesthesia: 325 cc 0.9% NaCl with 50 cc Lidocaine HCL with 1% Epi and 15 cc 8.4% NaHCO3  Local Anesthesia: 3 cc Lidocaine HCL and NaHCO3 (ratio 2:1)  Pulsed Mode: 15 watts, 567ms delay, 1.0 duration  Total Energy:  1377 Joules            Total Pulses: 92               Total Time:  1:32      Patient tolerated procedure well    Description of Procedure:  After marking the course of the secondary varicosities, the patient was placed on the operating table in the supine position, and the right leg was prepped and draped in sterile fashion.   Local anesthetic was administered and under ultrasound guidance the saphenous vein was accessed with a micro needle and guide wire; then the mirco puncture sheath was placed.  A guide wire was inserted saphenofemoral junction , followed by a 5 french sheath.  The position of the sheath and then the laser fiber below the junction was confirmed using the ultrasound.  Tumescent anesthesia was administered along the course of the saphenous vein using ultrasound guidance. The patient was placed in Trendelenburg position and protective laser glasses were placed on patient and staff, and the laser was fired at 15 watts continuous mode advancing 1-53mm/second for a total of 1377 joules.       Steri strip was applied to the IV insertion site and ABD pads and thigh high compression stockings were applied.  Ace wrap bandages were applied at the top of the saphenofemoral junction. Blood loss was less than 15 cc.  The patient ambulated out of the  operating room having tolerated the procedure well.

## 2016-08-17 NOTE — Progress Notes (Signed)
Subjective:     Patient ID: [name], female   DOB: [birth year], 66 y.o.   MRN: [id number]  HPI this 66 year old female had laser ablation right great saphenous vein performed under local tumescent anesthesia. A total of approximately 1400 J of energy was utilized. She tolerated the procedure well.   Review of Systems     Objective:   Physical Exam BP (!) 149/79 (BP Location: Left Arm, Patient Position: Sitting, Cuff Size: Large)   Pulse 81   Temp 98.7 F (37.1 C) (Oral)   Resp 16   Ht 5\' 1"  (1.549 m)   Wt 168 lb (76.2 kg)   SpO2 99%   BMI 31.74 kg/m        Assessment:     Well-tolerated laser ablation right great saphenous vein performed under local tumescent anesthesia    Plan:     Return in 1 week for venous duplex exam to confirm closure right great saphenous vein

## 2016-08-24 ENCOUNTER — Encounter (HOSPITAL_COMMUNITY): Payer: Medicare Other

## 2016-08-24 ENCOUNTER — Ambulatory Visit (INDEPENDENT_AMBULATORY_CARE_PROVIDER_SITE_OTHER): Payer: Medicare Other | Admitting: Vascular Surgery

## 2016-08-24 ENCOUNTER — Ambulatory Visit: Payer: Medicare Other | Admitting: Vascular Surgery

## 2016-08-24 ENCOUNTER — Encounter: Payer: Self-pay | Admitting: Vascular Surgery

## 2016-08-24 ENCOUNTER — Ambulatory Visit (HOSPITAL_COMMUNITY)
Admission: RE | Admit: 2016-08-24 | Discharge: 2016-08-24 | Disposition: A | Discharge status: Home / Self Care | Payer: Medicare Other | Source: Ambulatory Visit | Attending: Vascular Surgery | Admitting: Vascular Surgery

## 2016-08-24 VITALS — BP 136/85 | HR 60 | Temp 97.4°F | Resp 14 | Ht 60.1 in | Wt 170.0 lb

## 2016-08-24 DIAGNOSIS — I868 Varicose veins of other specified sites: Secondary | ICD-10-CM

## 2016-08-24 DIAGNOSIS — I83891 Varicose veins of right lower extremities with other complications: Secondary | ICD-10-CM | POA: Diagnosis not present

## 2016-08-24 DIAGNOSIS — I824Y1 Acute embolism and thrombosis of unspecified deep veins of right proximal lower extremity: Secondary | ICD-10-CM | POA: Diagnosis not present

## 2016-08-24 NOTE — Progress Notes (Signed)
Subjective:     Patient ID: [name], female   DOB: 25-Nov-1950, 66 y.o.   MRN: [id number]  HPI This 66 year old female returns 1 week post-laser ablation right great saphenous vein performed for gross reflux with painful varicosities. She has worn her long leg elastic compression stocking and taken ibuprofen as instructed. She states that the swelling in the right ankle is about the same. She has had some mild discomfort in the thigh but that is now resolving.  Past Medical History:  Diagnosis Date  . Allergy    Zyrtec PRN  . Anemia   . Cataract   . Colon polyp 03/11/2007  . Diverticulosis    by colonoscopy 2009  . Fibromyalgia    diagnosed by Dr. Teressa Lower. s/p rheumatology consultation  . GERD (gastroesophageal reflux disease)   . Hyperlipidemia   . Hypertension   . IBS (irritable bowel syndrome)    diarrhea, constipation alternating; s/p GI consult.  . Internal hemorrhoid    colonoscopy in 2009    Social History  Substance Use Topics  . Smoking status: Former Smoker    Packs/day: 2.00    Years: 27.00    Quit date: 10/25/1994  . Smokeless tobacco: Never Used  . Alcohol use No    Family History  Problem Relation Age of Onset  . Hypertension Mother   . Stroke Mother        multiple CVAs/TIAs  . Heart disease Mother 110       CHF  . Cancer Father        prostate cancer  . Hypertension Sister   . Hyperlipidemia Sister   . Glaucoma Sister   . Hypertension Brother   . Prostatitis Brother   . Hypertension Maternal Grandmother   . Hypertension Maternal Grandfather   . Drug abuse Paternal Grandmother   . Hypertension Sister   . Mental illness Sister   . Glaucoma Sister   . Colon cancer Cousin   . Pancreatic cancer Cousin   . Esophageal cancer Neg Hx   . Rectal cancer Neg Hx   . Stomach cancer Neg Hx     Allergies  Allergen Reactions  . Aspirin     My stomach burns and a "little bit of feeling like hard to breath".  . Pollen Extract     Skin itching,  runny eyes, sinus pressure  . Sulfa Antibiotics     itching     Current Outpatient Prescriptions:  .  amLODipine (NORVASC) 2.5 MG tablet, Take 1 tablet (2.5 mg total) by mouth daily., Disp: 90 tablet, Rfl: 1 .  hydrocortisone (ANUSOL-HC) 25 MG suppository, Place 1 suppository (25 mg total) rectally 2 (two) times daily., Disp: 12 suppository, Rfl: 3 .  hydrocortisone (PROCTOCORT) 1 % CREA, Apply 1 application topically 3 (three) times daily as needed., Disp: 28 g, Rfl: 2 .  hyoscyamine (LEVBID) 0.375 MG 12 hr tablet, Take 1 tablet (0.375 mg total) by mouth every 12 (twelve) hours as needed. (Patient not taking: Reported on 08/02/2016), Disp: 60 tablet, Rfl: 0 .  losartan (COZAAR) 50 MG tablet, take 1 tablet by mouth once daily (Patient not taking: Reported on 07/12/2016), Disp: 90 tablet, Rfl: 0 .  meloxicam (MOBIC) 7.5 MG tablet, Take 1 tablet (7.5 mg total) by mouth daily. (Patient not taking: Reported on 07/12/2016), Disp: 30 tablet, Rfl: 0  Vitals:   08/24/16 1541 08/24/16 1544  BP: (!) 179/86 136/85  Pulse: 60 60  Resp: 14   Temp: (!) 97.4  F (36.3 C)   TempSrc: Oral   SpO2: 100%   Weight: 170 lb (77.1 kg)   Height: 5' 0.1" (1.527 m)     Body mass index is 33.09 kg/m.         Review of Systems Denies chest pain, dyspnea on exertion, PND, orthopnea, hemoptysis, claudication    Objective:   Physical Exam BP 136/85 (BP Location: Left Arm, Patient Position: Sitting, Cuff Size: Normal)   Pulse 60   Temp (!) 97.4 F (36.3 C) (Oral)   Resp 14   Ht 5' 0.1" (1.527 m)   Wt 170 lb (77.1 kg)   SpO2 100%   BMI 33.09 kg/m   Gen. well-developed well-nourished female no apparent distress alert and oriented 3 Lungs no rhonchi or wheezing Cardiovascular regular rhythm no murmurs Right leg with moderate ecchymosis mid to distal thigh medially over great saphenous vein. No hematoma palpable. Vein is mildly tender to deep palpation. 1+ distal edema.  Today I ordered a venous  duplex exam the right leg which I reviewed and interpreted. There is no DVT. There is total closure of the great saphenous vein up to near the saphenofemoral junction.     Assessment:     Successful laser ablation right great saphenous vein for gross reflux with painful varicosities    Plan:     Return in 3 months and will evaluate for potential need for multiple stab phlebectomy and/or sclerotherapy that time

## 2016-08-31 ENCOUNTER — Ambulatory Visit
Admission: RE | Admit: 2016-08-31 | Discharge: 2016-08-31 | Disposition: A | Discharge status: Home / Self Care | Payer: Medicare Other | Source: Ambulatory Visit | Attending: Family Medicine | Admitting: Family Medicine

## 2016-08-31 DIAGNOSIS — M8589 Other specified disorders of bone density and structure, multiple sites: Secondary | ICD-10-CM | POA: Diagnosis not present

## 2016-08-31 DIAGNOSIS — E2839 Other primary ovarian failure: Secondary | ICD-10-CM

## 2016-08-31 DIAGNOSIS — Z780 Asymptomatic menopausal state: Secondary | ICD-10-CM | POA: Diagnosis not present

## 2016-09-13 NOTE — Progress Notes (Signed)
Subjective:    Patient ID: [name], female    DOB: [birth year], 66 y.o.   MRN: [id number]  09/14/2016  Hypertension (2 month follow-up); Urinary Incontinence; and Hemorrhoids   HPI This 66 y.o. female presents for two month follow-up of hypertension, hypercholesterolemia, varicose veins, IBS, urinary incontienence.  Management changes made at last visit included referring to gastroenterology, vascular surgery, and urology.  Blood pressure yesterday 148/80s while out at pharmacy. Taking Amlodipine 2.5mg  qhs; must lay down immediately.  A little groggy in mornings.  Not feeling better on Amlodipine.   Previous Lisinopril caused cough.  Losartan caused joint aches. Walking sporadically; previously walked 3-4 times per week for 30-40 minutes; also hiking around trails. Eats a lot of hamburgers for lunch; has those a lot for lunch; eating french fries.  Energy drains from 1:00-3:00pm. Skips lunch a lot.   Not sleeping very well; never has slept well.  Usually getting 4-6 hours per night.  Stays up until 5:00am; wakes up every 2 hours.  Never sleeps through the night.  Rarely naps unless in car.  Previous event monitor with palpitations in the past; 24 hour monitor; six years ago.  Palpitations seem different.    Notices palpitations in chest; not sure what that means.  S/p vascular consultation with mild improvement of leg swelling RIGHT.  Swelling may not improve completely.  S/p laser ablation of the RIGHT great saphenous vein for gross reflux.  Wearing compression stockings. S/p venous duplex of RLE on 08/24/16 by Dr. Kellie Simmering.  To follow-up in three months.   Urinary incontinence: still awaiting appointment.  IBS: appointment this month.     BP Readings from Last 3 Encounters:  09/14/16 118/74  08/24/16 136/85  08/17/16 (!) 149/79   Wt Readings from Last 3 Encounters:  09/14/16 169 lb 6.4 oz (76.8 kg)  08/24/16 170 lb (77.1 kg)  08/17/16 168 lb (76.2 kg)   Immunization  History  Administered Date(s) Administered  . Influenza,inj,Quad PF,6+ Mos 11/10/2015, 09/14/2016  . Influenza-Unspecified 03/14/2015  . Pneumococcal Conjugate-13 03/14/2015  . Pneumococcal Polysaccharide-23 07/12/2016  . Tdap 12/04/2013    Review of Systems  Constitutional: Negative for chills, diaphoresis, fatigue and fever.  Eyes: Negative for visual disturbance.  Respiratory: Negative for cough and shortness of breath.   Cardiovascular: Positive for palpitations and leg swelling. Negative for chest pain.  Gastrointestinal: Negative for abdominal pain, constipation, diarrhea, nausea and vomiting.  Endocrine: Negative for cold intolerance, heat intolerance, polydipsia, polyphagia and polyuria.  Neurological: Negative for dizziness, tremors, seizures, syncope, facial asymmetry, speech difficulty, weakness, light-headedness, numbness and headaches.  Psychiatric/Behavioral: Positive for sleep disturbance. Negative for dysphoric mood. The patient is not nervous/anxious.     Past Medical History:  Diagnosis Date  . Allergy    Zyrtec PRN  . Anemia   . Cataract   . Colon polyp 03/11/2007  . Diverticulosis    by colonoscopy 2009  . Fibromyalgia    diagnosed by Dr. Teressa Lower. s/p rheumatology consultation  . GERD (gastroesophageal reflux disease)   . Hyperlipidemia   . Hypertension   . IBS (irritable bowel syndrome)    diarrhea, constipation alternating; s/p GI consult.  . Internal hemorrhoid    colonoscopy in 2009   Past Surgical History:  Procedure Laterality Date  . CESAREAN SECTION    . COLONOSCOPY    . ENDOVENOUS ABLATION SAPHENOUS VEIN W/ LASER Right 08/17/2016   endovenous laser ablation right greater saphenous vein by Tinnie Gens MD   .  MYOMECTOMY    . TUBAL LIGATION    . UPPER GASTROINTESTINAL ENDOSCOPY     Allergies  Allergen Reactions  . Aspirin     My stomach burns and a "little bit of feeling like hard to breath".  . Pollen Extract     Skin itching, runny  eyes, sinus pressure  . Sulfa Antibiotics     itching   Current Outpatient Prescriptions  Medication Sig Dispense Refill  . amLODipine (NORVASC) 2.5 MG tablet Take 1 tablet (2.5 mg total) by mouth daily. 90 tablet 1  . hydrocortisone (ANUSOL-HC) 25 MG suppository Place 1 suppository (25 mg total) rectally 2 (two) times daily. 12 suppository 3  . hydrocortisone (PROCTOCORT) 1 % CREA Apply 1 application topically 3 (three) times daily as needed. 28 g 2  . hyoscyamine (LEVBID) 0.375 MG 12 hr tablet Take 1 tablet (0.375 mg total) by mouth every 12 (twelve) hours as needed. 60 tablet 0  . rosuvastatin (CRESTOR) 5 MG tablet Take 1 tablet (5 mg total) by mouth once a week. 30 tablet 1   No current facility-administered medications for this visit.    Social History   Social History  . Marital status: Single    Spouse name: N/A  . Number of children: N/A  . Years of education: N/A   Occupational History  . Not on file.   Social History Main Topics  . Smoking status: Former Smoker    Packs/day: 2.00    Years: 27.00    Quit date: 10/25/1994  . Smokeless tobacco: Never Used  . Alcohol use No  . Drug use: No  . Sexual activity: Yes   Other Topics Concern  . Not on file   Social History Narrative   Marital status: married x 1969; happily married; no abuse     Children: 3 children; 1 grandchildren (46yo).      Lives: with husband, son, daughter      Employment:  Homemaker      Tobacco: quit smoking in 1997; smoked x 30 years.      Alcohol: none      Drugs: none      Exercise: walking three days per week 2018      Seatbelt: 100%     Guns:  None     ADLs: independent with ADLs; no assistant device      Advance Directives: none; desires FULL CODE; no prolonged measures.   Family History  Problem Relation Age of Onset  . Hypertension Mother   . Stroke Mother        multiple CVAs/TIAs  . Heart disease Mother 24       CHF  . Cancer Father        prostate cancer  . Hypertension  Sister   . Hyperlipidemia Sister   . Glaucoma Sister   . Hypertension Brother   . Prostatitis Brother   . Hypertension Maternal Grandmother   . Hypertension Maternal Grandfather   . Drug abuse Paternal Grandmother   . Hypertension Sister   . Mental illness Sister   . Glaucoma Sister   . Colon cancer Cousin   . Pancreatic cancer Cousin   . Esophageal cancer Neg Hx   . Rectal cancer Neg Hx   . Stomach cancer Neg Hx        Objective:    BP 118/74   Pulse 83   Temp 98.6 F (37 C) (Oral)   Resp 16   Ht 5' 3.78" (1.62 m)  Wt 169 lb 6.4 oz (76.8 kg)   SpO2 98%   BMI 29.28 kg/m  Physical Exam  Constitutional: She is oriented to person, place, and time. She appears well-developed and well-nourished. No distress.  HENT:  Head: Normocephalic and atraumatic.  Right Ear: External ear normal.  Left Ear: External ear normal.  Nose: Nose normal.  Mouth/Throat: Oropharynx is clear and moist.  Eyes: Pupils are equal, round, and reactive to light. Conjunctivae and EOM are normal.  Neck: Normal range of motion. Neck supple. Carotid bruit is not present. No thyromegaly present.  Cardiovascular: Normal rate, regular rhythm, normal heart sounds and intact distal pulses.  Exam reveals no gallop and no friction rub.   No murmur heard. Tace to 1+ pitting edema RLE>LLE.  Pulmonary/Chest: Effort normal and breath sounds normal. She has no wheezes. She has no rales.  Abdominal: Soft. Bowel sounds are normal. She exhibits no distension and no mass. There is no tenderness. There is no rebound and no guarding.  Musculoskeletal: She exhibits edema.  Lymphadenopathy:    She has no cervical adenopathy.  Neurological: She is alert and oriented to person, place, and time. No cranial nerve deficit.  Skin: Skin is warm and dry. No rash noted. She is not diaphoretic. No erythema. No pallor.  Psychiatric: She has a normal mood and affect. Her behavior is normal.    No results found. Depression screen  [facility] 2/9 09/14/2016 07/12/2016 11/10/2015 07/22/2015 03/18/2015  Decreased Interest 0 0 0 1 0  Down, Depressed, Hopeless 0 0 0 1 0  PHQ - 2 Score 0 0 0 2 0  Altered sleeping - - - 3 -  Tired, decreased energy - - - 1 -  Change in appetite - - - 0 -  Feeling bad or failure about yourself  - - - 0 -  Trouble concentrating - - - 3 -  Moving slowly or fidgety/restless - - - 0 -  Suicidal thoughts - - - 0 -  PHQ-9 Score - - - 9 -   Fall Risk  09/14/2016 07/12/2016 11/10/2015 07/22/2015 03/18/2015  Falls in the past year? No No No No No        Assessment & Plan:   1. Essential hypertension   2. Varicose veins of right lower extremity with complications   3. Glucose intolerance (impaired glucose tolerance)   4. Pure hypercholesterolemia   5. Need for influenza vaccination   6. Irritable bowel syndrome with diarrhea   7. Urge incontinence of urine    -moderately controlled blood pressure but suffering with fatigue on current dose of Amlodipine; recommend exercise, weight loss, and low-sodium diet; no changes to medication at this time.  Intolerant to Lisinopril and Losartan in the past.   -agreeable to trial of once weekly Crestor 5mg  for hypercholesterolemia uncontrolled.  -s/p vascular laser ablation of great saphenous vein for chronic venous insufficiency RLE. -awaiting urology consultation for overactive bladder. -awaiting appointment for IBS.   Orders Placed This Encounter  Procedures  . Flu Vaccine QUAD 36+ mos IM   Meds ordered this encounter  Medications  . rosuvastatin (CRESTOR) 5 MG tablet    Sig: Take 1 tablet (5 mg total) by mouth once a week.    Dispense:  30 tablet    Refill:  1    Return in about 3 months (around 12/14/2016) for recheck cholesterol, high blood pressure.   Monick Rena Elayne Guerin, M.D. Primary Care at [facility] [address] [place], [place]  [address] [phone] phone [phone] fax

## 2016-09-14 ENCOUNTER — Ambulatory Visit (INDEPENDENT_AMBULATORY_CARE_PROVIDER_SITE_OTHER): Payer: Medicare Other | Admitting: Family Medicine

## 2016-09-14 ENCOUNTER — Encounter: Payer: Self-pay | Admitting: Family Medicine

## 2016-09-14 VITALS — BP 118/74 | HR 83 | Temp 98.6°F | Resp 16 | Ht 63.78 in | Wt 169.4 lb

## 2016-09-14 DIAGNOSIS — I83891 Varicose veins of right lower extremities with other complications: Secondary | ICD-10-CM | POA: Diagnosis not present

## 2016-09-14 DIAGNOSIS — Z23 Encounter for immunization: Secondary | ICD-10-CM | POA: Diagnosis not present

## 2016-09-14 DIAGNOSIS — I10 Essential (primary) hypertension: Secondary | ICD-10-CM

## 2016-09-14 DIAGNOSIS — K580 Irritable bowel syndrome with diarrhea: Secondary | ICD-10-CM

## 2016-09-14 DIAGNOSIS — N3941 Urge incontinence: Secondary | ICD-10-CM

## 2016-09-14 DIAGNOSIS — E7800 Pure hypercholesterolemia, unspecified: Secondary | ICD-10-CM

## 2016-09-14 DIAGNOSIS — R7302 Impaired glucose tolerance (oral): Secondary | ICD-10-CM

## 2016-09-14 MED ORDER — ROSUVASTATIN CALCIUM 5 MG PO TABS: 5.0000 mg | ORAL_TABLET | ORAL | 1 refills | Status: DC

## 2016-09-14 NOTE — Patient Instructions (Addendum)
   IF you received an x-ray today, you will receive an invoice from [facility]. Please contact  Radiology at [phone] with questions or concerns regarding your invoice.   IF you received labwork today, you will receive an invoice from [facility]. Please contact [facility] at [phone] with questions or concerns regarding your invoice.   Our billing staff will not be able to assist you with questions regarding bills from these companies.  You will be contacted with the lab results as soon as they are available. The fastest way to get your results is to activate your My Chart account. Instructions are located on the last page of this paperwork. If you have not heard from us regarding the results in 2 weeks, please contact this office.      Fat and Cholesterol Restricted Diet Getting too much fat and cholesterol in your diet may cause health problems. Following this diet helps keep your fat and cholesterol at normal levels. This can keep you from getting sick. What types of fat should I choose?  Choose monosaturated and polyunsaturated fats. These are found in foods such as olive oil, canola oil, flaxseeds, walnuts, almonds, and seeds.  Eat more omega-3 fats. Good choices include salmon, mackerel, sardines, tuna, flaxseed oil, and ground flaxseeds.  Limit saturated fats. These are in animal products such as meats, butter, and cream. They can also be in plant products such as palm oil, palm kernel oil, and coconut oil.  Avoid foods with partially hydrogenated oils in them. These contain trans fats. Examples of foods that have trans fats are stick margarine, some tub margarines, cookies, crackers, and other baked goods. What general guidelines do I need to follow?  Check food labels. Look for the words "trans fat" and "saturated fat."  When preparing a meal: ? Fill half of your plate with vegetables and green salads. ? Fill one fourth of your plate with whole  grains. Look for the word "whole" as the first word in the ingredient list. ? Fill one fourth of your plate with lean protein foods.  Eat more foods that have fiber, like apples, carrots, beans, peas, and barley.  Eat more home-cooked foods. Eat less at restaurants and buffets.  Limit or avoid alcohol.  Limit foods high in starch and sugar.  Limit fried foods.  Cook foods without frying them. Baking, boiling, grilling, and broiling are all great options.  Lose weight if you are overweight. Losing even a small amount of weight can help your overall health. It can also help prevent diseases such as diabetes and heart disease. What foods can I eat? Grains Whole grains, such as whole wheat or whole grain breads, crackers, cereals, and pasta. Unsweetened oatmeal, bulgur, barley, quinoa, or brown rice. Corn or whole wheat flour tortillas. Vegetables Fresh or frozen vegetables (raw, steamed, roasted, or grilled). Green salads. Fruits All fresh, canned (in natural juice), or frozen fruits. Meat and Other Protein Products Ground beef (85% or leaner), grass-fed beef, or beef trimmed of fat. Skinless chicken or turkey. Ground chicken or turkey. Pork trimmed of fat. All fish and seafood. Eggs. Dried beans, peas, or lentils. Unsalted nuts or seeds. Unsalted canned or dry beans. Dairy Low-fat dairy products, such as skim or 1% milk, 2% or reduced-fat cheeses, low-fat ricotta or cottage cheese, or plain low-fat yogurt. Fats and Oils Tub margarines without trans fats. Light or reduced-fat mayonnaise and salad dressings. Avocado. Olive, canola, sesame, or safflower oils. Natural peanut or almond butter (choose ones without   added sugar and oil). The items listed above may not be a complete list of recommended foods or beverages. Contact your dietitian for more options. What foods are not recommended? Grains White bread. White pasta. White rice. Cornbread. Bagels, pastries, and croissants. Crackers that  contain trans fat. Vegetables White potatoes. Corn. Creamed or fried vegetables. Vegetables in a cheese sauce. Fruits Dried fruits. Canned fruit in light or heavy syrup. Fruit juice. Meat and Other Protein Products Fatty cuts of meat. Ribs, chicken wings, bacon, sausage, bologna, salami, chitterlings, fatback, hot dogs, bratwurst, and packaged luncheon meats. Liver and organ meats. Dairy Whole or 2% milk, cream, half-and-half, and cream cheese. Whole milk cheeses. Whole-fat or sweetened yogurt. Full-fat cheeses. Nondairy creamers and whipped toppings. Processed cheese, cheese spreads, or cheese curds. Sweets and Desserts Corn syrup, sugars, honey, and molasses. Candy. Jam and jelly. Syrup. Sweetened cereals. Cookies, pies, cakes, donuts, muffins, and ice cream. Fats and Oils Butter, stick margarine, lard, shortening, ghee, or bacon fat. Coconut, palm kernel, or palm oils. Beverages Alcohol. Sweetened drinks (such as sodas, lemonade, and fruit drinks or punches). The items listed above may not be a complete list of foods and beverages to avoid. Contact your dietitian for more information. This information is not intended to replace advice given to you by your health care provider. Make sure you discuss any questions you have with your health care provider. Document Released: 06/29/2011 Document Revised: 09/04/2015 Document Reviewed: 03/29/2013 Elsevier Interactive Patient Education  2018 Elsevier Inc.  

## 2016-09-18 DIAGNOSIS — N3941 Urge incontinence: Secondary | ICD-10-CM | POA: Insufficient documentation

## 2016-09-18 DIAGNOSIS — K580 Irritable bowel syndrome with diarrhea: Secondary | ICD-10-CM | POA: Insufficient documentation

## 2016-09-28 ENCOUNTER — Other Ambulatory Visit (INDEPENDENT_AMBULATORY_CARE_PROVIDER_SITE_OTHER): Payer: Medicare Other

## 2016-09-28 ENCOUNTER — Ambulatory Visit (INDEPENDENT_AMBULATORY_CARE_PROVIDER_SITE_OTHER): Payer: Medicare Other | Admitting: Gastroenterology

## 2016-09-28 ENCOUNTER — Encounter: Payer: Self-pay | Admitting: Gastroenterology

## 2016-09-28 VITALS — BP 144/86 | HR 69 | Ht 61.0 in | Wt 171.0 lb

## 2016-09-28 DIAGNOSIS — K582 Mixed irritable bowel syndrome: Secondary | ICD-10-CM | POA: Diagnosis not present

## 2016-09-28 DIAGNOSIS — K644 Residual hemorrhoidal skin tags: Secondary | ICD-10-CM | POA: Diagnosis not present

## 2016-09-28 DIAGNOSIS — K219 Gastro-esophageal reflux disease without esophagitis: Secondary | ICD-10-CM

## 2016-09-28 LAB — IGA: IGA: 112 mg/dL (ref 68–378)

## 2016-09-28 MED ORDER — RANITIDINE HCL 300 MG PO TABS: 300.0000 mg | ORAL_TABLET | Freq: Every day | ORAL | 11 refills | Status: DC

## 2016-09-28 MED ORDER — LIDOCAINE (ANORECTAL) 5 % EX CREA: TOPICAL_CREAM | CUTANEOUS | 5 refills | Status: DC

## 2016-09-28 MED ORDER — GLYCOPYRROLATE 1 MG PO TABS: 1.0000 mg | ORAL_TABLET | Freq: Two times a day (BID) | ORAL | 5 refills | Status: DC

## 2016-09-28 NOTE — Patient Instructions (Signed)
Your physician has requested that you go to the basement for lab work before leaving today.  We have sent the following medications to your pharmacy for you to pick up at your convenience: Recticare, Robinul, and Zantac.   Patient advised to avoid spicy, acidic, citrus, chocolate, mints, fruit and fruit juices.  Limit the intake of caffeine, alcohol and Soda.  Don't exercise too soon after eating.  Don't lie down within 3-4 hours of eating.  Elevate the head of your bed.  Start over the [employer] daily.  RECTAL CARE INSTRUCTIONS:  1. Sitz Baths twice a day for 10 minutes each. 2. Thoroughly clean and dry the rectum. 3. Put Tucks pad against the rectum at night. 4. Clean the rectum with Balenol lotion after each bowel movement.  You have been scheduled for an appointment with ____________ at [facility]. Your appointment is on ______ at _______. Please arrive at ________ for registration. Make certain to bring a list of current medications, including any over the counter medications or vitamins. Also bring your co-pay if you have one as well as your insurance cards. [facility] is located at [address], [address]. Should you need to reschedule your appointment, please contact them at [phone].  Normal BMI (Body Mass Index- based on height and weight) is between 23 and 30. Your BMI today is Body mass index is 32.31 kg/m. Marland Kitchen Please consider follow up  regarding your BMI with your Primary Care Provider.  Thank you for choosing me and [facility].  Pricilla Riffle. Dagoberto Ligas., MD., Marval Regal

## 2016-09-28 NOTE — Progress Notes (Signed)
    History of Present Illness: This is a 66 year old female with frequent LLQ pain, diarrhea, constipation, GERD, hemorrhoidal swelling and bleeding. Symptoms present for many years without changes. A few dietary stressors. Intolerant to Miralax and Citrucel. She has had frequent left lower quadrant pain associated with either diarrhea or constipation. She uses stool softeners regularly but they are only minimally helpful for constipation. She complains of frequent hemorrhoidal swelling and bleeding and has been using hydrocortisone recently without improvement in symptoms. She notes about 3-4 times per month significant substernal heartburn which is helped with when necessary use of Zantac. Occasionally in between his more severe episodes she will have milder heartburn. Denies weight loss, change in stool caliber, melena, hematochezia, nausea, vomiting, dysphagia, chest pain.  CBC, CMP, TSH in 07/2016 were unremarkable.   Colonoscopy 05/2015:  - Non-thrombosed external hemorrhoids found on perianal exam. - Moderate diverticulosis in the sigmoid colon and in the descending colon. - Moderate diverticulosis in the transverse colon, at the hepatic flexure and in the ascending colon.  Current Medications, Allergies, Past Medical History, Past Surgical History, Family History and Social History were reviewed in [employer] record.  Physical Exam: General: Well developed, well nourished, no acute distress Head: Normocephalic and atraumatic Eyes:  sclerae anicteric, EOMI Ears: Normal auditory acuity Mouth: No deformity or lesions Lungs: Clear throughout to auscultation Heart: Regular rate and rhythm; no murmurs, rubs or bruits Abdomen: Soft, non tender and non distended. No masses, hepatosplenomegaly or hernias noted. Normal Bowel sounds Rectal: Large external hemorrhoids: erythematous and tender, possible prolapsed internal hemorrhoid, no internal lesions palpated  Hemoccult-negative stool in the vault Musculoskeletal: Symmetrical with no gross deformities  Pulses:  Normal pulses noted Extremities: No clubbing, cyanosis, edema or deformities noted Neurological: Alert oriented x 4, grossly nonfocal Psychological:  Alert and cooperative. Normal mood and affect  Assessment and Recommendations:  1. IBS with alternating bowel pattern. Check IgA, tTG. Benefiber daily. Avoid stressor foods. Trial of a lactose free diet for one week. Glycopyrrolate 1 mg twice daily. Consider low FODMAP diet. REV in 6 weeks.   2. GERD. Follow standard antireflux measures. Begin ranitidine 300 mg daily. May take a second daily dose if breakthrough symptoms occur. REV in 6 weeks.   3. Hemorrhoids, large external and possible prolapsing internal. Recticare and rectal care instructions. Referral to Dr. Marcello Moores for further mgmt.

## 2016-09-29 LAB — TISSUE TRANSGLUTAMINASE, IGA: (TTG) AB, IGA: 1 U/mL

## 2016-09-30 ENCOUNTER — Telehealth: Payer: Self-pay

## 2016-09-30 NOTE — Telephone Encounter (Signed)
Left a message for patient to return my call. 

## 2016-09-30 NOTE — Telephone Encounter (Signed)
After initiating PA for robinul 1 mg tablets, received fax today stating PA was denied. What medication would you like to switch patient to?

## 2016-09-30 NOTE — Telephone Encounter (Signed)
Dicyclomine 10 mg po tid ac 

## 2016-10-01 MED ORDER — DICYCLOMINE HCL 10 MG PO CAPS: 10.0000 mg | ORAL_CAPSULE | Freq: Three times a day (TID) | ORAL | 2 refills | Status: DC

## 2016-10-01 NOTE — Telephone Encounter (Signed)
I have spoken to patient to advise that insurance denied robinul but that we will send dicyclomine in its place. She verbalizes understanding. Dicyclomine sent to pharmacy.

## 2016-10-04 ENCOUNTER — Telehealth: Payer: Self-pay

## 2016-10-04 NOTE — Telephone Encounter (Signed)
Left a message for patient to return my call regarding appt at [facility] with Dr. Marcello Moores.

## 2016-10-04 NOTE — Telephone Encounter (Signed)
Verified that patient got the message from [place] regarding appt date and time. Patient states her [facility] appt is on 10/26/16 at 10:30am with Dr. Marcello Moores.

## 2016-10-26 DIAGNOSIS — K643 Fourth degree hemorrhoids: Secondary | ICD-10-CM | POA: Diagnosis not present

## 2016-11-18 ENCOUNTER — Ambulatory Visit: Payer: Medicare Other | Admitting: Gastroenterology

## 2016-11-30 ENCOUNTER — Ambulatory Visit: Payer: Medicare Other | Admitting: Vascular Surgery

## 2016-12-15 ENCOUNTER — Ambulatory Visit (INDEPENDENT_AMBULATORY_CARE_PROVIDER_SITE_OTHER): Payer: Medicare Other | Admitting: Family Medicine

## 2016-12-15 ENCOUNTER — Encounter: Payer: Self-pay | Admitting: Family Medicine

## 2016-12-15 ENCOUNTER — Other Ambulatory Visit: Payer: Self-pay

## 2016-12-15 VITALS — BP 140/82 | HR 70 | Temp 98.2°F | Resp 16 | Ht 62.6 in | Wt 171.0 lb

## 2016-12-15 DIAGNOSIS — E7800 Pure hypercholesterolemia, unspecified: Secondary | ICD-10-CM | POA: Diagnosis not present

## 2016-12-15 DIAGNOSIS — R7302 Impaired glucose tolerance (oral): Secondary | ICD-10-CM

## 2016-12-15 DIAGNOSIS — I10 Essential (primary) hypertension: Secondary | ICD-10-CM

## 2016-12-15 DIAGNOSIS — N3941 Urge incontinence: Secondary | ICD-10-CM

## 2016-12-15 DIAGNOSIS — K580 Irritable bowel syndrome with diarrhea: Secondary | ICD-10-CM

## 2016-12-15 MED ORDER — PRAVASTATIN SODIUM 20 MG PO TABS: 20.0000 mg | ORAL_TABLET | ORAL | 3 refills | Status: DC

## 2016-12-15 MED ORDER — METOPROLOL SUCCINATE ER 25 MG PO TB24: 25.0000 mg | ORAL_TABLET | Freq: Every day | ORAL | 1 refills | Status: DC

## 2016-12-15 NOTE — Patient Instructions (Addendum)
  Stop Amlodipine. Start Metoprolol ER 25mg  daily. Stop Crestor. Start Pravastatin next month.   IF you received an x-ray today, you will receive an invoice from [facility]. Please contact [facility] at [phone] with questions or concerns regarding your invoice.   IF you received labwork today, you will receive an invoice from [facility]. Please contact [facility] at [phone] with questions or concerns regarding your invoice.   Our billing staff will not be able to assist you with questions regarding bills from these companies.  You will be contacted with the lab results as soon as they are available. The fastest way to get your results is to activate your My Chart account. Instructions are located on the last page of this paperwork. If you have not heard from [place] regarding the results in 2 weeks, please contact this office.

## 2016-12-15 NOTE — Progress Notes (Signed)
Subjective:    Patient ID: [name], female    DOB: 1950-05-25, 66 y.o.   MRN: [id number]  12/15/2016  Hypertension (3 month follow-up ) and Hyperlipidemia    HPI This 66 y.o. female presents for three month follow-up of hypertension, hypercholesterolemia. Management changes made at last visit include: -moderately controlled blood pressure but suffering with fatigue on current dose of Amlodipine; recommend exercise, weight loss, and low-sodium diet; no changes to medication at this time.  Intolerant to Lisinopril and Losartan in the past.   -agreeable to trial of once weekly Crestor 5mg  for hypercholesterolemia uncontrolled.  -s/p vascular laser ablation of great saphenous vein for chronic venous insufficiency RLE. -awaiting urology consultation for overactive bladder. -awaiting appointment for IBS.   Taking Crestor once weekly at night; feels really weird; gets a headache; makes legs feel heavy; cannot get words out.   Notification that Amlodipine recalled due to Valsartan recall. No reaction to diuretic in the past but not sure why discontinued.   Was exercising three times per week yet now weather is cold. Checking BP at home; running fluctuating.    Did not keep appointment with urology. Had to cancel appointment with gastroenterologist as well.  Blurred vision; needs to call ophthalmologist. Losing hearing for past year. RIGHT.   BP Readings from Last 3 Encounters:  12/15/16 140/82  09/28/16 (!) 144/86  09/14/16 118/74   Wt Readings from Last 3 Encounters:  12/15/16 171 lb (77.6 kg)  09/28/16 171 lb (77.6 kg)  09/14/16 169 lb 6.4 oz (76.8 kg)   Immunization History  Administered Date(s) Administered  . Influenza,inj,Quad PF,6+ Mos 11/10/2015, 09/14/2016  . Influenza-Unspecified 03/14/2015  . Pneumococcal Conjugate-13 03/14/2015  . Pneumococcal Polysaccharide-23 07/12/2016  . Tdap 12/04/2013    Review of Systems  Constitutional: Positive for fatigue.  Negative for chills, diaphoresis and fever.  Eyes: Negative for visual disturbance.  Respiratory: Negative for cough and shortness of breath.   Cardiovascular: Negative for chest pain, palpitations and leg swelling.  Gastrointestinal: Negative for abdominal pain, constipation, diarrhea, nausea and vomiting.  Endocrine: Negative for cold intolerance, heat intolerance, polydipsia, polyphagia and polyuria.  Musculoskeletal: Positive for myalgias.  Neurological: Negative for dizziness, tremors, seizures, syncope, facial asymmetry, speech difficulty, weakness, light-headedness, numbness and headaches.    Past Medical History:  Diagnosis Date  . Allergy    Zyrtec PRN  . Anemia   . Cataract   . Colon polyp 03/11/2007  . Diverticulosis    by colonoscopy 2009  . Fibromyalgia    diagnosed by Dr. Teressa Lower. s/p rheumatology consultation  . GERD (gastroesophageal reflux disease)   . Hyperlipidemia   . Hypertension   . IBS (irritable bowel syndrome)    diarrhea, constipation alternating; s/p GI consult.  . Internal hemorrhoid    colonoscopy in 2009   Past Surgical History:  Procedure Laterality Date  . CESAREAN SECTION    . COLONOSCOPY    . ENDOVENOUS ABLATION SAPHENOUS VEIN W/ LASER Right 08/17/2016   endovenous laser ablation right greater saphenous vein by Tinnie Gens MD   . MYOMECTOMY    . TUBAL LIGATION    . UPPER GASTROINTESTINAL ENDOSCOPY     Allergies  Allergen Reactions  . Aspirin     My stomach burns and a "little bit of feeling like hard to breath".  . Pollen Extract     Skin itching, runny eyes, sinus pressure  . Sulfa Antibiotics     itching   Current Outpatient Medications on File  Prior to Visit  Medication Sig Dispense Refill  . amLODipine (NORVASC) 2.5 MG tablet Take 1 tablet (2.5 mg total) by mouth daily. 90 tablet 1  . dicyclomine (BENTYL) 10 MG capsule Take 1 capsule (10 mg total) by mouth 3 (three) times daily before meals. 90 capsule 2  . hydrocortisone  (PROCTOCORT) 1 % CREA Apply 1 application topically 3 (three) times daily as needed. 28 g 2  . Lidocaine, Anorectal, (RECTICARE) 5 % CREA Apply rectally twice daily 30 g 5  . ranitidine (ZANTAC) 300 MG tablet Take 1 tablet (300 mg total) by mouth at bedtime. 30 tablet 11  . rosuvastatin (CRESTOR) 5 MG tablet Take 1 tablet (5 mg total) by mouth once a week. 30 tablet 1   No current facility-administered medications on file prior to visit.    Social History   Socioeconomic History  . Marital status: Single    Spouse name: Not on file  . Number of children: Not on file  . Years of education: Not on file  . Highest education level: Not on file  Social Needs  . Financial resource strain: Not on file  . Food insecurity - worry: Not on file  . Food insecurity - inability: Not on file  . Transportation needs - medical: Not on file  . Transportation needs - non-medical: Not on file  Occupational History  . Not on file  Tobacco Use  . Smoking status: Former Smoker    Packs/day: 2.00    Years: 27.00    Pack years: 54.00    Last attempt to quit: 10/25/1994    Years since quitting: 22.1  . Smokeless tobacco: Never Used  Substance and Sexual Activity  . Alcohol use: No    Alcohol/week: 0.0 oz  . Drug use: No  . Sexual activity: Yes  Other Topics Concern  . Not on file  Social History Narrative   Marital status: married x 1969; happily married; no abuse     Children: 3 children; 1 grandchildren (75yo).      Lives: with husband, son, daughter      Employment:  Homemaker      Tobacco: quit smoking in 1997; smoked x 30 years.      Alcohol: none      Drugs: none      Exercise: walking three days per week 2018      Seatbelt: 100%     Guns:  None     ADLs: independent with ADLs; no assistant device      Advance Directives: none; desires FULL CODE; no prolonged measures.   Family History  Problem Relation Age of Onset  . Hypertension Mother   . Stroke Mother        multiple  CVAs/TIAs  . Heart disease Mother 19       CHF  . Cancer Father        prostate cancer  . Hypertension Sister   . Hyperlipidemia Sister   . Glaucoma Sister   . Hypertension Brother   . Prostatitis Brother   . Hypertension Maternal Grandmother   . Hypertension Maternal Grandfather   . Drug abuse Paternal Grandmother   . Hypertension Sister   . Mental illness Sister   . Glaucoma Sister   . Colon cancer Cousin   . Pancreatic cancer Cousin   . Esophageal cancer Neg Hx   . Rectal cancer Neg Hx   . Stomach cancer Neg Hx        Objective:  BP 140/82   Pulse 70   Temp 98.2 F (36.8 C) (Oral)   Resp 16   Ht 5' 2.6" (1.59 m)   Wt 171 lb (77.6 kg)   SpO2 98%   BMI 30.68 kg/m  Physical Exam  Constitutional: She is oriented to person, place, and time. She appears well-developed and well-nourished. No distress.  HENT:  Head: Normocephalic and atraumatic.  Right Ear: External ear normal.  Left Ear: External ear normal.  Nose: Nose normal.  Mouth/Throat: Oropharynx is clear and moist.  Eyes: Conjunctivae and EOM are normal. Pupils are equal, round, and reactive to light.  Neck: Normal range of motion. Neck supple. Carotid bruit is not present. No thyromegaly present.  Cardiovascular: Normal rate, regular rhythm, normal heart sounds and intact distal pulses. Exam reveals no gallop and no friction rub.  No murmur heard. Pulmonary/Chest: Effort normal and breath sounds normal. She has no wheezes. She has no rales.  Abdominal: Soft. Bowel sounds are normal. She exhibits no distension and no mass. There is no tenderness. There is no rebound and no guarding.  Lymphadenopathy:    She has no cervical adenopathy.  Neurological: She is alert and oriented to person, place, and time. No cranial nerve deficit.  Skin: Skin is warm and dry. No rash noted. She is not diaphoretic. No erythema. No pallor.  Psychiatric: She has a normal mood and affect. Her behavior is normal.   No results  found. Depression screen [facility] 2/9 12/15/2016 09/14/2016 07/12/2016 11/10/2015 07/22/2015  Decreased Interest 0 0 0 0 1  Down, Depressed, Hopeless 0 0 0 0 1  PHQ - 2 Score 0 0 0 0 2  Altered sleeping - - - - 3  Tired, decreased energy - - - - 1  Change in appetite - - - - 0  Feeling bad or failure about yourself  - - - - 0  Trouble concentrating - - - - 3  Moving slowly or fidgety/restless - - - - 0  Suicidal thoughts - - - - 0  PHQ-9 Score - - - - 9   Fall Risk  12/15/2016 09/14/2016 07/12/2016 11/10/2015 07/22/2015  Falls in the past year? No No No No No        Assessment & Plan:   1. Pure hypercholesterolemia   2. Essential hypertension   3. Urge incontinence of urine   4. Glucose intolerance (impaired glucose tolerance)   5. Irritable bowel syndrome with diarrhea    Moderately controlled hypertension.  Patient requesting to discontinue amlodipine due to side effects.  Will discontinue amlodipine.  Prescription for metoprolol 25 mg daily provided.  Patient intolerant to Crestor once weekly.  Recommend trial of pravastatin therapy. Has upcoming appointments with urology and gastroenterology for urge incontinence and IBS with diarrhea.   I recommend weight loss, exercise, and low-carbohydrate low-sugar food choices. You should AVOID: regular sodas, sweetened tea, fruit juices.  You should LIMIT: breads, pastas, rice, potatoes, and desserts/sweets.  I would recommend limiting your total carbohydrate intake per meal to 45 grams; I would limit your total carbohydrate intake per snack to 30 grams.  I would also have a goal of 60 grams of protein intake per day; this would equal 10-15 grams of protein per meal and 5-10 grams of protein per snack.   Orders Placed This Encounter  Procedures  . CBC with Differential/Platelet  . Comprehensive metabolic panel    Order Specific Question:   Has the patient fasted?    Answer:  No  . Lipid panel    Order Specific Question:   Has the patient fasted?      Answer:   No   Meds ordered this encounter  Medications  . metoprolol succinate (TOPROL XL) 25 MG 24 hr tablet    Sig: Take 1 tablet (25 mg total) by mouth daily.    Dispense:  90 tablet    Refill:  1  . pravastatin (PRAVACHOL) 20 MG tablet    Sig: Take 1 tablet (20 mg total) by mouth once a week.    Dispense:  30 tablet    Refill:  3    No Follow-up on file.   Gardner Servantes Elayne Guerin, M.D. Primary Care at [facility] [address] [place],   [phone] phone [phone] fax

## 2016-12-16 LAB — COMPREHENSIVE METABOLIC PANEL
ALBUMIN: 4.1 g/dL (ref 3.6–4.8)
ALK PHOS: 70 IU/L (ref 39–117)
ALT: 6 IU/L (ref 0–32)
AST: 15 IU/L (ref 0–40)
Albumin/Globulin Ratio: 1.3 (ref 1.2–2.2)
BUN/Creatinine Ratio: 10 — ABNORMAL LOW (ref 12–28)
BUN: 10 mg/dL (ref 8–27)
Bilirubin Total: <0.2 mg/dL (ref 0.0–1.2)
CALCIUM: 9.6 mg/dL (ref 8.7–10.3)
CO2: 25 mmol/L (ref 20–29)
CREATININE: 1.04 mg/dL — AB (ref 0.57–1.00)
Chloride: 106 mmol/L (ref 96–106)
GFR, EST AFRICAN AMERICAN: 64 mL/min/{1.73_m2} (ref >59)
GFR, EST NON AFRICAN AMERICAN: 56 mL/min/{1.73_m2} — AB (ref >59)
GLOBULIN, TOTAL: 3.1 g/dL (ref 1.5–4.5)
GLUCOSE: 89 mg/dL (ref 65–99)
Potassium: 4.5 mmol/L (ref 3.5–5.2)
Sodium: 140 mmol/L (ref 134–144)
Total Protein: 7.2 g/dL (ref 6.0–8.5)

## 2016-12-16 LAB — CBC WITH DIFFERENTIAL/PLATELET
BASOS ABS: 0 10*3/uL (ref 0.0–0.2)
Basos: 1 %
EOS (ABSOLUTE): 0.1 10*3/uL (ref 0.0–0.4)
EOS: 2 %
HEMATOCRIT: 42.6 % (ref 34.0–46.6)
HEMOGLOBIN: 14.4 g/dL (ref 11.1–15.9)
IMMATURE GRANULOCYTES: 0 %
Immature Grans (Abs): 0 10*3/uL (ref 0.0–0.1)
LYMPHS: 56 %
Lymphocytes Absolute: 2 10*3/uL (ref 0.7–3.1)
MCH: 31.8 pg (ref 26.6–33.0)
MCHC: 33.8 g/dL (ref 31.5–35.7)
MCV: 94 fL (ref 79–97)
MONOCYTES: 6 %
Monocytes Absolute: 0.2 10*3/uL (ref 0.1–0.9)
NEUTROS PCT: 35 %
Neutrophils Absolute: 1.2 10*3/uL — ABNORMAL LOW (ref 1.4–7.0)
Platelets: 189 10*3/uL (ref 150–379)
RBC: 4.53 x10E6/uL (ref 3.77–5.28)
RDW: 13.6 % (ref 12.3–15.4)
WBC: 3.5 10*3/uL (ref 3.4–10.8)

## 2016-12-16 LAB — LIPID PANEL
CHOL/HDL RATIO: 5.7 ratio — AB (ref 0.0–4.4)
Cholesterol, Total: 273 mg/dL — ABNORMAL HIGH (ref 100–199)
HDL: 48 mg/dL (ref >39)
LDL CALC: 187 mg/dL — AB (ref 0–99)
TRIGLYCERIDES: 190 mg/dL — AB (ref 0–149)
VLDL CHOLESTEROL CAL: 38 mg/dL (ref 5–40)

## 2017-01-24 ENCOUNTER — Ambulatory Visit: Payer: Medicare Other | Admitting: Vascular Surgery

## 2017-02-01 ENCOUNTER — Other Ambulatory Visit: Payer: Self-pay

## 2017-02-01 ENCOUNTER — Ambulatory Visit (INDEPENDENT_AMBULATORY_CARE_PROVIDER_SITE_OTHER): Payer: Medicare Other | Admitting: Vascular Surgery

## 2017-02-01 ENCOUNTER — Encounter: Payer: Self-pay | Admitting: Vascular Surgery

## 2017-02-01 VITALS — BP 143/89 | HR 73 | Temp 98.3°F | Resp 16 | Ht 61.5 in | Wt 173.0 lb

## 2017-02-01 DIAGNOSIS — I83891 Varicose veins of right lower extremities with other complications: Secondary | ICD-10-CM | POA: Diagnosis not present

## 2017-02-01 NOTE — Progress Notes (Signed)
Subjective:     Patient ID: [name], female   DOB: 02/17/1950, 67 y.o.   MRN: [id number]  HPI This 66 year old female returns 3 months post-laser ablation right great saphenous vein performed for gross reflux with painful varicosities. She is tried long-leg elastic compression stockings as well as elevation and ibuprofen and continues to have aching discomfort throughout the right thigh and calf. She also has discomfort in the ankle area near the dorsum of the foot where she has some large bulging varicosities.  Past Medical History:  Diagnosis Date  . Allergy    Zyrtec PRN  . Anemia   . Cataract   . Colon polyp 03/11/2007  . Diverticulosis    by colonoscopy 2009  . Fibromyalgia    diagnosed by Dr. Teressa Lower. s/p rheumatology consultation  . GERD (gastroesophageal reflux disease)   . Hyperlipidemia   . Hypertension   . IBS (irritable bowel syndrome)    diarrhea, constipation alternating; s/p GI consult.  . Internal hemorrhoid    colonoscopy in 2009    Social History   Tobacco Use  . Smoking status: Former Smoker    Packs/day: 2.00    Years: 27.00    Pack years: 54.00    Last attempt to quit: 10/25/1994    Years since quitting: 22.2  . Smokeless tobacco: Never Used  Substance Use Topics  . Alcohol use: No    Alcohol/week: 0.0 oz    Family History  Problem Relation Age of Onset  . Hypertension Mother   . Stroke Mother        multiple CVAs/TIAs  . Heart disease Mother 59       CHF  . Cancer Father        prostate cancer  . Hypertension Sister   . Hyperlipidemia Sister   . Glaucoma Sister   . Hypertension Brother   . Prostatitis Brother   . Hypertension Maternal Grandmother   . Hypertension Maternal Grandfather   . Drug abuse Paternal Grandmother   . Hypertension Sister   . Mental illness Sister   . Glaucoma Sister   . Colon cancer Cousin   . Pancreatic cancer Cousin   . Esophageal cancer Neg Hx   . Rectal cancer Neg Hx   . Stomach cancer Neg Hx      Allergies  Allergen Reactions  . Aspirin     My stomach burns and a "little bit of feeling like hard to breath".  . Pollen Extract     Skin itching, runny eyes, sinus pressure  . Sulfa Antibiotics     itching     Current Outpatient Medications:  .  amLODipine (NORVASC) 2.5 MG tablet, Take 1 tablet (2.5 mg total) by mouth daily., Disp: 90 tablet, Rfl: 1 .  dicyclomine (BENTYL) 10 MG capsule, Take 1 capsule (10 mg total) by mouth 3 (three) times daily before meals., Disp: 90 capsule, Rfl: 2 .  hydrocortisone (PROCTOCORT) 1 % CREA, Apply 1 application topically 3 (three) times daily as needed., Disp: 28 g, Rfl: 2 .  pravastatin (PRAVACHOL) 20 MG tablet, Take 1 tablet (20 mg total) by mouth once a week., Disp: 30 tablet, Rfl: 3 .  ranitidine (ZANTAC) 300 MG tablet, Take 1 tablet (300 mg total) by mouth at bedtime., Disp: 30 tablet, Rfl: 11 .  Lidocaine, Anorectal, (RECTICARE) 5 % CREA, Apply rectally twice daily (Patient not taking: Reported on 02/01/2017), Disp: 30 g, Rfl: 5 .  metoprolol succinate (TOPROL XL) 25 MG 24 hr  tablet, Take 1 tablet (25 mg total) by mouth daily. (Patient not taking: Reported on 02/01/2017), Disp: 90 tablet, Rfl: 1 .  rosuvastatin (CRESTOR) 5 MG tablet, Take 1 tablet (5 mg total) by mouth once a week. (Patient not taking: Reported on 02/01/2017), Disp: 30 tablet, Rfl: 1  Vitals:   02/01/17 1536 02/01/17 1538  BP: (!) 158/88 (!) 143/89  Pulse: 77 73  Resp: 16   Temp: 98.3 F (36.8 C)   TempSrc: Oral   SpO2: 100%   Weight: 173 lb (78.5 kg)   Height: 5' 1.5" (1.562 m)     Body mass index is 32.16 kg/m.         Review of Systems Denies chest pain, dyspnea on exertion, PND, orthopnea, hemoptysis, claudication    Objective:   Physical Exam BP (!) 143/89 (BP Location: Left Arm, Patient Position: Sitting, Cuff Size: Normal)   Pulse 73   Temp 98.3 F (36.8 C) (Oral)   Resp 16   Ht 5' 1.5" (1.562 m)   Wt 173 lb (78.5 kg)   SpO2 100%   BMI 32.16  kg/m   Gen. obese female no apparent distress alert and in 3 Lungs no rhonchi or wheezing Right leg with bulging varicosities in the medial thigh distally and extended into the medial calf. Also distal third of the leg onto the dorsum of the foot considerable varicosities. 1+ edema distally. 3 posterior cells pedis pulse palpable.       Assessment:     Status post laser ablation right great saphenous vein with gross reflux for distal swelling and painful varicosities-residual pain and bulging varicosities with successful ablation    Plan:     Patient needs greater than 20 stab phlebectomy of residual painful varicosities in right leg We will schedule this in the near future to hopefully relieve her symptoms

## 2017-03-08 ENCOUNTER — Encounter: Payer: Medicare Other | Admitting: Vascular Surgery

## 2017-03-21 ENCOUNTER — Ambulatory Visit: Payer: Medicare Other | Admitting: Family Medicine

## 2017-04-13 ENCOUNTER — Ambulatory Visit (INDEPENDENT_AMBULATORY_CARE_PROVIDER_SITE_OTHER): Payer: Medicare Other | Admitting: Family Medicine

## 2017-04-13 ENCOUNTER — Encounter: Payer: Self-pay | Admitting: Family Medicine

## 2017-04-13 ENCOUNTER — Other Ambulatory Visit: Payer: Self-pay

## 2017-04-13 VITALS — BP 162/82 | HR 65 | Temp 98.4°F | Ht 61.4 in | Wt 176.0 lb

## 2017-04-13 DIAGNOSIS — N3941 Urge incontinence: Secondary | ICD-10-CM

## 2017-04-13 DIAGNOSIS — I10 Essential (primary) hypertension: Secondary | ICD-10-CM | POA: Diagnosis not present

## 2017-04-13 DIAGNOSIS — K580 Irritable bowel syndrome with diarrhea: Secondary | ICD-10-CM | POA: Diagnosis not present

## 2017-04-13 DIAGNOSIS — E7800 Pure hypercholesterolemia, unspecified: Secondary | ICD-10-CM

## 2017-04-13 DIAGNOSIS — I83891 Varicose veins of right lower extremities with other complications: Secondary | ICD-10-CM

## 2017-04-13 LAB — POCT URINALYSIS DIP (MANUAL ENTRY)
Bilirubin, UA: NEGATIVE
Glucose, UA: NEGATIVE mg/dL
Ketones, POC UA: NEGATIVE mg/dL
Leukocytes, UA: NEGATIVE
Nitrite, UA: NEGATIVE
PH UA: 5 (ref 5.0–8.0)
PROTEIN UA: NEGATIVE mg/dL
SPEC GRAV UA: 1.015 (ref 1.010–1.025)
UROBILINOGEN UA: 0.2 U/dL

## 2017-04-13 MED ORDER — CLONIDINE HCL 0.1 MG PO TABS: 0.1000 mg | ORAL_TABLET | Freq: Two times a day (BID) | ORAL | 5 refills | Status: DC

## 2017-04-13 NOTE — Patient Instructions (Addendum)
   IF you received an x-ray today, you will receive an invoice from [facility]. Please contact [facility] at [phone] with questions or concerns regarding your invoice.   IF you received labwork today, you will receive an invoice from [facility]. Please contact [facility] at [phone] with questions or concerns regarding your invoice.   Our billing staff will not be able to assist you with questions regarding bills from these companies.  You will be contacted with the lab results as soon as they are available. The fastest way to get your results is to activate your My Chart account. Instructions are located on the last page of this paperwork. If you have not heard from us regarding the results in 2 weeks, please contact this office.      Managing Your Hypertension Hypertension is commonly called high blood pressure. This is when the force of your blood pressing against the walls of your arteries is too strong. Arteries are blood vessels that carry blood from your heart throughout your body. Hypertension forces the heart to work harder to pump blood, and may cause the arteries to become narrow or stiff. Having untreated or uncontrolled hypertension can cause heart attack, stroke, kidney disease, and other problems. What are blood pressure readings? A blood pressure reading consists of a higher number over a lower number. Ideally, your blood pressure should be below 120/80. The first ("top") number is called the systolic pressure. It is a measure of the pressure in your arteries as your heart beats. The second ("bottom") number is called the diastolic pressure. It is a measure of the pressure in your arteries as the heart relaxes. What does my blood pressure reading mean? Blood pressure is classified into four stages. Based on your blood pressure reading, your health care provider may use the following stages to determine what type of treatment you need, if any. Systolic  pressure and diastolic pressure are measured in a unit called mm Hg. Normal  Systolic pressure: below 120.  Diastolic pressure: below 80. Elevated  Systolic pressure: 120-129.  Diastolic pressure: below 80. Hypertension stage 1  Systolic pressure: 130-139.  Diastolic pressure: 80-89. Hypertension stage 2  Systolic pressure: 140 or above.  Diastolic pressure: 90 or above. What health risks are associated with hypertension? Managing your hypertension is an important responsibility. Uncontrolled hypertension can lead to:  A heart attack.  A stroke.  A weakened blood vessel (aneurysm).  Heart failure.  Kidney damage.  Eye damage.  Metabolic syndrome.  Memory and concentration problems.  What changes can I make to manage my hypertension? Hypertension can be managed by making lifestyle changes and possibly by taking medicines. Your health care provider will help you make a plan to bring your blood pressure within a normal range. Eating and drinking  Eat a diet that is high in fiber and potassium, and low in salt (sodium), added sugar, and fat. An example eating plan is called the DASH (Dietary Approaches to Stop Hypertension) diet. To eat this way: ? Eat plenty of fresh fruits and vegetables. Try to fill half of your plate at each meal with fruits and vegetables. ? Eat whole grains, such as whole wheat pasta, brown rice, or whole grain bread. Fill about one quarter of your plate with whole grains. ? Eat low-fat diary products. ? Avoid fatty cuts of meat, processed or cured meats, and poultry with skin. Fill about one quarter of your plate with lean proteins such as fish, chicken without skin, beans, eggs,   and tofu. ? Avoid premade and processed foods. These tend to be higher in sodium, added sugar, and fat.  Reduce your daily sodium intake. Most people with hypertension should eat less than 1,500 mg of sodium a day.  Limit alcohol intake to no more than 1 drink a day  for nonpregnant women and 2 drinks a day for men. One drink equals 12 oz of beer, 5 oz of wine, or 1 oz of hard liquor. Lifestyle  Work with your health care provider to maintain a healthy body weight, or to lose weight. Ask what an ideal weight is for you.  Get at least 30 minutes of exercise that causes your heart to beat faster (aerobic exercise) most days of the week. Activities may include walking, swimming, or biking.  Include exercise to strengthen your muscles (resistance exercise), such as weight lifting, as part of your weekly exercise routine. Try to do these types of exercises for 30 minutes at least 3 days a week.  Do not use any products that contain nicotine or tobacco, such as cigarettes and e-cigarettes. If you need help quitting, ask your health care provider.  Control any long-term (chronic) conditions you have, such as high cholesterol or diabetes. Monitoring  Monitor your blood pressure at home as told by your health care provider. Your personal target blood pressure may vary depending on your medical conditions, your age, and other factors.  Have your blood pressure checked regularly, as often as told by your health care provider. Working with your health care provider  Review all the medicines you take with your health care provider because there may be side effects or interactions.  Talk with your health care provider about your diet, exercise habits, and other lifestyle factors that may be contributing to hypertension.  Visit your health care provider regularly. Your health care provider can help you create and adjust your plan for managing hypertension. Will I need medicine to control my blood pressure? Your health care provider may prescribe medicine if lifestyle changes are not enough to get your blood pressure under control, and if:  Your systolic blood pressure is 130 or higher.  Your diastolic blood pressure is 80 or higher.  Take medicines only as told  by your health care provider. Follow the directions carefully. Blood pressure medicines must be taken as prescribed. The medicine does not work as well when you skip doses. Skipping doses also puts you at risk for problems. Contact a health care provider if:  You think you are having a reaction to medicines you have taken.  You have repeated (recurrent) headaches.  You feel dizzy.  You have swelling in your ankles.  You have trouble with your vision. Get help right away if:  You develop a severe headache or confusion.  You have unusual weakness or numbness, or you feel faint.  You have severe pain in your chest or abdomen.  You vomit repeatedly.  You have trouble breathing. Summary  Hypertension is when the force of blood pumping through your arteries is too strong. If this condition is not controlled, it may put you at risk for serious complications.  Your personal target blood pressure may vary depending on your medical conditions, your age, and other factors. For most people, a normal blood pressure is less than 120/80.  Hypertension is managed by lifestyle changes, medicines, or both. Lifestyle changes include weight loss, eating a healthy, low-sodium diet, exercising more, and limiting alcohol. This information is not intended to replace advice   given to you by your health care provider. Make sure you discuss any questions you have with your health care provider. Document Released: 09/22/2011 Document Revised: 11/26/2015 Document Reviewed: 11/26/2015 Elsevier Interactive Patient Education  2018 [employer]vier Inc[employer]  

## 2017-04-13 NOTE — Progress Notes (Signed)
Subjective:    Patient ID: [name], female    DOB: May 13, 1950, 67 y.o.   MRN: [id number]  04/13/2017  Follow-up (Blood pressure management)    HPI This 66 y.o. female presents for four month follow-up of hypertension, hypercholesterolemia.  Management changes made at last visit include the following:  Moderately controlled hypertension.  Patient requesting to discontinue amlodipine due to side effects.  Will discontinue amlodipine.  Prescription for metoprolol 25 mg daily provided. Patient intolerant to Crestor once weekly.  Recommend trial of pravastatin therapy. Has upcoming appointments with urology and gastroenterology for urge incontinence and IBS with diarrhea.  I recommend weight loss, exercise, and low-carbohydrate low-sugar food choices. You should AVOID: regular sodas, sweetened tea, fruit juices.  You should LIMIT: breads, pastas, rice, potatoes, and desserts/sweets.  I would recommend limiting your total carbohydrate intake per meal to 45 grams; I would limit your total carbohydrate intake per snack to 30 grams.  I would also have a goal of 60 grams of protein intake per day; this would equal 10-15 grams of protein per meal and 5-10 grams of protein per snack. Lab results: No evidence of anemia. Review reveals sugar/glucose is normal at 89. Cholesterol is extremely elevated at 273. Recommend trial of pravastatin as discussed at visit.  UPDATE: Blood pressure has been elevated at home.  Has been feeling badly; feeling better today. Also having a headache for a few days. Wheezing a lot; throat irritation.  Onset in past month; irritation in substernal region. Wheezes at night.  Associates with metoprolol.  Not similar to allergy symptoms. Respirate machine.   Not taking any cholesterol medication.  Taking indigestion medication when needed.   BP Readings from Last 3 Encounters:  05/25/17 (!) 152/80  04/13/17 (!) 162/82  02/01/17 (!) 143/89   Wt Readings from  Last 3 Encounters:  05/25/17 173 lb 6.4 oz (78.7 kg)  04/13/17 176 lb (79.8 kg)  02/01/17 173 lb (78.5 kg)   Immunization History  Administered Date(s) Administered  . Influenza,inj,Quad PF,6+ Mos 11/10/2015, 09/14/2016  . Influenza-Unspecified 03/14/2015  . Pneumococcal Conjugate-13 03/14/2015  . Pneumococcal Polysaccharide-23 07/12/2016  . Tdap 12/04/2013    Review of Systems  Constitutional: Negative for chills, diaphoresis, fatigue and fever.  HENT: Positive for postnasal drip, sneezing and sore throat. Negative for congestion, ear pain, rhinorrhea, sinus pressure, sinus pain, trouble swallowing and voice change.   Eyes: Negative for visual disturbance.  Respiratory: Positive for wheezing. Negative for cough and shortness of breath.   Cardiovascular: Negative for chest pain, palpitations and leg swelling.  Gastrointestinal: Negative for abdominal pain, constipation, diarrhea, nausea and vomiting.  Endocrine: Negative for cold intolerance, heat intolerance, polydipsia, polyphagia and polyuria.  Neurological: Positive for headaches. Negative for dizziness, tremors, seizures, syncope, facial asymmetry, speech difficulty, weakness, light-headedness and numbness.    Past Medical History:  Diagnosis Date  . Allergy    Zyrtec PRN  . Anemia   . Cataract   . Colon polyp 03/11/2007  . Diverticulosis    by colonoscopy 2009  . Fibromyalgia    diagnosed by Dr. Teressa Lower. s/p rheumatology consultation  . GERD (gastroesophageal reflux disease)   . Hyperlipidemia   . Hypertension   . IBS (irritable bowel syndrome)    diarrhea, constipation alternating; s/p GI consult.  . Internal hemorrhoid    colonoscopy in 2009   Past Surgical History:  Procedure Laterality Date  . CESAREAN SECTION    . COLONOSCOPY    . ENDOVENOUS ABLATION SAPHENOUS  VEIN W/ LASER Right 08/17/2016   endovenous laser ablation right greater saphenous vein by Tinnie Gens MD   . MYOMECTOMY    . TUBAL LIGATION      . UPPER GASTROINTESTINAL ENDOSCOPY     Allergies  Allergen Reactions  . Aspirin     My stomach burns and a "little bit of feeling like hard to breath".  . Pollen Extract     Skin itching, runny eyes, sinus pressure  . Sulfa Antibiotics     itching   Current Outpatient Medications on File Prior to Visit  Medication Sig Dispense Refill  . dicyclomine (BENTYL) 10 MG capsule Take 1 capsule (10 mg total) by mouth 3 (three) times daily before meals. 90 capsule 2  . hydrocortisone (PROCTOCORT) 1 % CREA Apply 1 application topically 3 (three) times daily as needed. 28 g 2  . Lidocaine, Anorectal, (RECTICARE) 5 % CREA Apply rectally twice daily 30 g 5  . metoprolol succinate (TOPROL XL) 25 MG 24 hr tablet Take 1 tablet (25 mg total) by mouth daily. 90 tablet 1  . ranitidine (ZANTAC) 300 MG tablet Take 1 tablet (300 mg total) by mouth at bedtime. 30 tablet 11  . rosuvastatin (CRESTOR) 5 MG tablet Take 1 tablet (5 mg total) by mouth once a week. 30 tablet 1  . pravastatin (PRAVACHOL) 20 MG tablet Take 1 tablet (20 mg total) by mouth once a week. 30 tablet 3   No current facility-administered medications on file prior to visit.    Social History   Socioeconomic History  . Marital status: Single    Spouse name: Not on file  . Number of children: Not on file  . Years of education: Not on file  . Highest education level: Not on file  Occupational History  . Not on file  Social Needs  . Financial resource strain: Not on file  . Food insecurity:    Worry: Not on file    Inability: Not on file  . Transportation needs:    Medical: Not on file    Non-medical: Not on file  Tobacco Use  . Smoking status: Former Smoker    Packs/day: 2.00    Years: 27.00    Pack years: 54.00    Last attempt to quit: 10/25/1994    Years since quitting: 22.6  . Smokeless tobacco: Never Used  Substance and Sexual Activity  . Alcohol use: No    Alcohol/week: 0.0 oz  . Drug use: No  . Sexual activity: Yes   Lifestyle  . Physical activity:    Days per week: Not on file    Minutes per session: Not on file  . Stress: Not on file  Relationships  . Social connections:    Talks on phone: Not on file    Gets together: Not on file    Attends religious service: Not on file    Active member of club or organization: Not on file    Attends meetings of clubs or organizations: Not on file    Relationship status: Not on file  . Intimate partner violence:    Fear of current or ex partner: Not on file    Emotionally abused: Not on file    Physically abused: Not on file    Forced sexual activity: Not on file  Other Topics Concern  . Not on file  Social History Narrative   Marital status: married x 1969; happily married; no abuse     Children: 3 children; 1  grandchildren (32yo).      Lives: with husband, son, daughter      Employment:  Homemaker      Tobacco: quit smoking in 1997; smoked x 30 years.      Alcohol: none      Drugs: none      Exercise: walking three days per week 2018      Seatbelt: 100%     Guns:  None     ADLs: independent with ADLs; no assistant device      Advance Directives: none; desires FULL CODE; no prolonged measures.   Family History  Problem Relation Age of Onset  . Hypertension Mother   . Stroke Mother        multiple CVAs/TIAs  . Heart disease Mother 39       CHF  . Cancer Father        prostate cancer  . Hypertension Sister   . Hyperlipidemia Sister   . Glaucoma Sister   . Hypertension Brother   . Prostatitis Brother   . Hypertension Maternal Grandmother   . Hypertension Maternal Grandfather   . Drug abuse Paternal Grandmother   . Hypertension Sister   . Mental illness Sister   . Glaucoma Sister   . Colon cancer Cousin   . Pancreatic cancer Cousin   . Esophageal cancer Neg Hx   . Rectal cancer Neg Hx   . Stomach cancer Neg Hx        Objective:    BP (!) 162/82 (BP Location: Left Arm, Patient Position: Sitting, Cuff Size: Normal)   Pulse 65    Temp 98.4 F (36.9 C) (Oral)   Ht 5' 1.4" (1.56 m)   Wt 176 lb (79.8 kg)   SpO2 98%   BMI 32.82 kg/m  Physical Exam  Constitutional: She is oriented to person, place, and time. She appears well-developed and well-nourished. No distress.  HENT:  Head: Normocephalic and atraumatic.  Right Ear: External ear normal.  Left Ear: External ear normal.  Nose: Nose normal.  Mouth/Throat: Oropharynx is clear and moist. No oropharyngeal exudate.  Eyes: Pupils are equal, round, and reactive to light. Conjunctivae and EOM are normal.  Neck: Normal range of motion. Neck supple. Carotid bruit is not present. No thyromegaly present.  Cardiovascular: Normal rate, regular rhythm, normal heart sounds and intact distal pulses. Exam reveals no gallop and no friction rub.  No murmur heard. Pulmonary/Chest: Effort normal and breath sounds normal. She has no wheezes. She has no rales.  Abdominal: Soft. Bowel sounds are normal. She exhibits no distension and no mass. There is no tenderness. There is no rebound and no guarding.  Lymphadenopathy:    She has no cervical adenopathy.  Neurological: She is alert and oriented to person, place, and time. No cranial nerve deficit.  Skin: Skin is warm and dry. No rash noted. She is not diaphoretic. No erythema. No pallor.  Psychiatric: She has a normal mood and affect. Her behavior is normal.   No results found. Depression screen [facility] 2/9 05/25/2017 04/13/2017 12/15/2016 09/14/2016 07/12/2016  Decreased Interest 0 0 0 0 0  Down, Depressed, Hopeless 0 0 0 0 0  PHQ - 2 Score 0 0 0 0 0  Altered sleeping - - - - -  Tired, decreased energy - - - - -  Change in appetite - - - - -  Feeling bad or failure about yourself  - - - - -  Trouble concentrating - - - - -  Moving slowly or fidgety/restless - - - - -  Suicidal thoughts - - - - -  PHQ-9 Score - - - - -   Fall Risk  05/25/2017 04/13/2017 12/15/2016 09/14/2016 07/12/2016  Falls in the past year? No No No No No          Assessment & Plan:   1. Essential hypertension   2. Pure hypercholesterolemia   3. Varicose veins of right lower extremity with complications   4. Irritable bowel syndrome with diarrhea   5. Urge incontinence of urine    Hypertension: Uncontrolled.  Patient intolerant to metoprolol therapy.  Stop metoprolol.  Initiate clonidine 0.1 mg twice daily.  EKG with bradycardia at 54.  Hypercholesterolemia: Uncontrolled.  Patient did not initiate statin therapy.  Varicose veins of right lower extremity: Persistent with lower extremity edema and pain.  Recommend compression stockings.   Orders Placed This Encounter  Procedures  . Lipid panel    Order Specific Question:   Has the patient fasted?    Answer:   No  . CMP14+EGFR    Order Specific Question:   Has the patient fasted?    Answer:   No  . POCT urinalysis dipstick  . EKG 12-Lead   Meds ordered this encounter  Medications  . cloNIDine (CATAPRES) 0.1 MG tablet    Sig: Take 1 tablet (0.1 mg total) by mouth 2 (two) times daily.    Dispense:  60 tablet    Refill:  5    Return in about 6 weeks (around 05/25/2017) for recheck high blood pressure.   Severus Brodzinski Elayne Guerin, M.D. Primary Care at [facility] [place] [address] [place], [place]  [address] [phone] phone [phone] fax

## 2017-04-14 LAB — CMP14+EGFR
ALBUMIN: 4.1 g/dL (ref 3.6–4.8)
ALT: 12 IU/L (ref 0–32)
AST: 15 IU/L (ref 0–40)
Albumin/Globulin Ratio: 1.5 (ref 1.2–2.2)
Alkaline Phosphatase: 70 IU/L (ref 39–117)
BILIRUBIN TOTAL: 0.2 mg/dL (ref 0.0–1.2)
BUN / CREAT RATIO: 15 (ref 12–28)
BUN: 12 mg/dL (ref 8–27)
CALCIUM: 9 mg/dL (ref 8.7–10.3)
CO2: 20 mmol/L (ref 20–29)
CREATININE: 0.82 mg/dL (ref 0.57–1.00)
Chloride: 110 mmol/L — ABNORMAL HIGH (ref 96–106)
GFR, EST AFRICAN AMERICAN: 86 mL/min/{1.73_m2} (ref >59)
GFR, EST NON AFRICAN AMERICAN: 74 mL/min/{1.73_m2} (ref >59)
GLUCOSE: 112 mg/dL — AB (ref 65–99)
Globulin, Total: 2.8 g/dL (ref 1.5–4.5)
Potassium: 4.3 mmol/L (ref 3.5–5.2)
Sodium: 143 mmol/L (ref 134–144)
TOTAL PROTEIN: 6.9 g/dL (ref 6.0–8.5)

## 2017-04-14 LAB — LIPID PANEL
Chol/HDL Ratio: 5.8 ratio — ABNORMAL HIGH (ref 0.0–4.4)
Cholesterol, Total: 260 mg/dL — ABNORMAL HIGH (ref 100–199)
HDL: 45 mg/dL (ref >39)
LDL Calculated: 175 mg/dL — ABNORMAL HIGH (ref 0–99)
Triglycerides: 199 mg/dL — ABNORMAL HIGH (ref 0–149)
VLDL Cholesterol Cal: 40 mg/dL (ref 5–40)

## 2017-05-25 ENCOUNTER — Other Ambulatory Visit: Payer: Self-pay

## 2017-05-25 ENCOUNTER — Ambulatory Visit (INDEPENDENT_AMBULATORY_CARE_PROVIDER_SITE_OTHER): Payer: Medicare Other | Admitting: Family Medicine

## 2017-05-25 ENCOUNTER — Encounter: Payer: Self-pay | Admitting: Family Medicine

## 2017-05-25 ENCOUNTER — Ambulatory Visit: Payer: Medicare Other

## 2017-05-25 VITALS — BP 152/80 | HR 82 | Temp 98.7°F | Resp 18 | Ht 61.4 in | Wt 173.4 lb

## 2017-05-25 DIAGNOSIS — M545 Low back pain: Secondary | ICD-10-CM | POA: Diagnosis not present

## 2017-05-25 DIAGNOSIS — R3129 Other microscopic hematuria: Secondary | ICD-10-CM

## 2017-05-25 DIAGNOSIS — G8929 Other chronic pain: Secondary | ICD-10-CM | POA: Diagnosis not present

## 2017-05-25 DIAGNOSIS — M25551 Pain in right hip: Secondary | ICD-10-CM

## 2017-05-25 DIAGNOSIS — I10 Essential (primary) hypertension: Secondary | ICD-10-CM

## 2017-05-25 DIAGNOSIS — R7302 Impaired glucose tolerance (oral): Secondary | ICD-10-CM

## 2017-05-25 DIAGNOSIS — M85851 Other specified disorders of bone density and structure, right thigh: Secondary | ICD-10-CM

## 2017-05-25 DIAGNOSIS — K5903 Drug induced constipation: Secondary | ICD-10-CM | POA: Diagnosis not present

## 2017-05-25 DIAGNOSIS — E7800 Pure hypercholesterolemia, unspecified: Secondary | ICD-10-CM | POA: Diagnosis not present

## 2017-05-25 LAB — POCT URINALYSIS DIP (MANUAL ENTRY)
Bilirubin, UA: NEGATIVE
Glucose, UA: NEGATIVE mg/dL
Ketones, POC UA: NEGATIVE mg/dL
Leukocytes, UA: NEGATIVE
Nitrite, UA: NEGATIVE
Protein Ur, POC: NEGATIVE mg/dL
Spec Grav, UA: 1.02 (ref 1.010–1.025)
Urobilinogen, UA: 0.2 U/dL
pH, UA: 5.5 (ref 5.0–8.0)

## 2017-05-25 MED ORDER — HYDROCHLOROTHIAZIDE 12.5 MG PO TABS: 12.5000 mg | ORAL_TABLET | Freq: Every day | ORAL | 1 refills | Status: DC

## 2017-05-25 NOTE — Patient Instructions (Addendum)
Recommend Colace daily.   Consider calcium plus vitamin D and magnesium for osteopenia/thinning bones.   IF you received an x-ray today, you will receive an invoice from [facility]. Please contact [facility] at [phone] with questions or concerns regarding your invoice.   IF you received labwork today, you will receive an invoice from [facility]. Please contact [facility] at [phone] with questions or concerns regarding your invoice.   Our billing staff will not be able to assist you with questions regarding bills from these companies.  You will be contacted with the lab results as soon as they are available. The fastest way to get your results is to activate your My Chart account. Instructions are located on the last page of this paperwork. If you have not heard from [place] regarding the results in 2 weeks, please contact this office.     Sciatica Rehab Ask your health care provider which exercises are safe for you. Do exercises exactly as told by your health care provider and adjust them as directed. It is normal to feel mild stretching, pulling, tightness, or discomfort as you do these exercises, but you should stop right away if you feel sudden pain or your pain gets worse.Do not begin these exercises until told by your health care provider. Stretching and range of motion exercises These exercises warm up your muscles and joints and improve the movement and flexibility of your hips and your back. These exercises also help to relieve pain, numbness, and tingling. Exercise A: Sciatic nerve glide 1. Sit in a chair with your head facing down toward your chest. Place your hands behind your back. Let your shoulders slump forward. 2. Slowly straighten one of your knees while you tilt your head back as if you are looking toward the ceiling. Only straighten your leg as far as you can without making your symptoms worse. 3. Hold for __________ seconds. 4. Slowly return to the  starting position. 5. Repeat with your other leg. Repeat __________ times. Complete this exercise __________ times a day. Exercise B: Knee to chest with hip adduction and internal rotation  1. Lie on your back on a firm surface with both legs straight. 2. Bend one of your knees and move it up toward your chest until you feel a gentle stretch in your lower back and buttock. Then, move your knee toward the shoulder that is on the opposite side from your leg. ? Hold your leg in this position by holding onto the front of your knee. 3. Hold for __________ seconds. 4. Slowly return to the starting position. 5. Repeat with your other leg. Repeat __________ times. Complete this exercise __________ times a day. Exercise C: Prone extension on elbows  1. Lie on your abdomen on a firm surface. A bed may be too soft for this exercise. 2. Prop yourself up on your elbows. 3. Use your arms to help lift your chest up until you feel a gentle stretch in your abdomen and your lower back. ? This will place some of your body weight on your elbows. If this is uncomfortable, try stacking pillows under your chest. ? Your hips should stay down, against the surface that you are lying on. Keep your hip and back muscles relaxed. 4. Hold for __________ seconds. 5. Slowly relax your upper body and return to the starting position. Repeat __________ times. Complete this exercise __________ times a day. Strengthening exercises These exercises build strength and endurance in your back. Endurance is the ability to use  your muscles for a long time, even after they get tired. Exercise D: Pelvic tilt 1. Lie on your back on a firm surface. Bend your knees and keep your feet flat. 2. Tense your abdominal muscles. Tip your pelvis up toward the ceiling and flatten your lower back into the floor. ? To help with this exercise, you may place a small towel under your lower back and try to push your back into the towel. 3. Hold for  __________ seconds. 4. Let your muscles relax completely before you repeat this exercise. Repeat __________ times. Complete this exercise __________ times a day. Exercise E: Alternating arm and leg raises  1. Get on your hands and knees on a firm surface. If you are on a hard floor, you may want to use padding to cushion your knees, such as an exercise mat. 2. Line up your arms and legs. Your hands should be below your shoulders, and your knees should be below your hips. 3. Lift your left leg behind you. At the same time, raise your right arm and straighten it in front of you. ? Do not lift your leg higher than your hip. ? Do not lift your arm higher than your shoulder. ? Keep your abdominal and back muscles tight. ? Keep your hips facing the ground. ? Do not arch your back. ? Keep your balance carefully, and do not hold your breath. 4. Hold for __________ seconds. 5. Slowly return to the starting position and repeat with your right leg and your left arm. Repeat __________ times. Complete this exercise __________ times a day. Posture and body mechanics  Body mechanics refers to the movements and positions of your body while you do your daily activities. Posture is part of body mechanics. Good posture and healthy body mechanics can help to relieve stress in your body's tissues and joints. Good posture means that your spine is in its natural S-curve position (your spine is neutral), your shoulders are pulled back slightly, and your head is not tipped forward. The following are general guidelines for applying improved posture and body mechanics to your everyday activities. Standing   When standing, keep your spine neutral and your feet about hip-width apart. Keep a slight bend in your knees. Your ears, shoulders, and hips should line up.  When you do a task in which you stand in one place for a long time, place one foot up on a stable object that is 2-4 inches (5-10 cm) high, such as a  footstool. This helps keep your spine neutral. Sitting   When sitting, keep your spine neutral and keep your feet flat on the floor. Use a footrest, if necessary, and keep your thighs parallel to the floor. Avoid rounding your shoulders, and avoid tilting your head forward.  When working at a desk or a computer, keep your desk at a height where your hands are slightly lower than your elbows. Slide your chair under your desk so you are close enough to maintain good posture.  When working at a computer, place your monitor at a height where you are looking straight ahead and you do not have to tilt your head forward or downward to look at the screen. Resting   When lying down and resting, avoid positions that are most painful for you.  If you have pain with activities such as sitting, bending, stooping, or squatting (flexion-based activities), lie in a position in which your body does not bend very much. For example, avoid curling up  on your side with your arms and knees near your chest (fetal position).  If you have pain with activities such as standing for a long time or reaching with your arms (extension-based activities), lie with your spine in a neutral position and bend your knees slightly. Try the following positions: ? Lying on your side with a pillow between your knees. ? Lying on your back with a pillow under your knees. Lifting   When lifting objects, keep your feet at least shoulder-width apart and tighten your abdominal muscles.  Bend your knees and hips and keep your spine neutral. It is important to lift using the strength of your legs, not your back. Do not lock your knees straight out.  Always ask for help to lift heavy or awkward objects. This information is not intended to replace advice given to you by your health care provider. Make sure you discuss any questions you have with your health care provider. Document Released: 12/28/2004 Document Revised: 09/04/2015 Document  Reviewed: 09/13/2014 Elsevier Interactive Patient Education  [employer].

## 2017-05-25 NOTE — Progress Notes (Signed)
Subjective:    Patient ID: [name], female    DOB: 18-Apr-1950, 67 y.o.   MRN: [id number]  05/25/2017  Hypertension (follow up )    HPI This 67 y.o. female presents for evaluation of HYPERTENSION.  Added Clonidine 0.1mg  bid in April 2019.   Extremely dry mouth; constipation.  Must take a laxative; having bowel movement every 3-4 days.   Restarted sister with Lisinopril; talked with sister; not sure what else to do.   Willing to try diuretic. At home, BP running 120-130s/80s when compliant with Clonidine.  Constipation causes cramping which can be severe.    L hip pain and lower back pain: chronic; has been ignoring hip and lower back pain.  L leg feels weaker.  Smaller.  Now R hip is hurting.  Unable to bend.  Stiff and sore in hip.  Bending over is a problem.      BP Readings from Last 3 Encounters:  07/27/17 (!) 142/82  06/10/17 135/66  05/25/17 (!) 152/80   Wt Readings from Last 3 Encounters:  07/27/17 171 lb 12.8 oz (77.9 kg)  05/25/17 173 lb 6.4 oz (78.7 kg)  04/13/17 176 lb (79.8 kg)   Immunization History  Administered Date(s) Administered  . Influenza,inj,Quad PF,6+ Mos 11/10/2015, 09/14/2016  . Influenza-Unspecified 03/14/2015  . Pneumococcal Conjugate-13 03/14/2015  . Pneumococcal Polysaccharide-23 07/12/2016  . Tdap 12/04/2013    Review of Systems  Constitutional: Negative for activity change, appetite change, chills, diaphoresis, fatigue, fever and unexpected weight change.  HENT: Negative for congestion, dental problem, drooling, ear discharge, ear pain, facial swelling, hearing loss, mouth sores, nosebleeds, postnasal drip, rhinorrhea, sinus pressure, sneezing, sore throat, tinnitus, trouble swallowing and voice change.   Eyes: Negative for photophobia, pain, discharge, redness, itching and visual disturbance.  Respiratory: Negative for apnea, cough, choking, chest tightness, shortness of breath, wheezing and stridor.   Cardiovascular: Negative for  chest pain, palpitations and leg swelling.  Gastrointestinal: Positive for constipation. Negative for abdominal distention, abdominal pain, anal bleeding, blood in stool, diarrhea, nausea, rectal pain and vomiting.  Endocrine: Negative for cold intolerance, heat intolerance, polydipsia, polyphagia and polyuria.  Genitourinary: Negative for decreased urine volume, difficulty urinating, dyspareunia, dysuria, enuresis, flank pain, frequency, genital sores, hematuria, menstrual problem, pelvic pain, urgency, vaginal bleeding, vaginal discharge and vaginal pain.  Musculoskeletal: Positive for arthralgias, back pain and gait problem. Negative for joint swelling, myalgias, neck pain and neck stiffness.  Skin: Negative for color change, pallor, rash and wound.  Allergic/Immunologic: Negative for environmental allergies, food allergies and immunocompromised state.  Neurological: Negative for dizziness, tremors, seizures, syncope, facial asymmetry, speech difficulty, weakness, light-headedness, numbness and headaches.  Hematological: Negative for adenopathy. Does not bruise/bleed easily.  Psychiatric/Behavioral: Negative for agitation, behavioral problems, confusion, decreased concentration, dysphoric mood, hallucinations, self-injury, sleep disturbance and suicidal ideas. The patient is not nervous/anxious and is not hyperactive.     Past Medical History:  Diagnosis Date  . Allergy    Zyrtec PRN  . Anemia   . Cataract   . Colon polyp 03/11/2007  . Diverticulosis    by colonoscopy 2009  . Fibromyalgia    diagnosed by Dr. Teressa Lower. s/p rheumatology consultation  . GERD (gastroesophageal reflux disease)   . Hyperlipidemia   . Hypertension   . IBS (irritable bowel syndrome)    diarrhea, constipation alternating; s/p GI consult.  . Internal hemorrhoid    colonoscopy in 2009   Past Surgical History:  Procedure Laterality Date  . CESAREAN SECTION    .  COLONOSCOPY    . ENDOVENOUS ABLATION  SAPHENOUS VEIN W/ LASER Right 08/17/2016   endovenous laser ablation right greater saphenous vein by Tinnie Gens MD   . MYOMECTOMY    . TUBAL LIGATION    . UPPER GASTROINTESTINAL ENDOSCOPY     Allergies  Allergen Reactions  . Aspirin     My stomach burns and a "little bit of feeling like hard to breath".  . Pollen Extract     Skin itching, runny eyes, sinus pressure  . Sulfa Antibiotics     itching   Current Outpatient Medications on File Prior to Visit  Medication Sig Dispense Refill  . dicyclomine (BENTYL) 10 MG capsule Take 1 capsule (10 mg total) by mouth 3 (three) times daily before meals. (Patient not taking: Reported on 07/27/2017) 90 capsule 2  . hydrocortisone (PROCTOCORT) 1 % CREA Apply 1 application topically 3 (three) times daily as needed. (Patient not taking: Reported on 07/27/2017) 28 g 2  . Lidocaine, Anorectal, (RECTICARE) 5 % CREA Apply rectally twice daily (Patient not taking: Reported on 07/27/2017) 30 g 5  . ranitidine (ZANTAC) 300 MG tablet Take 1 tablet (300 mg total) by mouth at bedtime. 30 tablet 11   No current facility-administered medications on file prior to visit.    Social History   Socioeconomic History  . Marital status: Single    Spouse name: Not on file  . Number of children: Not on file  . Years of education: Not on file  . Highest education level: Not on file  Occupational History  . Not on file  Social Needs  . Financial resource strain: Not on file  . Food insecurity:    Worry: Not on file    Inability: Not on file  . Transportation needs:    Medical: Not on file    Non-medical: Not on file  Tobacco Use  . Smoking status: Former Smoker    Packs/day: 2.00    Years: 27.00    Pack years: 54.00    Last attempt to quit: 10/25/1994    Years since quitting: 22.7  . Smokeless tobacco: Never Used  Substance and Sexual Activity  . Alcohol use: No    Alcohol/week: 0.0 oz  . Drug use: No  . Sexual activity: Yes    Birth  control/protection: Post-menopausal  Lifestyle  . Physical activity:    Days per week: Not on file    Minutes per session: Not on file  . Stress: Not on file  Relationships  . Social connections:    Talks on phone: Not on file    Gets together: Not on file    Attends religious service: Not on file    Active member of club or organization: Not on file    Attends meetings of clubs or organizations: Not on file    Relationship status: Not on file  . Intimate partner violence:    Fear of current or ex partner: Not on file    Emotionally abused: Not on file    Physically abused: Not on file    Forced sexual activity: Not on file  Other Topics Concern  . Not on file  Social History Narrative   Marital status: married x 1969; happily married; no abuse     Children: 3 children; 1 grandchildren (15yo).      Lives: with husband, son, daughter      Employment:  Homemaker      Tobacco: quit smoking in 1997; smoked x 30 years.  Alcohol: none      Drugs: none      Exercise: walking three days per week 2019      Seatbelt: 100%     Guns:  None     ADLs: independent with ADLs; no assistant device      Advance Directives: none; desires FULL CODE; no prolonged measures.   Family History  Problem Relation Age of Onset  . Hypertension Mother   . Stroke Mother        multiple CVAs/TIAs  . Heart disease Mother 75       CHF  . Cancer Father        prostate cancer  . Hypertension Sister   . Hyperlipidemia Sister   . Glaucoma Sister   . Hypertension Brother   . Prostatitis Brother   . Hypertension Maternal Grandmother   . Hypertension Maternal Grandfather   . Drug abuse Paternal Grandmother   . Hypertension Sister   . Mental illness Sister   . Glaucoma Sister   . Colon cancer Cousin   . Pancreatic cancer Cousin   . Esophageal cancer Neg Hx   . Rectal cancer Neg Hx   . Stomach cancer Neg Hx        Objective:    BP (!) 152/80   Pulse 82   Temp 98.7 F (37.1 C) (Oral)    Resp 18   Ht 5' 1.4" (1.56 m)   Wt 173 lb 6.4 oz (78.7 kg)   SpO2 99%   BMI 32.34 kg/m  Physical Exam  Constitutional: She is oriented to person, place, and time. She appears well-developed and well-nourished. No distress.  HENT:  Head: Normocephalic and atraumatic.  Right Ear: External ear normal.  Left Ear: External ear normal.  Nose: Nose normal.  Mouth/Throat: Oropharynx is clear and moist.  Eyes: Pupils are equal, round, and reactive to light. Conjunctivae and EOM are normal.  Neck: Normal range of motion and full passive range of motion without pain. Neck supple. No JVD present. Carotid bruit is not present. No thyromegaly present.  Cardiovascular: Normal rate, regular rhythm and normal heart sounds. Exam reveals no gallop and no friction rub.  No murmur heard. Pulmonary/Chest: Effort normal and breath sounds normal. She has no wheezes. She has no rales.  Abdominal: Soft. Bowel sounds are normal. She exhibits no distension and no mass. There is no tenderness. There is no rebound and no guarding.  Musculoskeletal:       Right shoulder: Normal.       Left shoulder: Normal.       Right hip: She exhibits decreased range of motion. She exhibits normal strength, no tenderness, no bony tenderness, no swelling and no crepitus.       Left hip: She exhibits decreased range of motion. She exhibits normal strength, no tenderness, no bony tenderness and no swelling.       Cervical back: Normal.       Lumbar back: She exhibits decreased range of motion and pain. She exhibits no tenderness, no bony tenderness, no swelling, no spasm and normal pulse.  Lymphadenopathy:    She has no cervical adenopathy.  Neurological: She is alert and oriented to person, place, and time. She has normal reflexes. No cranial nerve deficit. She exhibits normal muscle tone. Coordination normal.  Skin: Skin is warm and dry. No rash noted. She is not diaphoretic. No erythema. No pallor.  Psychiatric: She has a normal  mood and affect. Her behavior is normal. Judgment and  thought content normal.  Nursing note and vitals reviewed.  No results found. Depression screen [facility] 2/9 07/27/2017 05/25/2017 04/13/2017 12/15/2016 09/14/2016  Decreased Interest 0 0 0 0 0  Down, Depressed, Hopeless 0 0 0 0 0  PHQ - 2 Score 0 0 0 0 0  Altered sleeping - - - - -  Tired, decreased energy - - - - -  Change in appetite - - - - -  Feeling bad or failure about yourself  - - - - -  Trouble concentrating - - - - -  Moving slowly or fidgety/restless - - - - -  Suicidal thoughts - - - - -  PHQ-9 Score - - - - -   Fall Risk  07/27/2017 05/25/2017 04/13/2017 12/15/2016 09/14/2016  Falls in the past year? No No No No No        Assessment & Plan:   1. Chronic right-sided low back pain without sciatica   2. Essential hypertension   3. Glucose intolerance (impaired glucose tolerance)   4. Pure hypercholesterolemia   5. Hematuria, microscopic   6. Drug-induced constipation   7. Right hip pain   8. Osteopenia of right hip     Hypertension: Uncontrolled.  Intolerant to clonidine therapy due to constipation side effect.  Patient agreeable to retrial of diuretic therapy.  Prescribed hydrochlorothiazide.  Obtain labs for chronic disease management.  Impaired glucose tolerance: Stable.  Obtain glucose level.  Recommend weight loss, exercise, low carbohydrate and low sugar food choices.  Hypercholesterolemia: Uncontrolled.  Obtain labs.  Patient intolerant to statin therapy due to arthralgias.  Microscopic hematuria: New at last visit.  Repeat today.  If persistent, refer to urology.  Low back pain and right hip pain: New onset.  Obtain lumbar spine films and right hip films.  Home exercise program provided.  Patient has declined physical therapy referral at this time.  If no improvement in 1 month, call for referral to physical therapy and orthopedics.  Osteopenia: New.  Recommend 3 servings of dairy daily or calcium 600 mg twice daily  with vitamin D 800 international unit minutes daily.  Recommend daily weightbearing exercise for 30 minutes.  repeat bone density scan in 2 years.  Last bone density in August 2018.  FRAX score of 5% for major osteoporotic fracture and 0.8% from hip fracture.   Orders Placed This Encounter  Procedures  . DG Lumbar Spine Complete    Standing Status:   Future    Number of Occurrences:   1    Standing Expiration Date:   07/28/2018    Order Specific Question:   Reason for Exam (SYMPTOM  OR DIAGNOSIS REQUIRED)    Answer:   pain    Order Specific Question:   Preferred imaging location?    Answer:   External  . DG HIP UNILAT WITH PELVIS 2-3 VIEWS RIGHT    Standing Status:   Future    Number of Occurrences:   1    Standing Expiration Date:   07/27/2018    Order Specific Question:   Reason for Exam (SYMPTOM  OR DIAGNOSIS REQUIRED)    Answer:   pain    Order Specific Question:   Preferred imaging location?    Answer:   External    Order Specific Question:   Radiology Contrast Protocol - do NOT remove file path    Answer:   [email or web address]  . CBC with Differential/Platelet  . Comprehensive metabolic panel    Order Specific Question:  Has the patient fasted?    Answer:   No  . Lipid panel    Order Specific Question:   Has the patient fasted?    Answer:   No  . Urine Microscopic  . POCT urinalysis dipstick  . EKG 12-Lead   Meds ordered this encounter  Medications  . hydrochlorothiazide (HYDRODIURIL) 12.5 MG tablet    Sig: Take 1 tablet (12.5 mg total) by mouth daily.    Dispense:  90 tablet    Refill:  1    Return in about 2 months (around 07/25/2017) for  complete physical examiniation.   Zaccai Chavarin Elayne Guerin, M.D. Primary Care at [facility] previously [facility] [place] [address] [place], [place]  [address] [phone] phone [phone] fax

## 2017-05-26 ENCOUNTER — Ambulatory Visit (INDEPENDENT_AMBULATORY_CARE_PROVIDER_SITE_OTHER): Payer: Medicare Other

## 2017-05-26 DIAGNOSIS — G8929 Other chronic pain: Secondary | ICD-10-CM | POA: Diagnosis not present

## 2017-05-26 DIAGNOSIS — M25551 Pain in right hip: Secondary | ICD-10-CM

## 2017-05-26 DIAGNOSIS — M545 Low back pain: Secondary | ICD-10-CM | POA: Diagnosis not present

## 2017-05-26 LAB — URINALYSIS, MICROSCOPIC ONLY: Casts: NONE SEEN /lpf

## 2017-05-26 LAB — COMPREHENSIVE METABOLIC PANEL
A/G RATIO: 1.4 (ref 1.2–2.2)
ALK PHOS: 76 IU/L (ref 39–117)
ALT: 8 IU/L (ref 0–32)
AST: 13 IU/L (ref 0–40)
Albumin: 3.9 g/dL (ref 3.6–4.8)
BILIRUBIN TOTAL: 0.3 mg/dL (ref 0.0–1.2)
BUN/Creatinine Ratio: 6 — ABNORMAL LOW (ref 12–28)
BUN: 6 mg/dL — ABNORMAL LOW (ref 8–27)
CALCIUM: 8.8 mg/dL (ref 8.7–10.3)
CO2: 22 mmol/L (ref 20–29)
CREATININE: 0.93 mg/dL (ref 0.57–1.00)
Chloride: 106 mmol/L (ref 96–106)
GFR calc Af Amer: 74 mL/min/{1.73_m2} (ref >59)
GFR, EST NON AFRICAN AMERICAN: 64 mL/min/{1.73_m2} (ref >59)
GLOBULIN, TOTAL: 2.8 g/dL (ref 1.5–4.5)
Glucose: 100 mg/dL — ABNORMAL HIGH (ref 65–99)
Potassium: 4.4 mmol/L (ref 3.5–5.2)
SODIUM: 143 mmol/L (ref 134–144)
Total Protein: 6.7 g/dL (ref 6.0–8.5)

## 2017-05-26 LAB — CBC WITH DIFFERENTIAL/PLATELET
BASOS: 0 %
Basophils Absolute: 0 10*3/uL (ref 0.0–0.2)
EOS (ABSOLUTE): 0 10*3/uL (ref 0.0–0.4)
Eos: 1 %
Hematocrit: 42.6 % (ref 34.0–46.6)
Hemoglobin: 14.4 g/dL (ref 11.1–15.9)
IMMATURE GRANS (ABS): 0 10*3/uL (ref 0.0–0.1)
IMMATURE GRANULOCYTES: 0 %
LYMPHS: 51 %
Lymphocytes Absolute: 1.9 10*3/uL (ref 0.7–3.1)
MCH: 31.3 pg (ref 26.6–33.0)
MCHC: 33.8 g/dL (ref 31.5–35.7)
MCV: 93 fL (ref 79–97)
MONOCYTES: 9 %
Monocytes Absolute: 0.3 10*3/uL (ref 0.1–0.9)
NEUTROS ABS: 1.4 10*3/uL (ref 1.4–7.0)
Neutrophils: 39 %
PLATELETS: 153 10*3/uL (ref 150–379)
RBC: 4.6 x10E6/uL (ref 3.77–5.28)
RDW: 14.5 % (ref 12.3–15.4)
WBC: 3.7 10*3/uL (ref 3.4–10.8)

## 2017-05-26 LAB — LIPID PANEL
Chol/HDL Ratio: 6.1 ratio — ABNORMAL HIGH (ref 0.0–4.4)
Cholesterol, Total: 255 mg/dL — ABNORMAL HIGH (ref 100–199)
HDL: 42 mg/dL (ref >39)
LDL CALC: 182 mg/dL — AB (ref 0–99)
Triglycerides: 153 mg/dL — ABNORMAL HIGH (ref 0–149)
VLDL Cholesterol Cal: 31 mg/dL (ref 5–40)

## 2017-06-07 ENCOUNTER — Encounter: Payer: Self-pay | Admitting: Family Medicine

## 2017-06-09 ENCOUNTER — Emergency Department (HOSPITAL_COMMUNITY)
Admission: EM | Admit: 2017-06-09 | Discharge: 2017-06-10 | Disposition: A | Discharge status: Home / Self Care | Payer: Medicare Other | Attending: Emergency Medicine | Admitting: Emergency Medicine

## 2017-06-09 ENCOUNTER — Encounter (HOSPITAL_COMMUNITY): Payer: Self-pay | Admitting: Emergency Medicine

## 2017-06-09 ENCOUNTER — Other Ambulatory Visit: Payer: Self-pay

## 2017-06-09 DIAGNOSIS — R202 Paresthesia of skin: Secondary | ICD-10-CM | POA: Insufficient documentation

## 2017-06-09 DIAGNOSIS — Z87891 Personal history of nicotine dependence: Secondary | ICD-10-CM | POA: Insufficient documentation

## 2017-06-09 DIAGNOSIS — R1111 Vomiting without nausea: Secondary | ICD-10-CM | POA: Diagnosis not present

## 2017-06-09 DIAGNOSIS — R1110 Vomiting, unspecified: Secondary | ICD-10-CM | POA: Diagnosis not present

## 2017-06-09 DIAGNOSIS — E785 Hyperlipidemia, unspecified: Secondary | ICD-10-CM | POA: Diagnosis not present

## 2017-06-09 DIAGNOSIS — Z79899 Other long term (current) drug therapy: Secondary | ICD-10-CM | POA: Insufficient documentation

## 2017-06-09 DIAGNOSIS — I10 Essential (primary) hypertension: Secondary | ICD-10-CM | POA: Insufficient documentation

## 2017-06-09 DIAGNOSIS — E876 Hypokalemia: Secondary | ICD-10-CM

## 2017-06-09 DIAGNOSIS — R1084 Generalized abdominal pain: Secondary | ICD-10-CM | POA: Diagnosis not present

## 2017-06-09 NOTE — ED Triage Notes (Signed)
Pt reports she is having a IBS inflammation, while on the toliet tonight (8pm) she began to experience hand tingling, which has happened before however tonight she also began to experience muscle spasms.  She reports abdominal discomfort. Negative NIH score.

## 2017-06-10 ENCOUNTER — Emergency Department (HOSPITAL_COMMUNITY): Payer: Medicare Other

## 2017-06-10 DIAGNOSIS — R1111 Vomiting without nausea: Secondary | ICD-10-CM | POA: Diagnosis not present

## 2017-06-10 DIAGNOSIS — R1110 Vomiting, unspecified: Secondary | ICD-10-CM | POA: Diagnosis not present

## 2017-06-10 LAB — COMPREHENSIVE METABOLIC PANEL
ALBUMIN: 3.8 g/dL (ref 3.5–5.0)
ALK PHOS: 65 U/L (ref 38–126)
ALT: 10 U/L — AB (ref 14–54)
AST: 17 U/L (ref 15–41)
Anion gap: 12 (ref 5–15)
BUN: 7 mg/dL (ref 6–20)
CALCIUM: 9.1 mg/dL (ref 8.9–10.3)
CHLORIDE: 101 mmol/L (ref 101–111)
CO2: 27 mmol/L (ref 22–32)
CREATININE: 1.09 mg/dL — AB (ref 0.44–1.00)
GFR calc Af Amer: 59 mL/min — ABNORMAL LOW (ref >60)
GFR calc non Af Amer: 51 mL/min — ABNORMAL LOW (ref >60)
GLUCOSE: 133 mg/dL — AB (ref 65–99)
Potassium: 2.9 mmol/L — ABNORMAL LOW (ref 3.5–5.1)
SODIUM: 140 mmol/L (ref 135–145)
Total Bilirubin: 0.8 mg/dL (ref 0.3–1.2)
Total Protein: 7.3 g/dL (ref 6.5–8.1)

## 2017-06-10 LAB — URINALYSIS, ROUTINE W REFLEX MICROSCOPIC
BILIRUBIN URINE: NEGATIVE
Bacteria, UA: NONE SEEN
Glucose, UA: NEGATIVE mg/dL
KETONES UR: NEGATIVE mg/dL
Leukocytes, UA: NEGATIVE
Nitrite: NEGATIVE
Protein, ur: NEGATIVE mg/dL
SPECIFIC GRAVITY, URINE: 1.016 (ref 1.005–1.030)
pH: 5 (ref 5.0–8.0)

## 2017-06-10 LAB — CBC
HCT: 44 % (ref 36.0–46.0)
Hemoglobin: 14.7 g/dL (ref 12.0–15.0)
MCH: 30.8 pg (ref 26.0–34.0)
MCHC: 33.4 g/dL (ref 30.0–36.0)
MCV: 92.1 fL (ref 78.0–100.0)
PLATELETS: 188 10*3/uL (ref 150–400)
RBC: 4.78 MIL/uL (ref 3.87–5.11)
RDW: 12.1 % (ref 11.5–15.5)
WBC: 6.3 10*3/uL (ref 4.0–10.5)

## 2017-06-10 LAB — LIPASE, BLOOD: LIPASE: 20 U/L (ref 11–51)

## 2017-06-10 MED ORDER — SODIUM CHLORIDE 0.9 % IV BOLUS
1000.0000 mL | Freq: Once | INTRAVENOUS | Status: AC
  Administered 2017-06-10: 1000 mL via INTRAVENOUS

## 2017-06-10 MED ORDER — IOHEXOL 300 MG/ML  SOLN
100.0000 mL | Freq: Once | INTRAMUSCULAR | Status: AC
  Administered 2017-06-10: 100 mL via INTRAVENOUS

## 2017-06-10 MED ORDER — POTASSIUM CHLORIDE CRYS ER 20 MEQ PO TBCR
40.0000 meq | EXTENDED_RELEASE_TABLET | Freq: Once | ORAL | Status: AC
  Administered 2017-06-10: 40 meq via ORAL
  Filled 2017-06-10: qty 2

## 2017-06-10 MED ORDER — PANTOPRAZOLE SODIUM 20 MG PO TBEC
20.0000 mg | DELAYED_RELEASE_TABLET | Freq: Once | ORAL | Status: AC
  Administered 2017-06-10: 20 mg via ORAL
  Filled 2017-06-10: qty 1

## 2017-06-10 NOTE — Discharge Instructions (Addendum)
Return here as needed.  Follow-up with your primary doctor as soon as possible I would suggest eating 2-3 bananas a day and having your potassium rechecked in 1 week.

## 2017-06-10 NOTE — ED Notes (Signed)
Reattempted IV placement. Unsuccessful. Notified Irena Cords, PA-C and Johnney Killian, MD. Awaiting further orders.

## 2017-06-28 NOTE — ED Provider Notes (Signed)
[facility] EMERGENCY DEPARTMENT Provider Note   CSN: [id number] Arrival date & time: 06/09/17  2317     History   Chief Complaint Chief Complaint  Patient presents with  . Tingling  . Irritable Bowel Syndrome  . Spasms    HPI [name] is a 67 y.o. female.  HPI Patient presents to the emergency department with tingling in her hands bilaterally.  Patient states she feels like she is having inflammation in her abdomen from her IBS.  Patient states that she is having abdominal discomfort.  Patient did not take any medications prior to arrival.  Patient started to experience the tingling while she was on the toilet using the bathroom.  The patient denies chest pain, shortness of breath, headache,blurred vision, neck pain, fever, cough, weakness, numbness, dizziness, anorexia, edema,  nausea, vomiting, diarrhea, rash, back pain, dysuria, hematemesis, bloody stool, near syncope, or syncope. Past Medical History:  Diagnosis Date  . Allergy    Zyrtec PRN  . Anemia   . Cataract   . Colon polyp 03/11/2007  . Diverticulosis    by colonoscopy 2009  . Fibromyalgia    diagnosed by Dr. Teressa Lower. s/p rheumatology consultation  . GERD (gastroesophageal reflux disease)   . Hyperlipidemia   . Hypertension   . IBS (irritable bowel syndrome)    diarrhea, constipation alternating; s/p GI consult.  . Internal hemorrhoid    colonoscopy in 2009    Patient Active Problem List   Diagnosis Date Noted  . Irritable bowel syndrome with diarrhea 09/18/2016  . Urge incontinence of urine 09/18/2016  . Inflamed external hemorrhoid 07/24/2016  . Varicose veins of right lower extremity with complications 02/72/5366  . Essential hypertension 08/10/2015  . Pure hypercholesterolemia 08/10/2015  . Glucose intolerance (impaired glucose tolerance) 08/10/2015    Past Surgical History:  Procedure Laterality Date  . CESAREAN SECTION    . COLONOSCOPY    . ENDOVENOUS ABLATION  SAPHENOUS VEIN W/ LASER Right 08/17/2016   endovenous laser ablation right greater saphenous vein by Tinnie Gens MD   . MYOMECTOMY    . TUBAL LIGATION    . UPPER GASTROINTESTINAL ENDOSCOPY       OB History   None      Home Medications    Prior to Admission medications   Medication Sig Start Date End Date Taking? Authorizing Provider  cloNIDine (CATAPRES) 0.1 MG tablet Take 1 tablet (0.1 mg total) by mouth 2 (two) times daily. Patient taking differently: Take 0.1 mg by mouth daily.  04/13/17  Yes Wardell Honour, MD  hydrochlorothiazide (HYDRODIURIL) 12.5 MG tablet Take 1 tablet (12.5 mg total) by mouth daily. 05/25/17  Yes Wardell Honour, MD  dicyclomine (BENTYL) 10 MG capsule Take 1 capsule (10 mg total) by mouth 3 (three) times daily before meals. Patient not taking: Reported on 06/10/2017 10/01/16   Ladene Artist, MD  hydrocortisone (PROCTOCORT) 1 % CREA Apply 1 application topically 3 (three) times daily as needed. Patient not taking: Reported on 06/10/2017 07/19/16   Wardell Honour, MD  Lidocaine, Anorectal, (RECTICARE) 5 % CREA Apply rectally twice daily Patient not taking: Reported on 06/10/2017 09/28/16   Ladene Artist, MD  metoprolol succinate (TOPROL XL) 25 MG 24 hr tablet Take 1 tablet (25 mg total) by mouth daily. Patient not taking: Reported on 06/10/2017 12/15/16   Wardell Honour, MD  pravastatin (PRAVACHOL) 20 MG tablet Take 1 tablet (20 mg total) by mouth once a week. Patient  not taking: Reported on 06/10/2017 12/15/16   Wardell Honour, MD  ranitidine (ZANTAC) 300 MG tablet Take 1 tablet (300 mg total) by mouth at bedtime. Patient not taking: Reported on 06/10/2017 09/28/16   Ladene Artist, MD  rosuvastatin (CRESTOR) 5 MG tablet Take 1 tablet (5 mg total) by mouth once a week. Patient not taking: Reported on 06/10/2017 09/14/16   Wardell Honour, MD    Family History Family History  Problem Relation Age of Onset  . Hypertension Mother   . Stroke Mother         multiple CVAs/TIAs  . Heart disease Mother 56       CHF  . Cancer Father        prostate cancer  . Hypertension Sister   . Hyperlipidemia Sister   . Glaucoma Sister   . Hypertension Brother   . Prostatitis Brother   . Hypertension Maternal Grandmother   . Hypertension Maternal Grandfather   . Drug abuse Paternal Grandmother   . Hypertension Sister   . Mental illness Sister   . Glaucoma Sister   . Colon cancer Cousin   . Pancreatic cancer Cousin   . Esophageal cancer Neg Hx   . Rectal cancer Neg Hx   . Stomach cancer Neg Hx     Social History Social History   Tobacco Use  . Smoking status: Former Smoker    Packs/day: 2.00    Years: 27.00    Pack years: 54.00    Last attempt to quit: 10/25/1994    Years since quitting: 22.6  . Smokeless tobacco: Never Used  Substance Use Topics  . Alcohol use: No    Alcohol/week: 0.0 oz  . Drug use: No     Allergies   Aspirin; Pollen extract; and Sulfa antibiotics   Review of Systems Review of Systems All other systems negative except as documented in the HPI. All pertinent positives and negatives as reviewed in the HPI.  Physical Exam Updated Vital Signs BP 135/66   Pulse 66   Temp 98.5 F (36.9 C) (Oral)   Resp 16   SpO2 98%   Physical Exam  Constitutional: She is oriented to person, place, and time. She appears well-developed and well-nourished. No distress.  HENT:  Head: Normocephalic and atraumatic.  Mouth/Throat: Oropharynx is clear and moist.  Eyes: Pupils are equal, round, and reactive to light.  Neck: Normal range of motion. Neck supple.  Cardiovascular: Normal rate, regular rhythm and normal heart sounds. Exam reveals no gallop and no friction rub.  No murmur heard. Pulmonary/Chest: Effort normal and breath sounds normal. No respiratory distress. She has no wheezes.  Abdominal: Soft. Bowel sounds are normal. She exhibits no distension and no mass. There is generalized tenderness. There is no rebound and no  guarding.  Neurological: She is alert and oriented to person, place, and time. She exhibits normal muscle tone. Coordination normal.  Skin: Skin is warm and dry. Capillary refill takes less than 2 seconds. No rash noted. No erythema.  Psychiatric: She has a normal mood and affect. Her behavior is normal.  Nursing note and vitals reviewed.    ED Treatments / Results  Labs (all labs ordered are listed, but only abnormal results are displayed) Labs Reviewed  COMPREHENSIVE METABOLIC PANEL - Abnormal; Notable for the following components:      Result Value   Potassium 2.9 (*)    Glucose, Bld 133 (*)    Creatinine, Ser 1.09 (*)    ALT  10 (*)    GFR calc non Af Amer 51 (*)    GFR calc Af Amer 59 (*)    All other components within normal limits  URINALYSIS, ROUTINE W REFLEX MICROSCOPIC - Abnormal; Notable for the following components:   APPearance HAZY (*)    Hgb urine dipstick MODERATE (*)    All other components within normal limits  LIPASE, BLOOD  CBC    EKG None  Radiology No results found.  Procedures Procedures (including critical care time)  Medications Ordered in ED Medications  sodium chloride 0.9 % bolus 1,000 mL (0 mLs Intravenous Stopped 06/10/17 0903)  iohexol (OMNIPAQUE) 300 MG/ML solution 100 mL (100 mLs Intravenous Contrast Given 06/10/17 0822)  potassium chloride SA (K-DUR,KLOR-CON) CR tablet 40 mEq (40 mEq Oral Given 06/10/17 1000)  pantoprazole (PROTONIX) EC tablet 20 mg (20 mg Oral Given 06/10/17 1030)     Initial Impression / Assessment and Plan / ED Course  I have reviewed the triage vital signs and the nursing notes.  Pertinent labs & imaging results that were available during my care of the patient were reviewed by me and considered in my medical decision making (see chart for details).     Patient had negative CT scan imaging of her abdomen and pelvis.  The patient's laboratory testing did not show any significant abnormalities.  The patient's vital  signs remained normal. I did advise the patient to return for any worsening in her condition.  Patient agrees the plan and all questions were answered.  I did advise her to follow-up with her primary doctor as well.  Final Clinical Impressions(s) / ED Diagnoses   Final diagnoses:  Hypokalemia  Tingling  Generalized abdominal pain    ED Discharge Orders    None       Rebeca Allegra 06/28/17 2337    Charlesetta Shanks, MD 06/29/17 1209

## 2017-07-27 ENCOUNTER — Other Ambulatory Visit: Payer: Self-pay

## 2017-07-27 ENCOUNTER — Encounter: Payer: Self-pay | Admitting: Family Medicine

## 2017-07-27 ENCOUNTER — Ambulatory Visit (INDEPENDENT_AMBULATORY_CARE_PROVIDER_SITE_OTHER): Payer: Medicare Other | Admitting: Family Medicine

## 2017-07-27 VITALS — BP 142/82 | HR 78 | Temp 98.8°F | Resp 16 | Ht 62.6 in | Wt 171.8 lb

## 2017-07-27 DIAGNOSIS — K580 Irritable bowel syndrome with diarrhea: Secondary | ICD-10-CM | POA: Diagnosis not present

## 2017-07-27 DIAGNOSIS — I10 Essential (primary) hypertension: Secondary | ICD-10-CM

## 2017-07-27 DIAGNOSIS — E7800 Pure hypercholesterolemia, unspecified: Secondary | ICD-10-CM | POA: Diagnosis not present

## 2017-07-27 DIAGNOSIS — E6609 Other obesity due to excess calories: Secondary | ICD-10-CM

## 2017-07-27 DIAGNOSIS — N3941 Urge incontinence: Secondary | ICD-10-CM

## 2017-07-27 DIAGNOSIS — I83891 Varicose veins of right lower extremities with other complications: Secondary | ICD-10-CM

## 2017-07-27 DIAGNOSIS — Z0000 Encounter for general adult medical examination without abnormal findings: Secondary | ICD-10-CM | POA: Diagnosis not present

## 2017-07-27 DIAGNOSIS — R739 Hyperglycemia, unspecified: Secondary | ICD-10-CM | POA: Diagnosis not present

## 2017-07-27 DIAGNOSIS — Z6830 Body mass index (BMI) 30.0-30.9, adult: Secondary | ICD-10-CM

## 2017-07-27 DIAGNOSIS — K219 Gastro-esophageal reflux disease without esophagitis: Secondary | ICD-10-CM

## 2017-07-27 LAB — POCT URINALYSIS DIP (MANUAL ENTRY)
BILIRUBIN UA: NEGATIVE mg/dL
Bilirubin, UA: NEGATIVE
Glucose, UA: NEGATIVE mg/dL
LEUKOCYTES UA: NEGATIVE
Nitrite, UA: NEGATIVE
PROTEIN UA: NEGATIVE mg/dL
Spec Grav, UA: 1.015 (ref 1.010–1.025)
Urobilinogen, UA: 0.2 E.U./dL
pH, UA: 5 (ref 5.0–8.0)

## 2017-07-27 MED ORDER — POTASSIUM CHLORIDE CRYS ER 20 MEQ PO TBCR: 20.0000 meq | EXTENDED_RELEASE_TABLET | Freq: Every day | ORAL | 3 refills | Status: DC

## 2017-07-27 NOTE — Progress Notes (Signed)
Subjective:    Patient ID: [name], female    DOB: 1951-01-07, 67 y.o.   MRN: [id number]  07/27/2017  Medicare Wellness    HPI This 67 y.o. female presents for annual wellness exam, comprehensive physical examination, and follow-up evaluation of hypertension, constipation, osteopenia, and HYPOKALEMIA warranting ED visit.  Management changes made at last visit include the following: Recommend Colace daily.   Consider calcium plus vitamin D and magnesium for osteopenia/thinning bones.  Presented to ED due to tingling in bilateral hands and abdominal pain.  Moderate amount of blood found in urine; potassium level 2.9.  Abdominal CT negative for acute process.    Last physical:   07-12-3016 Pap smear:  2015 wnl hpv negative. Mammogram: 06-2016 Colonoscopy:  2017 Bone density:  08-2016 Eye exam:  Glasses; due this year Dental exam:  Overdue.    Visual Acuity Screening   Right eye Left eye Both eyes  Without correction:     With correction: 20/25 20/25 20/20     BP Readings from Last 3 Encounters:  07/27/17 (!) 142/82  06/10/17 135/66  05/25/17 (!) 152/80   Wt Readings from Last 3 Encounters:  07/27/17 171 lb 12.8 oz (77.9 kg)  05/25/17 173 lb 6.4 oz (78.7 kg)  04/13/17 176 lb (79.8 kg)   Immunization History  Administered Date(s) Administered  . Influenza,inj,Quad PF,6+ Mos 11/10/2015, 09/14/2016  . Influenza-Unspecified 03/14/2015  . Pneumococcal Conjugate-13 03/14/2015  . Pneumococcal Polysaccharide-23 07/12/2016  . Tdap 12/04/2013   Health Maintenance  Topic Date Due  . INFLUENZA VACCINE  08/11/2017  . MAMMOGRAM  07/07/2018  . TETANUS/TDAP  12/05/2023  . COLONOSCOPY  05/14/2025  . DEXA SCAN  Completed  . Hepatitis C Screening  Completed  . PNA vac Low Risk Adult  Completed   HTN: Patient reports good compliance with medication, good tolerance to medication, and good symptom control.   Lost clonidine; has not taken.  Not taking any medications at all  right now.  BP goes really high and then will decrease.  Highest readings 180/94.  Hypercholesterolemia: not taking statin therapy at all.    IBS: taking Zantac.    Review of Systems  Constitutional: Negative for activity change, appetite change, chills, diaphoresis, fatigue, fever and unexpected weight change.  HENT: Negative for congestion, dental problem, drooling, ear discharge, ear pain, facial swelling, hearing loss, mouth sores, nosebleeds, postnasal drip, rhinorrhea, sinus pressure, sneezing, sore throat, tinnitus, trouble swallowing and voice change.   Eyes: Negative for photophobia, pain, discharge, redness, itching and visual disturbance.  Respiratory: Negative for apnea, cough, choking, chest tightness, shortness of breath, wheezing and stridor.   Cardiovascular: Negative for chest pain, palpitations and leg swelling.  Gastrointestinal: Positive for constipation and diarrhea. Negative for abdominal distention, abdominal pain, anal bleeding, blood in stool, nausea, rectal pain and vomiting.  Endocrine: Negative for cold intolerance, heat intolerance, polydipsia, polyphagia and polyuria.  Genitourinary: Negative for decreased urine volume, difficulty urinating, dyspareunia, dysuria, enuresis, flank pain, frequency, genital sores, hematuria, menstrual problem, pelvic pain, urgency, vaginal bleeding, vaginal discharge and vaginal pain.       Nocturia x 0-1.  Wakes up 1100.  Musculoskeletal: Positive for arthralgias. Negative for back pain, gait problem, joint swelling, myalgias, neck pain and neck stiffness.  Skin: Negative for color change, pallor, rash and wound.  Allergic/Immunologic: Negative for environmental allergies, food allergies and immunocompromised state.  Neurological: Positive for headaches. Negative for dizziness, tremors, seizures, syncope, facial asymmetry, speech difficulty, weakness, light-headedness and numbness.  Hematological:  Negative for adenopathy. Does not  bruise/bleed easily.  Psychiatric/Behavioral: Negative for agitation, behavioral problems, confusion, decreased concentration, dysphoric mood, hallucinations, self-injury, sleep disturbance and suicidal ideas. The patient is not nervous/anxious and is not hyperactive.        Bedtime 4-5; wakes up 1100.    Past Medical History:  Diagnosis Date  . Allergy    Zyrtec PRN  . Anemia   . Cataract   . Colon polyp 03/11/2007  . Diverticulosis    by colonoscopy 2009  . Fibromyalgia    diagnosed by Dr. Teressa Lower. s/p rheumatology consultation  . GERD (gastroesophageal reflux disease)   . Hyperlipidemia   . Hypertension   . IBS (irritable bowel syndrome)    diarrhea, constipation alternating; s/p GI consult.  . Internal hemorrhoid    colonoscopy in 2009   Past Surgical History:  Procedure Laterality Date  . CESAREAN SECTION    . COLONOSCOPY    . ENDOVENOUS ABLATION SAPHENOUS VEIN W/ LASER Right 08/17/2016   endovenous laser ablation right greater saphenous vein by Tinnie Gens MD   . MYOMECTOMY    . TUBAL LIGATION    . UPPER GASTROINTESTINAL ENDOSCOPY     Allergies  Allergen Reactions  . Aspirin     My stomach burns and a "little bit of feeling like hard to breath".  . Pollen Extract     Skin itching, runny eyes, sinus pressure  . Sulfa Antibiotics     itching   Current Outpatient Medications on File Prior to Visit  Medication Sig Dispense Refill  . ranitidine (ZANTAC) 300 MG tablet Take 1 tablet (300 mg total) by mouth at bedtime. 30 tablet 11  . cloNIDine (CATAPRES) 0.1 MG tablet Take 1 tablet (0.1 mg total) by mouth 2 (two) times daily. (Patient not taking: Reported on 07/27/2017) 60 tablet 5  . dicyclomine (BENTYL) 10 MG capsule Take 1 capsule (10 mg total) by mouth 3 (three) times daily before meals. (Patient not taking: Reported on 07/27/2017) 90 capsule 2  . hydrochlorothiazide (HYDRODIURIL) 12.5 MG tablet Take 1 tablet (12.5 mg total) by mouth daily. (Patient not taking:  Reported on 07/27/2017) 90 tablet 1  . hydrocortisone (PROCTOCORT) 1 % CREA Apply 1 application topically 3 (three) times daily as needed. (Patient not taking: Reported on 07/27/2017) 28 g 2  . Lidocaine, Anorectal, (RECTICARE) 5 % CREA Apply rectally twice daily (Patient not taking: Reported on 07/27/2017) 30 g 5  . metoprolol succinate (TOPROL XL) 25 MG 24 hr tablet Take 1 tablet (25 mg total) by mouth daily. (Patient not taking: Reported on 07/27/2017) 90 tablet 1  . pravastatin (PRAVACHOL) 20 MG tablet Take 1 tablet (20 mg total) by mouth once a week. (Patient not taking: Reported on 07/27/2017) 30 tablet 3  . rosuvastatin (CRESTOR) 5 MG tablet Take 1 tablet (5 mg total) by mouth once a week. (Patient not taking: Reported on 07/27/2017) 30 tablet 1   No current facility-administered medications on file prior to visit.    Social History   Socioeconomic History  . Marital status: Single    Spouse name: Not on file  . Number of children: Not on file  . Years of education: Not on file  . Highest education level: Not on file  Occupational History  . Not on file  Social Needs  . Financial resource strain: Not on file  . Food insecurity:    Worry: Not on file    Inability: Not on file  . Transportation needs:  Medical: Not on file    Non-medical: Not on file  Tobacco Use  . Smoking status: Former Smoker    Packs/day: 2.00    Years: 27.00    Pack years: 54.00    Last attempt to quit: 10/25/1994    Years since quitting: 22.7  . Smokeless tobacco: Never Used  Substance and Sexual Activity  . Alcohol use: No    Alcohol/week: 0.0 oz  . Drug use: No  . Sexual activity: Yes    Birth control/protection: Post-menopausal  Lifestyle  . Physical activity:    Days per week: Not on file    Minutes per session: Not on file  . Stress: Not on file  Relationships  . Social connections:    Talks on phone: Not on file    Gets together: Not on file    Attends religious service: Not on file     Active member of club or organization: Not on file    Attends meetings of clubs or organizations: Not on file    Relationship status: Not on file  . Intimate partner violence:    Fear of current or ex partner: Not on file    Emotionally abused: Not on file    Physically abused: Not on file    Forced sexual activity: Not on file  Other Topics Concern  . Not on file  Social History Narrative   Marital status: married x 1969; happily married; no abuse     Children: 3 children; 1 grandchildren (44yo).      Lives: with husband, son, daughter      Employment:  Homemaker      Tobacco: quit smoking in 1997; smoked x 30 years.      Alcohol: none      Drugs: none      Exercise: walking three days per week 2019      Seatbelt: 100%     Guns:  None     ADLs: independent with ADLs; no assistant device      Advance Directives: none; desires FULL CODE; no prolonged measures.   Family History  Problem Relation Age of Onset  . Hypertension Mother   . Stroke Mother        multiple CVAs/TIAs  . Heart disease Mother 34       CHF  . Cancer Father        prostate cancer  . Hypertension Sister   . Hyperlipidemia Sister   . Glaucoma Sister   . Hypertension Brother   . Prostatitis Brother   . Hypertension Maternal Grandmother   . Hypertension Maternal Grandfather   . Drug abuse Paternal Grandmother   . Hypertension Sister   . Mental illness Sister   . Glaucoma Sister   . Colon cancer Cousin   . Pancreatic cancer Cousin   . Esophageal cancer Neg Hx   . Rectal cancer Neg Hx   . Stomach cancer Neg Hx        Objective:    BP (!) 142/82   Pulse 78   Temp 98.8 F (37.1 C) (Oral)   Resp 16   Ht 5' 2.6" (1.59 m)   Wt 171 lb 12.8 oz (77.9 kg)   SpO2 98%   BMI 30.83 kg/m  Physical Exam  Constitutional: She is oriented to person, place, and time. She appears well-developed and well-nourished. No distress.  HENT:  Head: Normocephalic and atraumatic.  Right Ear: External ear normal.    Left Ear: External ear normal.  Nose: Nose normal.  Mouth/Throat: Oropharynx is clear and moist.  Eyes: Pupils are equal, round, and reactive to light. Conjunctivae and EOM are normal.  Neck: Normal range of motion and full passive range of motion without pain. Neck supple. No JVD present. Carotid bruit is not present. No thyromegaly present.  Cardiovascular: Normal rate, regular rhythm, normal heart sounds and intact distal pulses. Exam reveals no gallop and no friction rub.  No murmur heard. Pulmonary/Chest: Effort normal and breath sounds normal. She has no wheezes. She has no rales. Right breast exhibits no inverted nipple, no mass, no nipple discharge, no skin change and no tenderness. Left breast exhibits no inverted nipple, no mass, no nipple discharge, no skin change and no tenderness. No breast swelling, tenderness, discharge or bleeding. Breasts are symmetrical.  Abdominal: Soft. Bowel sounds are normal. She exhibits no distension and no mass. There is no tenderness. There is no rebound and no guarding.  Musculoskeletal:       Right shoulder: Normal.       Left shoulder: Normal.       Cervical back: Normal.  Lymphadenopathy:    She has no cervical adenopathy.  Neurological: She is alert and oriented to person, place, and time. She has normal reflexes. No cranial nerve deficit. She exhibits normal muscle tone. Coordination normal.  Skin: Skin is warm and dry. No rash noted. She is not diaphoretic. No erythema. No pallor.  Psychiatric: She has a normal mood and affect. Her behavior is normal. Judgment and thought content normal.  Nursing note and vitals reviewed.  No results found. Depression screen [facility] 2/9 07/27/2017 05/25/2017 04/13/2017 12/15/2016 09/14/2016  Decreased Interest 0 0 0 0 0  Down, Depressed, Hopeless 0 0 0 0 0  PHQ - 2 Score 0 0 0 0 0  Altered sleeping - - - - -  Tired, decreased energy - - - - -  Change in appetite - - - - -  Feeling bad or failure about yourself  - -  - - -  Trouble concentrating - - - - -  Moving slowly or fidgety/restless - - - - -  Suicidal thoughts - - - - -  PHQ-9 Score - - - - -   Fall Risk  07/27/2017 05/25/2017 04/13/2017 12/15/2016 09/14/2016  Falls in the past year? No No No No No    Functional Status Survey: Is the patient deaf or have difficulty hearing?: No Does the patient have difficulty seeing, even when wearing glasses/contacts?: No Does the patient have difficulty concentrating, remembering, or making decisions?: No Does the patient have difficulty walking or climbing stairs?: No Does the patient have difficulty dressing or bathing?: No Does the patient have difficulty doing errands alone such as visiting a doctor's office or shopping?: No     Assessment & Plan:   1. Medicare annual wellness visit, subsequent   2. Routine physical examination   3. Essential hypertension   4. Irritable bowel syndrome with diarrhea   5. Pure hypercholesterolemia   6. Urge incontinence of urine   7. Varicose veins of right lower extremity with complications   8. Hyperglycemia   9. Class 1 obesity due to excess calories with serious comorbidity and body mass index (BMI) of 30.0 to 30.9 in adult     -anticipatory guidance provided --- exercise, weight loss, safe driving practices, aspirin 81mg  daily. -obtain age appropriate screening labs and labs for chronic disease management. -moderate fall risk; no evidence of depression; no evidence of  hearing loss.  Discussed advanced directives and living will; also discussed end of life issues including code status.   HTN: Uncontrolled due to noncompliance and intolerance of medications.  Suffered with hypokalemia with hydrochlorothiazide therapy so discontinued it after emergency room visit.  Patient now agreeable to restarting hydrochlorothiazide therapy with a daily potassium supplement.  Obtain labs for chronic disease management.  Hypercholesterolemia: Uncontrolled due to noncompliance of  statin therapy due to side effects.  Obtain labs.    I recommend weight loss, exercise, and low-cholesterol low-fat food choices.  I recommend limiting red meat to once per week; I recommend limiting fried foods to once per month.  IBS constipation alternating with diarrhea: Stable at this time.  Chronic issue for patient.  Hyperglycemia: Obtain labs for chronic disease management.  Recommend weight loss, low sugar and low carbohydrate food choices and exercise.  Obesity:  Recommend weight loss, exercise for 30-60 minutes five days per week; recommend 1200 kcal restriction per day with a minimum of 60 grams of protein per day.  Eat 3 meals per day. Do not skip meals. Consider having a protein shake as a meal replacement to aid with eliminating meal skipping. Look for products with <220 calories, <7 gm sugar, and 20-30 gm protein.  Eat breakfast within 2 hours of getting up.   Make  your plate non-starchy vegetables,  protein, and  carbohydrates at lunch and dinner.   Aim for at least 64 oz. of calorie-free beverages daily (water, Crystal Light, diet green tea, etc.). Eliminate any sugary beverages such as regular soda, sweet tea, or fruit juice.   Pay attention to hunger and fullness cues.  Stop eating once you feel satisfied; don't wait until you feel full, stuffed, or sick from eating.  Choose lean meats and low fat/fat free dairy products.  Choose foods high in fiber such as fruits, vegetables, and whole grains (brown rice, whole wheat pasta, whole wheat bread, etc.).  Limit foods with added sugar to <7 gm per serving.  Always eat in the kitchen/dining room.  Never eat in the bedroom or in front of the TV.   Orders Placed This Encounter  Procedures  . CBC with Differential/Platelet  . Comprehensive metabolic panel    Order Specific Question:   Has the patient fasted?    Answer:   No  . Hemoglobin A1c  . Lipid panel    Order Specific Question:   Has the patient fasted?     Answer:   No  . TSH  . POCT urinalysis dipstick   Meds ordered this encounter  Medications  . potassium chloride SA (K-DUR,KLOR-CON) 20 MEQ tablet    Sig: Take 1 tablet (20 mEq total) by mouth daily.    Dispense:  90 tablet    Refill:  3    Return in about 3 months (around 10/27/2017) for follow-up chronic medical conditions [facility]OE STALLINGS.   Maejor Erven Elayne Guerin, M.D. Primary Care at [facility] [address] [place], [place]  [address] [phone] phone [phone] fax

## 2017-07-27 NOTE — Patient Instructions (Addendum)
Start hydrochlorathiazide 12.58m one tablet daily. Start Klor-Con 290m one tablet daily for potassium.     IF you received an x-ray today, you will receive an invoice from [facility]. Please contact [facility] at [phone] questions or concerns regarding your invoice.   IF you received labwork today, you will receive an invoice from [place] [facility] at [phone] questions or concerns regarding your invoice.   Our billing staff will not be able to assist you with questions regarding bills from these companies.  You will be contacted with the lab results as soon as they are available. The fastest way to get your results is to activate your My Chart account. Instructions are located on the last page of this paperwork. If you have not heard from [place] the results in 2 weeks, please contact this office.      Preventive Care 6551ears and Older, Female Preventive care refers to lifestyle choices and visits with your health care provider that can promote health and wellness. What does preventive care include?  A yearly physical exam. This is also called an annual well check.  Dental exams once or twice a year.  Routine eye exams. Ask your health care provider how often you should have your eyes checked.  Personal lifestyle choices, including: ? Daily care of your teeth and gums. ? Regular physical activity. ? Eating a healthy diet. ? Avoiding tobacco and drug use. ? Limiting alcohol use. ? Practicing safe sex. ? Taking low-dose aspirin every day. ? Taking vitamin and mineral supplements as recommended by your health care provider. What happens during an annual well check? The services and screenings done by your health care provider during your annual well check will depend on your age, overall health, lifestyle risk factors, and family history of disease. Counseling Your health care provider may ask you questions about  your:  Alcohol use.  Tobacco use.  Drug use.  Emotional well-being.  Home and relationship well-being.  Sexual activity.  Eating habits.  History of falls.  Memory and ability to understand (cognition).  Work and work enStatistician Reproductive health.  Screening You may have the following tests or measurements:  Height, weight, and BMI.  Blood pressure.  Lipid and cholesterol levels. These may be checked every 5 years, or more frequently if you are over 5054ears old.  Skin check.  Lung cancer screening. You may have this screening every year starting at age 3382f you have a 30-pack-year history of smoking and currently smoke or have quit within the past 15 years.  Fecal occult blood test (FOBT) of the stool. You may have this test every year starting at age 753 Flexible sigmoidoscopy or colonoscopy. You may have a sigmoidoscopy every 5 years or a colonoscopy every 10 years starting at age 749 Hepatitis C blood test.  Hepatitis B blood test.  Sexually transmitted disease (STD) testing.  Diabetes screening. This is done by checking your blood sugar (glucose) after you have not eaten for a while (fasting). You may have this done every 1-3 years.  Bone density scan. This is done to screen for osteoporosis. You may have this done starting at age 68 Mammogram. This may be done every 1-2 years. Talk to your health care provider about how often you should have regular mammograms.  Talk with your health care provider about your test results, treatment options, and if necessary, the need for more tests. Vaccines Your health care provider  may recommend certain vaccines, such as:  Influenza vaccine. This is recommended every year.  Tetanus, diphtheria, and acellular pertussis (Tdap, Td) vaccine. You may need a Td booster every 10 years.  Varicella vaccine. You may need this if you have not been vaccinated.  Zoster vaccine. You may need this after age  60.  Measles, mumps, and rubella (MMR) vaccine. You may need at least one dose of MMR if you were born in 1957 or later. You may also need a second dose.  Pneumococcal 13-valent conjugate (PCV13) vaccine. One dose is recommended after age 7.  Pneumococcal polysaccharide (PPSV23) vaccine. One dose is recommended after age 20.  Meningococcal vaccine. You may need this if you have certain conditions.  Hepatitis A vaccine. You may need this if you have certain conditions or if you travel or work in places where you may be exposed to hepatitis A.  Hepatitis B vaccine. You may need this if you have certain conditions or if you travel or work in places where you may be exposed to hepatitis B.  Haemophilus influenzae type b (Hib) vaccine. You may need this if you have certain conditions.  Talk to your health care provider about which screenings and vaccines you need and how often you need them. This information is not intended to replace advice given to you by your health care provider. Make sure you discuss any questions you have with your health care provider. Document Released: 01/24/2015 Document Revised: 09/17/2015 Document Reviewed: 10/29/2014 Elsevier Interactive Patient Education  [employer].

## 2017-07-28 DIAGNOSIS — K219 Gastro-esophageal reflux disease without esophagitis: Secondary | ICD-10-CM | POA: Insufficient documentation

## 2017-07-28 LAB — COMPREHENSIVE METABOLIC PANEL
A/G RATIO: 1.4 (ref 1.2–2.2)
ALK PHOS: 64 IU/L (ref 39–117)
ALT: 8 IU/L (ref 0–32)
AST: 11 IU/L (ref 0–40)
Albumin: 3.9 g/dL (ref 3.6–4.8)
BILIRUBIN TOTAL: 0.4 mg/dL (ref 0.0–1.2)
BUN/Creatinine Ratio: 9 — ABNORMAL LOW (ref 12–28)
BUN: 8 mg/dL (ref 8–27)
CHLORIDE: 106 mmol/L (ref 96–106)
CO2: 23 mmol/L (ref 20–29)
Calcium: 8.8 mg/dL (ref 8.7–10.3)
Creatinine, Ser: 0.94 mg/dL (ref 0.57–1.00)
GFR calc Af Amer: 72 mL/min/{1.73_m2} (ref >59)
GFR calc non Af Amer: 63 mL/min/{1.73_m2} (ref >59)
GLOBULIN, TOTAL: 2.7 g/dL (ref 1.5–4.5)
GLUCOSE: 97 mg/dL (ref 65–99)
POTASSIUM: 4 mmol/L (ref 3.5–5.2)
SODIUM: 141 mmol/L (ref 134–144)
Total Protein: 6.6 g/dL (ref 6.0–8.5)

## 2017-07-28 LAB — CBC WITH DIFFERENTIAL/PLATELET
BASOS ABS: 0 10*3/uL (ref 0.0–0.2)
Basos: 1 %
EOS (ABSOLUTE): 0.1 10*3/uL (ref 0.0–0.4)
Eos: 1 %
Hematocrit: 41.9 % (ref 34.0–46.6)
Hemoglobin: 14 g/dL (ref 11.1–15.9)
IMMATURE GRANS (ABS): 0 10*3/uL (ref 0.0–0.1)
IMMATURE GRANULOCYTES: 0 %
LYMPHS: 57 %
Lymphocytes Absolute: 2 10*3/uL (ref 0.7–3.1)
MCH: 30.9 pg (ref 26.6–33.0)
MCHC: 33.4 g/dL (ref 31.5–35.7)
MCV: 93 fL (ref 79–97)
Monocytes Absolute: 0.3 10*3/uL (ref 0.1–0.9)
Monocytes: 10 %
NEUTROS PCT: 31 %
Neutrophils Absolute: 1.1 10*3/uL — ABNORMAL LOW (ref 1.4–7.0)
PLATELETS: 165 10*3/uL (ref 150–450)
RBC: 4.53 x10E6/uL (ref 3.77–5.28)
RDW: 12.9 % (ref 12.3–15.4)
WBC: 3.5 10*3/uL (ref 3.4–10.8)

## 2017-07-28 LAB — LIPID PANEL
CHOLESTEROL TOTAL: 246 mg/dL — AB (ref 100–199)
Chol/HDL Ratio: 5.6 ratio — ABNORMAL HIGH (ref 0.0–4.4)
HDL: 44 mg/dL (ref >39)
LDL Calculated: 175 mg/dL — ABNORMAL HIGH (ref 0–99)
Triglycerides: 135 mg/dL (ref 0–149)
VLDL CHOLESTEROL CAL: 27 mg/dL (ref 5–40)

## 2017-07-28 LAB — HEMOGLOBIN A1C
ESTIMATED AVERAGE GLUCOSE: 114 mg/dL
HEMOGLOBIN A1C: 5.6 % (ref 4.8–5.6)

## 2017-07-28 LAB — TSH: TSH: 1.54 u[IU]/mL (ref 0.450–4.500)

## 2017-08-02 DIAGNOSIS — M85851 Other specified disorders of bone density and structure, right thigh: Secondary | ICD-10-CM | POA: Insufficient documentation

## 2017-08-25 ENCOUNTER — Telehealth: Payer: Self-pay | Admitting: Family Medicine

## 2017-08-25 NOTE — Telephone Encounter (Signed)
Called pt to reschedule visit on 10/27/17. Rescheduled

## 2017-10-20 ENCOUNTER — Ambulatory Visit: Payer: Medicare Other | Admitting: Family Medicine

## 2017-10-27 ENCOUNTER — Ambulatory Visit: Payer: Medicare Other | Admitting: Family Medicine

## 2017-11-01 ENCOUNTER — Ambulatory Visit: Payer: Medicare Other | Admitting: Family Medicine

## 2017-12-05 ENCOUNTER — Other Ambulatory Visit: Payer: Self-pay

## 2017-12-05 ENCOUNTER — Encounter: Payer: Self-pay | Admitting: Family Medicine

## 2017-12-05 ENCOUNTER — Ambulatory Visit: Payer: Medicare Other | Admitting: Family Medicine

## 2017-12-05 VITALS — BP 134/72 | HR 65 | Temp 98.1°F | Resp 17 | Ht 62.6 in | Wt 169.2 lb

## 2017-12-05 DIAGNOSIS — R252 Cramp and spasm: Secondary | ICD-10-CM

## 2017-12-05 DIAGNOSIS — E785 Hyperlipidemia, unspecified: Secondary | ICD-10-CM

## 2017-12-05 DIAGNOSIS — E6609 Other obesity due to excess calories: Secondary | ICD-10-CM

## 2017-12-05 DIAGNOSIS — Z6830 Body mass index (BMI) 30.0-30.9, adult: Secondary | ICD-10-CM

## 2017-12-05 DIAGNOSIS — I10 Essential (primary) hypertension: Secondary | ICD-10-CM

## 2017-12-05 DIAGNOSIS — Z5181 Encounter for therapeutic drug level monitoring: Secondary | ICD-10-CM | POA: Diagnosis not present

## 2017-12-05 MED ORDER — ROSUVASTATIN CALCIUM 5 MG PO TABS: 5.0000 mg | ORAL_TABLET | ORAL | 0 refills | Status: DC

## 2017-12-05 MED ORDER — HYDROCHLOROTHIAZIDE 12.5 MG PO TABS: 12.5000 mg | ORAL_TABLET | Freq: Every day | ORAL | 1 refills | Status: DC

## 2017-12-05 NOTE — Patient Instructions (Addendum)
If you have lab work done today you will be contacted with your lab results within the next 2 weeks.  If you have not heard from [place] then please contact us. The fastest way to get your results is to register for My Chart.   IF you received an x-ray today, you will receive an invoice from [facility]. Please contact [facility] at [phone] with questions or concerns regarding your invoice.   IF you received labwork today, you will receive an invoice from [place]. Please contact [facility] at [phone] with questions or concerns regarding your invoice.   Our billing staff will not be able to assist you with questions regarding bills from these companies.  You will be contacted with the lab results as soon as they are available. The fastest way to get your results is to activate your My Chart account. Instructions are located on the last page of this paperwork. If you have not heard from [place] regarding the results in 2 weeks, please contact this office.    We recommend that you schedule a mammogram for breast cancer screening. Typically, you do not need a referral to do this. Please contact a local imaging center to schedule your mammogram.  [facility] - [phone]  *ask for the Radiology [place] ([place]) - [phone] or [phone]  [facility] - [phone] [place] [phone] [facility] - [phone]  *ask for the [facility] - [phone]  *ask for the Radiology Department [facility] - [phone]  *ask for the [place] - [phone] Cholesterol Cholesterol is a white, waxy, fat-like substance that is needed by the human body in small amounts. The liver makes all the cholesterol we need. Cholesterol is carried from the liver by the blood through the blood vessels. Deposits of  cholesterol (plaques) may build up on blood vessel (artery) walls. Plaques make the arteries narrower and stiffer. Cholesterol plaques increase the risk for heart attack and stroke. You cannot feel your cholesterol level even if it is very high. The only way to know that it is high is to have a blood test. Once you know your cholesterol levels, you should keep a record of the test results. Work with your health care provider to keep your levels in the desired range. What do the results mean?  Total cholesterol is a rough measure of all the cholesterol in your blood.  LDL (low-density lipoprotein) is the "bad" cholesterol. This is the type that causes plaque to build up on the artery walls. You want this level to be low.  HDL (high-density lipoprotein) is the "good" cholesterol because it cleans the arteries and carries the LDL away. You want this level to be high.  Triglycerides are fat that the body can either burn for energy or store. High levels are closely linked to heart disease. What are the desired levels of cholesterol?  Total cholesterol below 200.  LDL below 100 for people who are at risk, below 70 for people at very high risk.  HDL above 40 is good. A level of 60 or higher is considered to be protective against heart disease.  Triglycerides below 150. How can I lower my cholesterol? Diet Follow your diet program as told by your health care provider.  Choose fish or white meat chicken and [place], roasted or baked. Limit fatty cuts of red meat, fried  foods, and processed meats, such as sausage and lunch meats.  Eat lots of fresh fruits and vegetables.  Choose whole grains, beans, pasta, potatoes, and cereals.  Choose olive oil, corn oil, or canola oil, and use only small amounts.  Avoid butter, mayonnaise, shortening, or palm kernel oils.  Avoid foods with trans fats.  Drink skim or nonfat milk and eat low-fat or nonfat yogurt and cheeses. Avoid whole milk, cream, ice  cream, egg yolks, and full-fat cheeses.  Healthier desserts include angel food cake, ginger snaps, animal crackers, hard candy, popsicles, and low-fat or nonfat frozen yogurt. Avoid pastries, cakes, pies, and cookies.  Exercise  Follow your exercise program as told by your health care provider. A regular program: ? Helps to decrease LDL and raise HDL. ? Helps with weight control.  Do things that increase your activity level, such as gardening, walking, and taking the stairs.  Ask your health care provider about ways that you can be more active in your daily life.  Medicine  Take over-the-counter and prescription medicines only as told by your health care provider. ? Medicine may be prescribed by your health care provider to help lower cholesterol and decrease the risk for heart disease. This is usually done if diet and exercise have failed to bring down cholesterol levels. ? If you have several risk factors, you may need medicine even if your levels are normal.  This information is not intended to replace advice given to you by your health care provider. Make sure you discuss any questions you have with your health care provider. Document Released: 09/22/2000 Document Revised: 07/26/2015 Document Reviewed: 06/28/2015 Elsevier Interactive Patient Education  [employer].

## 2017-12-05 NOTE — Progress Notes (Signed)
Established Patient Office Visit  Subjective:  Patient ID: [name], female    DOB: 06/29/1950  Age: 67 y.o. MRN: [id number]  CC:  Chief Complaint  Patient presents with  . Medical Management of Chronic Issues    3 month follow up-former [name] pt    HPI [name] presents for  Dyslipidemia: Patient presents for evaluation of lipids.  Compliance with treatment thus far has been poor.  She stopped taking the crestor 5mg . A repeat fasting lipid profile was done.  The patient does not use medications that may worsen dyslipidemias (corticosteroids, progestins, anabolic steroids, diuretics, beta-blockers, amiodarone, cyclosporine, olanzapine). The patient exercises rarely. She does not take an aspirin due to allergies  The patient is not known to have coexisting coronary artery disease.   The 10-year ASCVD risk score [name], et al., 2013) is: 15%   Values used to calculate the score:     Age: 26 years     Sex: Female     Is Non-Hispanic African American: Yes     Diabetic: No     Tobacco smoker: No     Systolic Blood Pressure: 518 mmHg     Is BP treated: Yes     HDL Cholesterol: 41 mg/dL     Total Cholesterol: 260 mg/dL  Hypertension: Patient here for follow-up of elevated blood pressure. She is not exercising and is adherent to low salt diet.  Blood pressure is well controlled at home. Cardiac symptoms none. Patient denies chest pain, claudication, exertional chest pressure/discomfort, fatigue, irregular heart beat, near-syncope, orthopnea, palpitations and paroxysmal nocturnal dyspnea.  Cardiovascular risk factors: dyslipidemia and hypertension. Use of agents associated with hypertension: none. History of target organ damage: none. BP Readings from Last 3 Encounters:  12/05/17 134/72  07/27/17 (!) 142/82  06/10/17 135/66   Muscle cramps She reports that she was getting muscle cramps on statins Her last PCP Stefanie Forts MD put her on crestor 5mg  once weekly    She reports that she forgot to take it She gets muscle cramps with the hctz 12.5mg  She has history of varicose veins on the RLE and the hctz helps her so she does not want to stop that med  Past Medical History:  Diagnosis Date  . Allergy    Zyrtec PRN  . Anemia   . Cataract   . Colon polyp 03/11/2007  . Diverticulosis    by colonoscopy 2009  . Fibromyalgia    diagnosed by [name]. s/p rheumatology consultation  . GERD (gastroesophageal reflux disease)   . Hyperlipidemia   . Hypertension   . IBS (irritable bowel syndrome)    diarrhea, constipation alternating; s/p GI consult.  . Internal hemorrhoid    colonoscopy in 2009    Past Surgical History:  Procedure Laterality Date  . CESAREAN SECTION    . COLONOSCOPY    . ENDOVENOUS ABLATION SAPHENOUS VEIN W/ LASER Right 08/17/2016   endovenous laser ablation right greater saphenous vein by Stefanie Gens MD   . MYOMECTOMY    . TUBAL LIGATION    . UPPER GASTROINTESTINAL ENDOSCOPY      Family History  Problem Relation Age of Onset  . Hypertension Mother   . Stroke Mother        multiple CVAs/TIAs  . Heart disease Mother 43       CHF  . Cancer Father        prostate cancer  . Hypertension Sister   . Hyperlipidemia Sister   .  Glaucoma Sister   . Hypertension Brother   . Prostatitis Brother   . Hypertension Maternal Grandmother   . Hypertension Maternal Grandfather   . Drug abuse Paternal Grandmother   . Hypertension Sister   . Mental illness Sister   . Glaucoma Sister   . Colon cancer Cousin   . Pancreatic cancer Cousin   . Esophageal cancer Neg Hx   . Rectal cancer Neg Hx   . Stomach cancer Neg Hx     Social History   Socioeconomic History  . Marital status: Single    Spouse name: Not on file  . Number of children: Not on file  . Years of education: Not on file  . Highest education level: Not on file  Occupational History  . Not on file  Social Needs  . Financial resource strain: Not on file  .  Food insecurity:    Worry: Not on file    Inability: Not on file  . Transportation needs:    Medical: Not on file    Non-medical: Not on file  Tobacco Use  . Smoking status: Former Smoker    Packs/day: 2.00    Years: 27.00    Pack years: 54.00    Last attempt to quit: 10/25/1994    Years since quitting: 23.1  . Smokeless tobacco: Never Used  Substance and Sexual Activity  . Alcohol use: No    Alcohol/week: 0.0 standard drinks  . Drug use: No  . Sexual activity: Yes    Birth control/protection: Post-menopausal  Lifestyle  . Physical activity:    Days per week: Not on file    Minutes per session: Not on file  . Stress: Not on file  Relationships  . Social connections:    Talks on phone: Not on file    Gets together: Not on file    Attends religious service: Not on file    Active member of club or organization: Not on file    Attends meetings of clubs or organizations: Not on file    Relationship status: Not on file  . Intimate partner violence:    Fear of current or ex partner: Not on file    Emotionally abused: Not on file    Physically abused: Not on file    Forced sexual activity: Not on file  Other Topics Concern  . Not on file  Social History Narrative   Marital status: married x 1969; happily married; no abuse     Children: 3 children; 1 grandchildren (62yo).      Lives: with husband, son, daughter      Employment:  Homemaker      Tobacco: quit smoking in 1997; smoked x 30 years.      Alcohol: none      Drugs: none      Exercise: walking three days per week 2019      Seatbelt: 100%     Guns:  None     ADLs: independent with ADLs; no assistant device      Advance Directives: none; desires FULL CODE; no prolonged measures.    Outpatient Medications Prior to Visit  Medication Sig Dispense Refill  . dicyclomine (BENTYL) 10 MG capsule Take 1 capsule (10 mg total) by mouth 3 (three) times daily before meals. 90 capsule 2  . hydrocortisone (PROCTOCORT) 1 % CREA  Apply 1 application topically 3 (three) times daily as needed. 28 g 2  . Lidocaine, Anorectal, (RECTICARE) 5 % CREA Apply rectally twice daily 30  g 5  . potassium chloride SA (K-DUR,KLOR-CON) 20 MEQ tablet Take 1 tablet (20 mEq total) by mouth daily. 90 tablet 3  . ranitidine (ZANTAC) 300 MG tablet Take 1 tablet (300 mg total) by mouth at bedtime. 30 tablet 11  . hydrochlorothiazide (HYDRODIURIL) 12.5 MG tablet Take 1 tablet (12.5 mg total) by mouth daily. 90 tablet 1   No facility-administered medications prior to visit.     Allergies  Allergen Reactions  . Aspirin Shortness Of Breath  . Pollen Extract     Skin itching, runny eyes, sinus pressure  . Sulfa Antibiotics Itching    itching    ROS Review of Systems    Objective:    BP 134/72 (BP Location: Right Arm, Patient Position: Sitting, Cuff Size: Large)   Pulse 65   Temp 98.1 F (36.7 C) (Oral)   Resp 17   Ht 5' 2.6" (1.59 m)   Wt 169 lb 3.2 oz (76.7 kg)   SpO2 100%   BMI 30.36 kg/m  Wt Readings from Last 3 Encounters:  12/05/17 169 lb 3.2 oz (76.7 kg)  07/27/17 171 lb 12.8 oz (77.9 kg)  05/25/17 173 lb 6.4 oz (78.7 kg)    Physical Exam  Physical Exam  Constitutional: He is oriented to person, place, and time. He appears well-developed and well-nourished.  HENT:  Head: Normocephalic and atraumatic.  Eyes: Conjunctivae and EOM are normal.  Cardiovascular: Normal rate, regular rhythm, normal heart sounds and intact distal pulses.  No murmur heard. Pulmonary/Chest: Effort normal and breath sounds normal. No stridor. No respiratory distress. He has no wheezes.  Neurological: He is alert and oriented to person, place, and time.  Skin: Skin is warm. Capillary refill takes less than 2 seconds.  Psychiatric: He has a normal mood and affect. His behavior is normal. Judgment and thought content normal.    There are no preventive care reminders to display for this patient.  There are no preventive care reminders to  display for this patient.  Lab Results  Component Value Date   TSH 1.540 07/27/2017   Lab Results  Component Value Date   WBC 3.5 07/27/2017   HGB 14.0 07/27/2017   HCT 41.9 07/27/2017   MCV 93 07/27/2017   PLT 165 07/27/2017   Lab Results  Component Value Date   NA 140 12/05/2017   K 4.1 12/05/2017   CO2 24 12/05/2017   GLUCOSE 104 (H) 12/05/2017   BUN 10 12/05/2017   CREATININE 1.08 (H) 12/05/2017   BILITOT 0.3 12/05/2017   ALKPHOS 55 12/05/2017   AST 13 12/05/2017   ALT 9 12/05/2017   PROT 6.7 12/05/2017   ALBUMIN 3.9 12/05/2017   CALCIUM 9.1 12/05/2017   ANIONGAP 12 06/09/2017   Lab Results  Component Value Date   CHOL 260 (H) 12/05/2017   Lab Results  Component Value Date   HDL 41 12/05/2017   Lab Results  Component Value Date   LDLCALC 184 (H) 12/05/2017   Lab Results  Component Value Date   TRIG 174 (H) 12/05/2017   Lab Results  Component Value Date   CHOLHDL 6.3 (H) 12/05/2017   Lab Results  Component Value Date   HGBA1C 5.6 07/27/2017      Assessment & Plan:   Problem List Items Addressed This Visit      Cardiovascular and Mediastinum   Essential hypertension - Primary    Patient's blood pressure is at goal of 139/89 or less. Condition is stable. Continue current medications  and treatment plan. I recommend that you exercise for 30-45 minutes 5 days a week. I also recommend a balanced diet with fruits and vegetables every day, lean meats, and little fried foods. The DASH diet (you can find this online) is a good example of this. Continue hctz 12.5mg       Relevant Medications   rosuvastatin (CRESTOR) 5 MG tablet   hydrochlorothiazide (HYDRODIURIL) 12.5 MG tablet   Other Relevant Orders   Lipid panel (Completed)   Comprehensive metabolic panel (Completed)   Ambulatory referral to Ophthalmology     Other   Dyslipidemia    Since crestor and other statins cause muscle cramps discussed taking crestor 1-2 times a week at bedtime. Patient  is agreeable to this. She will continue lipid mgmt with diet and exercise.      Relevant Medications   rosuvastatin (CRESTOR) 5 MG tablet   Other Relevant Orders   Lipid panel (Completed)   Comprehensive metabolic panel (Completed)   Class 1 obesity due to excess calories with serious comorbidity and body mass index (BMI) of 30.0 to 30.9 in adult    Discussed exercise for weight loss and heart disease prevention       Muscle cramps    Chronic Attributed to both statins and bp meds Discussed hydration and taking a multivitamin Discussed limiting crestor to 2 times a WEEK        Other Visit Diagnoses    Encounter for medication monitoring          Meds ordered this encounter  Medications  . rosuvastatin (CRESTOR) 5 MG tablet    Sig: Take 1 tablet (5 mg total) by mouth 2 (two) times a week.    Dispense:  90 tablet    Refill:  0  . hydrochlorothiazide (HYDRODIURIL) 12.5 MG tablet    Sig: Take 1 tablet (12.5 mg total) by mouth daily.    Dispense:  90 tablet    Refill:  1    Store refills on file. Please.    Follow-up: Return in about 3 months (around 03/07/2018) for cholesterol check .    Forrest Moron, MD

## 2017-12-06 LAB — COMPREHENSIVE METABOLIC PANEL
ALK PHOS: 55 IU/L (ref 39–117)
ALT: 9 IU/L (ref 0–32)
AST: 13 IU/L (ref 0–40)
Albumin/Globulin Ratio: 1.4 (ref 1.2–2.2)
Albumin: 3.9 g/dL (ref 3.6–4.8)
BUN/Creatinine Ratio: 9 — ABNORMAL LOW (ref 12–28)
BUN: 10 mg/dL (ref 8–27)
Bilirubin Total: 0.3 mg/dL (ref 0.0–1.2)
CALCIUM: 9.1 mg/dL (ref 8.7–10.3)
CO2: 24 mmol/L (ref 20–29)
CREATININE: 1.08 mg/dL — AB (ref 0.57–1.00)
Chloride: 104 mmol/L (ref 96–106)
GFR calc Af Amer: 61 mL/min/{1.73_m2} (ref >59)
GFR calc non Af Amer: 53 mL/min/{1.73_m2} — ABNORMAL LOW (ref >59)
GLUCOSE: 104 mg/dL — AB (ref 65–99)
Globulin, Total: 2.8 g/dL (ref 1.5–4.5)
Potassium: 4.1 mmol/L (ref 3.5–5.2)
Sodium: 140 mmol/L (ref 134–144)
Total Protein: 6.7 g/dL (ref 6.0–8.5)

## 2017-12-06 LAB — LIPID PANEL
Chol/HDL Ratio: 6.3 ratio — ABNORMAL HIGH (ref 0.0–4.4)
Cholesterol, Total: 260 mg/dL — ABNORMAL HIGH (ref 100–199)
HDL: 41 mg/dL (ref >39)
LDL CALC: 184 mg/dL — AB (ref 0–99)
Triglycerides: 174 mg/dL — ABNORMAL HIGH (ref 0–149)
VLDL CHOLESTEROL CAL: 35 mg/dL (ref 5–40)

## 2017-12-09 DIAGNOSIS — R252 Cramp and spasm: Secondary | ICD-10-CM | POA: Insufficient documentation

## 2017-12-09 DIAGNOSIS — E785 Hyperlipidemia, unspecified: Secondary | ICD-10-CM | POA: Insufficient documentation

## 2017-12-09 DIAGNOSIS — Z6830 Body mass index (BMI) 30.0-30.9, adult: Secondary | ICD-10-CM

## 2017-12-09 DIAGNOSIS — E669 Obesity, unspecified: Secondary | ICD-10-CM | POA: Insufficient documentation

## 2017-12-09 DIAGNOSIS — E6609 Other obesity due to excess calories: Secondary | ICD-10-CM | POA: Insufficient documentation

## 2017-12-09 NOTE — Assessment & Plan Note (Signed)
Chronic Attributed to both statins and bp meds Discussed hydration and taking a multivitamin Discussed limiting crestor to 2 times a WEEK

## 2017-12-09 NOTE — Assessment & Plan Note (Signed)
Since crestor and other statins cause muscle cramps discussed taking crestor 1-2 times a week at bedtime. Patient is agreeable to this. She will continue lipid mgmt with diet and exercise.

## 2017-12-09 NOTE — Assessment & Plan Note (Signed)
Discussed exercise for weight loss and heart disease prevention

## 2017-12-09 NOTE — Assessment & Plan Note (Signed)
Patient's blood pressure is at goal of 139/89 or less. Condition is stable. Continue current medications and treatment plan. I recommend that you exercise for 30-45 minutes 5 days a week. I also recommend a balanced diet with fruits and vegetables every day, lean meats, and little fried foods. The DASH diet (you can find this online) is a good example of this. Continue hctz 12.5mg 

## 2018-02-13 DIAGNOSIS — H2513 Age-related nuclear cataract, bilateral: Secondary | ICD-10-CM | POA: Diagnosis not present

## 2018-02-13 DIAGNOSIS — H40013 Open angle with borderline findings, low risk, bilateral: Secondary | ICD-10-CM | POA: Diagnosis not present

## 2018-02-13 DIAGNOSIS — H04123 Dry eye syndrome of bilateral lacrimal glands: Secondary | ICD-10-CM | POA: Diagnosis not present

## 2018-03-06 ENCOUNTER — Ambulatory Visit (INDEPENDENT_AMBULATORY_CARE_PROVIDER_SITE_OTHER): Payer: Medicare Other | Admitting: Family Medicine

## 2018-03-06 ENCOUNTER — Other Ambulatory Visit: Payer: Self-pay

## 2018-03-06 ENCOUNTER — Telehealth: Payer: Self-pay | Admitting: Family Medicine

## 2018-03-06 ENCOUNTER — Encounter: Payer: Self-pay | Admitting: Family Medicine

## 2018-03-06 VITALS — BP 131/76 | HR 64 | Temp 98.3°F | Resp 20 | Ht 61.81 in | Wt 170.0 lb

## 2018-03-06 DIAGNOSIS — E781 Pure hyperglyceridemia: Secondary | ICD-10-CM

## 2018-03-06 DIAGNOSIS — I10 Essential (primary) hypertension: Secondary | ICD-10-CM | POA: Diagnosis not present

## 2018-03-06 DIAGNOSIS — Z8639 Personal history of other endocrine, nutritional and metabolic disease: Secondary | ICD-10-CM

## 2018-03-06 DIAGNOSIS — E782 Mixed hyperlipidemia: Secondary | ICD-10-CM

## 2018-03-06 DIAGNOSIS — H9313 Tinnitus, bilateral: Secondary | ICD-10-CM | POA: Diagnosis not present

## 2018-03-06 MED ORDER — FAMOTIDINE 20 MG PO TABS: 20.0000 mg | ORAL_TABLET | Freq: Two times a day (BID) | ORAL | 11 refills | Status: DC

## 2018-03-06 MED ORDER — ROSUVASTATIN CALCIUM 5 MG PO TABS: 5.0000 mg | ORAL_TABLET | ORAL | 0 refills | Status: DC

## 2018-03-06 MED ORDER — SPIRONOLACTONE 25 MG PO TABS: 12.5000 mg | ORAL_TABLET | Freq: Every day | ORAL | 2 refills | Status: DC

## 2018-03-06 NOTE — Telephone Encounter (Signed)
Dr. Nolon Rod wants patient to have a Medicare Wellness exam in 3 months.

## 2018-03-06 NOTE — Patient Instructions (Addendum)
Start spironolactone 12.5mg   Continue to monitor your foods and keep the salt down Follow up in 3 months and if there is still ringing you will get a referral for ENT    If you have lab work done today you will be contacted with your lab results within the next 2 weeks.  If you have not heard from [place] then please contact us. The fastest way to get your results is to register for My Chart.   IF you received an x-ray today, you will receive an invoice from [facility]. Please contact [facility]iology at [phone] with questions or concerns regarding your invoice.   IF you received labwork today, you will receive an invoice from [place]. Please contact [facility] at [phone] with questions or concerns regarding your invoice.   Our billing staff will not be able to assist you with questions regarding bills from these companies.  You will be contacted with the lab results as soon as they are available. The fastest way to get your results is to activate your My Chart account. Instructions are located on the last page of this paperwork. If you have not heard from [place] regarding the results in 2 weeks, please contact this office.

## 2018-03-06 NOTE — Progress Notes (Signed)
Established Patient Office Visit  Subjective:  Patient ID: [name], female    DOB: 07-27-1950  Age: 68 y.o. MRN: [id number]  CC:  Chief Complaint  Patient presents with  . Cholestrol Check    f/u-     HPI [name] presents for   Hyper cholesterolemia  Patient reports that she was not taking the Crestor.  She states that Crestor causes her to have problems and she was told at the last visit with a hip every other day or once a week but she did not take it at all because it was sent to the pharmacy.  She reports that in the past she has had a lot of muscle pain from the Crestor.  She is currently adhering to a healthy diet but no exercise. The 10-year ASCVD risk score [name]., et al., 2013) is: 14.4%   Values used to calculate the score:     Age: 17 years     Sex: Female     Is Non-Hispanic African American: Yes     Diabetic: No     Tobacco smoker: No     Systolic Blood Pressure: 175 mmHg     Is BP treated: Yes     HDL Cholesterol: 41 mg/dL     Total Cholesterol: 260 mg/dL  Hypertension Hypokalemia tinnitus Patient is currently out of her hydrochlorothiazide and potassium.  She states that whenever she takes potassium she has ringing in her ears.  She states that she was started on hydrochlorothiazide because of her intolerance of the blood pressure medications.  She states that she had palpitations with amlodipine.  She cannot tolerate lisinopril.  She was given HCTZ and was told to supplement her potassium.  She reports having bilateral ringing in her ears without any sinus congestion or balance issues.  She has never had this assessed before.  Gotten worse in the past several months since being on potassium.  BP Readings from Last 3 Encounters:  03/06/18 131/76  12/05/17 134/72  07/27/17 (!) 142/82     Past Medical History:  Diagnosis Date  . Allergy    Zyrtec PRN  . Anemia   . Cataract   . Colon polyp 03/11/2007  . Diverticulosis    by  colonoscopy 2009  . Fibromyalgia    diagnosed by Dr. Teressa Lower. s/p rheumatology consultation  . GERD (gastroesophageal reflux disease)   . Hyperlipidemia   . Hypertension   . IBS (irritable bowel syndrome)    diarrhea, constipation alternating; s/p GI consult.  . Internal hemorrhoid    colonoscopy in 2009    Past Surgical History:  Procedure Laterality Date  . CESAREAN SECTION    . COLONOSCOPY    . ENDOVENOUS ABLATION SAPHENOUS VEIN W/ LASER Right 08/17/2016   endovenous laser ablation right greater saphenous vein by Tinnie Gens MD   . MYOMECTOMY    . TUBAL LIGATION    . UPPER GASTROINTESTINAL ENDOSCOPY      Family History  Problem Relation Age of Onset  . Hypertension Mother   . Stroke Mother        multiple CVAs/TIAs  . Heart disease Mother 36       CHF  . Cancer Father        prostate cancer  . Hypertension Sister   . Hyperlipidemia Sister   . Glaucoma Sister   . Hypertension Brother   . Prostatitis Brother   . Hypertension Maternal Grandmother   . Hypertension Maternal  Grandfather   . Drug abuse Paternal Grandmother   . Hypertension Sister   . Mental illness Sister   . Glaucoma Sister   . Colon cancer Cousin   . Pancreatic cancer Cousin   . Esophageal cancer Neg Hx   . Rectal cancer Neg Hx   . Stomach cancer Neg Hx     Social History   Socioeconomic History  . Marital status: Married    Spouse name: Not on file  . Number of children: 3  . Years of education: Not on file  . Highest education level: Not on file  Occupational History  . Not on file  Social Needs  . Financial resource strain: Not on file  . Food insecurity:    Worry: Not on file    Inability: Not on file  . Transportation needs:    Medical: Not on file    Non-medical: Not on file  Tobacco Use  . Smoking status: Former Smoker    Packs/day: 2.00    Years: 27.00    Pack years: 54.00    Last attempt to quit: 10/25/1994    Years since quitting: 23.3  . Smokeless tobacco: Never  Used  Substance and Sexual Activity  . Alcohol use: No    Alcohol/week: 0.0 standard drinks  . Drug use: No  . Sexual activity: Yes    Birth control/protection: Post-menopausal  Lifestyle  . Physical activity:    Days per week: Not on file    Minutes per session: Not on file  . Stress: Not on file  Relationships  . Social connections:    Talks on phone: Not on file    Gets together: Not on file    Attends religious service: Not on file    Active member of club or organization: Not on file    Attends meetings of clubs or organizations: Not on file    Relationship status: Not on file  . Intimate partner violence:    Fear of current or ex partner: Not on file    Emotionally abused: Not on file    Physically abused: Not on file    Forced sexual activity: Not on file  Other Topics Concern  . Not on file  Social History Narrative   Marital status: married x 1969; happily married; no abuse     Children: 3 children; 1 grandchildren (68yo).      Lives: with husband, son, daughter      Employment:  Homemaker      Tobacco: quit smoking in 1997; smoked x 30 years.      Alcohol: none      Drugs: none      Exercise: walking three days per week 2019      Seatbelt: 100%     Guns:  None     ADLs: independent with ADLs; no assistant device      Advance Directives: none; desires FULL CODE; no prolonged measures.    Outpatient Medications Prior to Visit  Medication Sig Dispense Refill  . hydrocortisone (PROCTOCORT) 1 % CREA Apply 1 application topically 3 (three) times daily as needed. 28 g 2  . Lidocaine, Anorectal, (RECTICARE) 5 % CREA Apply rectally twice daily 30 g 5  . hydrochlorothiazide (HYDRODIURIL) 12.5 MG tablet Take 1 tablet (12.5 mg total) by mouth daily. 90 tablet 1  . potassium chloride SA (K-DUR,KLOR-CON) 20 MEQ tablet Take 1 tablet (20 mEq total) by mouth daily. 90 tablet 3  . dicyclomine (BENTYL) 10 MG capsule Take  1 capsule (10 mg total) by mouth 3 (three) times daily  before meals. (Patient not taking: Reported on 03/06/2018) 90 capsule 2  . ranitidine (ZANTAC) 300 MG tablet Take 1 tablet (300 mg total) by mouth at bedtime. (Patient not taking: Reported on 03/06/2018) 30 tablet 11  . rosuvastatin (CRESTOR) 5 MG tablet Take 1 tablet (5 mg total) by mouth 2 (two) times a week. (Patient not taking: Reported on 03/06/2018) 90 tablet 0   No facility-administered medications prior to visit.     Allergies  Allergen Reactions  . Aspirin Shortness Of Breath  . Pollen Extract     Skin itching, runny eyes, sinus pressure  . Sulfa Antibiotics Itching    itching    ROS Review of Systems Review of Systems  Constitutional: Negative for activity change, appetite change, chills and fever.  HENT: Negative for congestion, nosebleeds, trouble swallowing and voice change.   Respiratory: Negative for cough, shortness of breath and wheezing.   Gastrointestinal: Negative for diarrhea, nausea and vomiting.  Genitourinary: Negative for difficulty urinating, dysuria, flank pain and hematuria.  Musculoskeletal: Negative for back pain, joint swelling and neck pain.  Neurological: Negative for dizziness, speech difficulty, light-headedness and numbness.  See HPI. All other review of systems negative.     Objective:    Physical Exam  BP 131/76   Pulse 64   Temp 98.3 F (36.8 C) (Oral)   Resp 20   Ht 5' 1.81" (1.57 m)   Wt 170 lb (77.1 kg)   SpO2 100%   BMI 31.28 kg/m  Wt Readings from Last 3 Encounters:  03/06/18 170 lb (77.1 kg)  12/05/17 169 lb 3.2 oz (76.7 kg)  07/27/17 171 lb 12.8 oz (77.9 kg)   Physical Exam  Constitutional: Oriented to person, place, and time. Appears well-developed and well-nourished.  HENT:  Head: Normocephalic and atraumatic.  Eyes: Conjunctivae and EOM are normal.  Cardiovascular: Normal rate, regular rhythm, normal heart sounds and intact distal pulses.  No murmur heard. Pulmonary/Chest: Effort normal and breath sounds normal. No  stridor. No respiratory distress. Has no wheezes.  Neurological: Is alert and oriented to person, place, and time.  Skin: Skin is warm. Capillary refill takes less than 2 seconds.  Psychiatric: Has a normal mood and affect. Behavior is normal. Judgment and thought content normal.    There are no preventive care reminders to display for this patient.  There are no preventive care reminders to display for this patient.  Lab Results  Component Value Date   TSH 1.540 07/27/2017   Lab Results  Component Value Date   WBC 3.5 07/27/2017   HGB 14.0 07/27/2017   HCT 41.9 07/27/2017   MCV 93 07/27/2017   PLT 165 07/27/2017   Lab Results  Component Value Date   NA 140 12/05/2017   K 4.1 12/05/2017   CO2 24 12/05/2017   GLUCOSE 104 (H) 12/05/2017   BUN 10 12/05/2017   CREATININE 1.08 (H) 12/05/2017   BILITOT 0.3 12/05/2017   ALKPHOS 55 12/05/2017   AST 13 12/05/2017   ALT 9 12/05/2017   PROT 6.7 12/05/2017   ALBUMIN 3.9 12/05/2017   CALCIUM 9.1 12/05/2017   ANIONGAP 12 06/09/2017   Lab Results  Component Value Date   CHOL 260 (H) 12/05/2017   Lab Results  Component Value Date   HDL 41 12/05/2017   Lab Results  Component Value Date   LDLCALC 184 (H) 12/05/2017   Lab Results  Component Value Date  TRIG 174 (H) 12/05/2017   Lab Results  Component Value Date   CHOLHDL 6.3 (H) 12/05/2017   Lab Results  Component Value Date   HGBA1C 5.6 07/27/2017      Assessment & Plan:   Problem List Items Addressed This Visit      Cardiovascular and Mediastinum   Essential hypertension   -   discontinued HCTZ reviewed other medications that have been tried in the past.  At this point the risk of hypokalemia and the side effects related to that are worse than the benefits and this is impacting compliance thus will change medication to spironolactone which helps with hypertension without causing hyperkalemia.   Relevant Medications   rosuvastatin (CRESTOR) 5 MG tablet    spironolactone (ALDACTONE) 25 MG tablet    Other Visit Diagnoses    Hypertriglyceridemia    -  Primary   Relevant Medications   rosuvastatin (CRESTOR) 5 MG tablet   spironolactone (ALDACTONE) 25 MG tablet   Other Relevant Orders   Lipid panel   CMP14+EGFR   Mixed hyperlipidemia    -advised patient to take Crestor every other day and that we will recheck her liver function in 3 months   Relevant Medications   rosuvastatin (CRESTOR) 5 MG tablet   spironolactone (ALDACTONE) 25 MG tablet   Other Relevant Orders   Lipid panel   CMP14+EGFR   History of hypokalemia    -due to her history of hypo kalemia will change her blood pressure medication to spironolactone so she can stop taking the supplement.   Relevant Medications   spironolactone (ALDACTONE) 25 MG tablet   Tinnitus of both ears    -we will stop the potassium supplement for now and monitor closely.-Could be a resector however discussed with her that many people have hearing loss related to conduction sound and so if there is no improvement will refer to audiology assessment   Relevant Medications   spironolactone (ALDACTONE) 25 MG tablet      Meds ordered this encounter  Medications  . rosuvastatin (CRESTOR) 5 MG tablet    Sig: Take 1 tablet (5 mg total) by mouth 2 (two) times a week.    Dispense:  90 tablet    Refill:  0  . famotidine (PEPCID) 20 MG tablet    Sig: Take 1 tablet (20 mg total) by mouth 2 (two) times daily.    Dispense:  60 tablet    Refill:  11  . spironolactone (ALDACTONE) 25 MG tablet    Sig: Take 0.5 tablets (12.5 mg total) by mouth daily.    Dispense:  45 tablet    Refill:  2    Follow-up: Return in about 3 months (around 06/04/2018) for MEDICARE wellness exam and tinnitus.    Forrest Moron, MD

## 2018-03-07 LAB — CMP14+EGFR
A/G RATIO: 1.5 (ref 1.2–2.2)
ALBUMIN: 3.9 g/dL (ref 3.8–4.8)
ALT: 10 IU/L (ref 0–32)
AST: 10 IU/L (ref 0–40)
Alkaline Phosphatase: 58 IU/L (ref 39–117)
BUN / CREAT RATIO: 10 — AB (ref 12–28)
BUN: 11 mg/dL (ref 8–27)
Bilirubin Total: 0.4 mg/dL (ref 0.0–1.2)
CALCIUM: 8.8 mg/dL (ref 8.7–10.3)
CO2: 21 mmol/L (ref 20–29)
Chloride: 108 mmol/L — ABNORMAL HIGH (ref 96–106)
Creatinine, Ser: 1.07 mg/dL — ABNORMAL HIGH (ref 0.57–1.00)
GFR, EST AFRICAN AMERICAN: 62 mL/min/{1.73_m2} (ref >59)
GFR, EST NON AFRICAN AMERICAN: 53 mL/min/{1.73_m2} — AB (ref >59)
GLUCOSE: 100 mg/dL — AB (ref 65–99)
Globulin, Total: 2.6 g/dL (ref 1.5–4.5)
Potassium: 3.9 mmol/L (ref 3.5–5.2)
Sodium: 143 mmol/L (ref 134–144)
TOTAL PROTEIN: 6.5 g/dL (ref 6.0–8.5)

## 2018-03-07 LAB — LIPID PANEL
Chol/HDL Ratio: 5.6 ratio — ABNORMAL HIGH (ref 0.0–4.4)
Cholesterol, Total: 246 mg/dL — ABNORMAL HIGH (ref 100–199)
HDL: 44 mg/dL (ref >39)
LDL Calculated: 177 mg/dL — ABNORMAL HIGH (ref 0–99)
Triglycerides: 124 mg/dL (ref 0–149)
VLDL CHOLESTEROL CAL: 25 mg/dL (ref 5–40)

## 2018-03-14 ENCOUNTER — Telehealth: Payer: Self-pay | Admitting: *Deleted

## 2018-03-14 NOTE — Telephone Encounter (Signed)
Left patient a message schedule AWV for 8:00 on 8-5 before her appointment with Dr. Nolon Rod at 9:00.    Advised to call back if patient is not able to make this.

## 2018-03-31 ENCOUNTER — Encounter: Payer: Self-pay | Admitting: Emergency Medicine

## 2018-03-31 ENCOUNTER — Ambulatory Visit: Payer: Medicare Other | Admitting: Emergency Medicine

## 2018-03-31 ENCOUNTER — Other Ambulatory Visit: Payer: Self-pay

## 2018-03-31 DIAGNOSIS — R05 Cough: Secondary | ICD-10-CM | POA: Diagnosis not present

## 2018-03-31 DIAGNOSIS — J22 Unspecified acute lower respiratory infection: Secondary | ICD-10-CM

## 2018-03-31 DIAGNOSIS — R059 Cough, unspecified: Secondary | ICD-10-CM

## 2018-03-31 MED ORDER — HYDROCODONE-HOMATROPINE 5-1.5 MG/5ML PO SYRP: 5.0000 mL | ORAL_SOLUTION | Freq: Four times a day (QID) | ORAL | 0 refills | Status: DC | PRN

## 2018-03-31 MED ORDER — AZITHROMYCIN 250 MG PO TABS: ORAL_TABLET | ORAL | 0 refills | Status: DC

## 2018-03-31 MED ORDER — BENZONATATE 200 MG PO CAPS: 200.0000 mg | ORAL_CAPSULE | Freq: Two times a day (BID) | ORAL | 0 refills | Status: DC | PRN

## 2018-03-31 NOTE — Patient Instructions (Addendum)
     If you have lab work done today you will be contacted with your lab results within the next 2 weeks.  If you have not heard from us then please contact us. The fastest way to get your results is to register for My Chart.   IF you received an x-ray today, you will receive an invoice from [facility]. Please contact [facility] at [phone] with questions or concerns regarding your invoice.   IF you received labwork today, you will receive an invoice from [facility]. Please contact [facility] at [phone] with questions or concerns regarding your invoice.   Our billing staff will not be able to assist you with questions regarding bills from these companies.  You will be contacted with the lab results as soon as they are available. The fastest way to get your results is to activate your My Chart account. Instructions are located on the last page of this paperwork. If you have not heard from us regarding the results in 2 weeks, please contact this office.     Cough, Adult  A cough helps to clear your throat and lungs. A cough may last only 2-3 weeks (acute), or it may last longer than 8 weeks (chronic). Many different things can cause a cough. A cough may be a sign of an illness or another medical condition. Follow these instructions at home:  Pay attention to any changes in your cough.  Take medicines only as told by your doctor. ? If you were prescribed an antibiotic medicine, take it as told by your doctor. Do not stop taking it even if you start to feel better. ? Talk with your doctor before you try using a cough medicine.  Drink enough fluid to keep your pee (urine) clear or pale yellow.  If the air is dry, use a cold steam vaporizer or humidifier in your home.  Stay away from things that make you cough at work or at home.  If your cough is worse at night, try using extra pillows to raise your head up higher while you sleep.  Do not smoke, and try not to  be around smoke. If you need help quitting, ask your doctor.  Do not have caffeine.  Do not drink alcohol.  Rest as needed. Contact a doctor if:  You have new problems (symptoms).  You cough up yellow fluid (pus).  Your cough does not get better after 2-3 weeks, or your cough gets worse.  Medicine does not help your cough and you are not sleeping well.  You have pain that gets worse or pain that is not helped with medicine.  You have a fever.  You are losing weight and you do not know why.  You have night sweats. Get help right away if:  You cough up blood.  You have trouble breathing.  Your heartbeat is very fast. This information is not intended to replace advice given to you by your health care provider. Make sure you discuss any questions you have with your health care provider. Document Released: 09/10/2010 Document Revised: 06/05/2015 Document Reviewed: 03/06/2014 Elsevier Interactive Patient Education  2019 [employer]  

## 2018-03-31 NOTE — Progress Notes (Signed)
Jolinda Croak 68 y.o.   Chief Complaint  Patient presents with  . Cough    x2 weeks; productive  . Sore Throat    states it be could be from coughing    HISTORY OF PRESENT ILLNESS: This is a 68 y.o. female complaining of cough and sore throat for 2 weeks.  Cough is nonproductive green phlegm.  Also has nasal congestion and discharge.  Marland Kitchen  HPI   Prior to Admission medications   Medication Sig Start Date End Date Taking? Authorizing Provider  dicyclomine (BENTYL) 10 MG capsule Take 1 capsule (10 mg total) by mouth 3 (three) times daily before meals. 10/01/16  Yes Ladene Artist, MD  famotidine (PEPCID) 20 MG tablet Take 1 tablet (20 mg total) by mouth 2 (two) times daily. 03/06/18  Yes Stallings, Zoe A, MD  hydrocortisone (PROCTOCORT) 1 % CREA Apply 1 application topically 3 (three) times daily as needed. 07/19/16  Yes Wardell Honour, MD  Lidocaine, Anorectal, (RECTICARE) 5 % CREA Apply rectally twice daily 09/28/16  Yes Ladene Artist, MD  rosuvastatin (CRESTOR) 5 MG tablet Take 1 tablet (5 mg total) by mouth 2 (two) times a week. 03/06/18  Yes Forrest Moron, MD  spironolactone (ALDACTONE) 25 MG tablet Take 0.5 tablets (12.5 mg total) by mouth daily. 03/06/18  Yes Forrest Moron, MD    Allergies  Allergen Reactions  . Aspirin Shortness Of Breath  . Pollen Extract     Skin itching, runny eyes, sinus pressure  . Sulfa Antibiotics Itching    itching    Patient Active Problem List   Diagnosis Date Noted  . Dyslipidemia 12/09/2017  . Class 1 obesity due to excess calories with serious comorbidity and body mass index (BMI) of 30.0 to 30.9 in adult 12/09/2017  . Muscle cramps 12/09/2017  . Osteopenia of right hip 08/02/2017  . Gastroesophageal reflux disease without esophagitis 07/28/2017  . Irritable bowel syndrome with diarrhea 09/18/2016  . Urge incontinence of urine 09/18/2016  . Inflamed external hemorrhoid 07/24/2016  . Varicose veins of right lower extremity with  complications 80/99/8338  . Essential hypertension 08/10/2015  . Pure hypercholesterolemia 08/10/2015  . Glucose intolerance (impaired glucose tolerance) 08/10/2015    Past Medical History:  Diagnosis Date  . Allergy    Zyrtec PRN  . Anemia   . Cataract   . Colon polyp 03/11/2007  . Diverticulosis    by colonoscopy 2009  . Fibromyalgia    diagnosed by Dr. Teressa Lower. s/p rheumatology consultation  . GERD (gastroesophageal reflux disease)   . Hyperlipidemia   . Hypertension   . IBS (irritable bowel syndrome)    diarrhea, constipation alternating; s/p GI consult.  . Internal hemorrhoid    colonoscopy in 2009    Past Surgical History:  Procedure Laterality Date  . CESAREAN SECTION    . COLONOSCOPY    . ENDOVENOUS ABLATION SAPHENOUS VEIN W/ LASER Right 08/17/2016   endovenous laser ablation right greater saphenous vein by Tinnie Gens MD   . MYOMECTOMY    . TUBAL LIGATION    . UPPER GASTROINTESTINAL ENDOSCOPY      Social History   Socioeconomic History  . Marital status: Married    Spouse name: Not on file  . Number of children: 3  . Years of education: Not on file  . Highest education level: Not on file  Occupational History  . Not on file  Social Needs  . Financial resource strain: Not on file  .  Food insecurity:    Worry: Not on file    Inability: Not on file  . Transportation needs:    Medical: Not on file    Non-medical: Not on file  Tobacco Use  . Smoking status: Former Smoker    Packs/day: 2.00    Years: 27.00    Pack years: 54.00    Last attempt to quit: 10/25/1994    Years since quitting: 23.4  . Smokeless tobacco: Never Used  Substance and Sexual Activity  . Alcohol use: No    Alcohol/week: 0.0 standard drinks  . Drug use: No  . Sexual activity: Yes    Birth control/protection: Post-menopausal  Lifestyle  . Physical activity:    Days per week: Not on file    Minutes per session: Not on file  . Stress: Not on file  Relationships  .  Social connections:    Talks on phone: Not on file    Gets together: Not on file    Attends religious service: Not on file    Active member of club or organization: Not on file    Attends meetings of clubs or organizations: Not on file    Relationship status: Not on file  . Intimate partner violence:    Fear of current or ex partner: Not on file    Emotionally abused: Not on file    Physically abused: Not on file    Forced sexual activity: Not on file  Other Topics Concern  . Not on file  Social History Narrative   Marital status: married x 1969; happily married; no abuse     Children: 3 children; 1 grandchildren (33yo).      Lives: with husband, son, daughter      Employment:  Homemaker      Tobacco: quit smoking in 1997; smoked x 30 years.      Alcohol: none      Drugs: none      Exercise: walking three days per week 2019      Seatbelt: 100%     Guns:  None     ADLs: independent with ADLs; no assistant device      Advance Directives: none; desires FULL CODE; no prolonged measures.    Family History  Problem Relation Age of Onset  . Hypertension Mother   . Stroke Mother        multiple CVAs/TIAs  . Heart disease Mother 19       CHF  . Cancer Father        prostate cancer  . Hypertension Sister   . Hyperlipidemia Sister   . Glaucoma Sister   . Hypertension Brother   . Prostatitis Brother   . Hypertension Maternal Grandmother   . Hypertension Maternal Grandfather   . Drug abuse Paternal Grandmother   . Hypertension Sister   . Mental illness Sister   . Glaucoma Sister   . Colon cancer Cousin   . Pancreatic cancer Cousin   . Esophageal cancer Neg Hx   . Rectal cancer Neg Hx   . Stomach cancer Neg Hx      Review of Systems  Constitutional: Negative.  Negative for chills and fever.  HENT: Positive for congestion and sore throat.   Eyes: Negative.   Respiratory: Positive for cough.   Cardiovascular: Negative for chest pain and palpitations.  Gastrointestinal:  Negative.  Negative for abdominal pain, diarrhea, nausea and vomiting.  Genitourinary: Negative.   Skin: Negative.  Negative for rash.  Neurological: Negative.  Negative for dizziness and headaches.  Endo/Heme/Allergies: Negative.   All other systems reviewed and are negative.    Physical Exam Vitals signs reviewed.  Constitutional:      Appearance: Normal appearance.  HENT:     Head: Normocephalic and atraumatic.     Nose: Congestion present.     Mouth/Throat:     Mouth: Mucous membranes are moist.     Pharynx: Oropharynx is clear. Posterior oropharyngeal erythema present. No oropharyngeal exudate.  Eyes:     Extraocular Movements: Extraocular movements intact.     Conjunctiva/sclera: Conjunctivae normal.     Pupils: Pupils are equal, round, and reactive to light.  Neck:     Musculoskeletal: Normal range of motion and neck supple.  Cardiovascular:     Rate and Rhythm: Normal rate and regular rhythm.     Pulses: Normal pulses.     Heart sounds: Normal heart sounds.  Pulmonary:     Effort: Pulmonary effort is normal.     Breath sounds: Normal breath sounds.  Musculoskeletal: Normal range of motion.  Lymphadenopathy:     Cervical: No cervical adenopathy.  Skin:    General: Skin is warm and dry.     Capillary Refill: Capillary refill takes less than 2 seconds.  Neurological:     General: No focal deficit present.     Mental Status: She is alert and oriented to person, place, and time.  Psychiatric:        Mood and Affect: Mood normal.        Behavior: Behavior normal.     A total of 25 minutes was spent in the room with the patient, greater than 50% of which was in counseling/coordination of care regarding differential diagnosis, treatment, medications, prognosis, and need for follow-up if no better or worse.  ASSESSMENT & PLAN: Shaima was seen today for cough and sore throat.  Diagnoses and all orders for this visit:  Cough -     HYDROcodone-homatropine (HYCODAN)  5-1.5 MG/5ML syrup; Take 5 mLs by mouth every 6 (six) hours as needed for cough. -     benzonatate (TESSALON) 200 MG capsule; Take 1 capsule (200 mg total) by mouth 2 (two) times daily as needed for cough.  Lower respiratory infection -     azithromycin (ZITHROMAX) 250 MG tablet; Sig as indicated    Patient Instructions       If you have lab work done today you will be contacted with your lab results within the next 2 weeks.  If you have not heard from [place] then please contact us. The fastest way to get your results is to register for My Chart.   IF you received an x-ray today, you will receive an invoice from [facility]. Please contact [facility] at [phone] with questions or concerns regarding your invoice.   IF you received labwork today, you will receive an invoice from [facility]. Please contact [facility] at [phone] with questions or concerns regarding your invoice.   Our billing staff will not be able to assist you with questions regarding bills from these companies.  You will be contacted with the lab results as soon as they are available. The fastest way to get your results is to activate your My Chart account. Instructions are located on the last page of this paperwork. If you have not heard from [place] regarding the results in 2 weeks, please contact this office.      Cough, Adult  A cough helps to clear your throat  and lungs. A cough may last only 2-3 weeks (acute), or it may last longer than 8 weeks (chronic). Many different things can cause a cough. A cough may be a sign of an illness or another medical condition. Follow these instructions at home:  Pay attention to any changes in your cough.  Take medicines only as told by your doctor. ? If you were prescribed an antibiotic medicine, take it as told by your doctor. Do not stop taking it even if you start to feel better. ? Talk with your doctor before you try using a cough medicine.  Drink  enough fluid to keep your pee (urine) clear or pale yellow.  If the air is dry, use a cold steam vaporizer or humidifier in your home.  Stay away from things that make you cough at work or at home.  If your cough is worse at night, try using extra pillows to raise your head up higher while you sleep.  Do not smoke, and try not to be around smoke. If you need help quitting, ask your doctor.  Do not have caffeine.  Do not drink alcohol.  Rest as needed. Contact a doctor if:  You have new problems (symptoms).  You cough up yellow fluid (pus).  Your cough does not get better after 2-3 weeks, or your cough gets worse.  Medicine does not help your cough and you are not sleeping well.  You have pain that gets worse or pain that is not helped with medicine.  You have a fever.  You are losing weight and you do not know why.  You have night sweats. Get help right away if:  You cough up blood.  You have trouble breathing.  Your heartbeat is very fast. This information is not intended to replace advice given to you by your health care provider. Make sure you discuss any questions you have with your health care provider. Document Released: 09/10/2010 Document Revised: 06/05/2015 Document Reviewed: 03/06/2014 Elsevier Interactive Patient Education  2019 [employer]vier [employer].      Agustina Caroli, MD [facility]

## 2018-06-08 ENCOUNTER — Ambulatory Visit: Payer: Medicare Other | Admitting: Family Medicine

## 2018-06-13 ENCOUNTER — Other Ambulatory Visit: Payer: Self-pay | Admitting: Family Medicine

## 2018-06-13 ENCOUNTER — Other Ambulatory Visit: Payer: Self-pay

## 2018-06-13 ENCOUNTER — Encounter: Payer: Self-pay | Admitting: Family Medicine

## 2018-06-13 ENCOUNTER — Encounter: Payer: Self-pay | Admitting: Neurology

## 2018-06-13 ENCOUNTER — Ambulatory Visit (INDEPENDENT_AMBULATORY_CARE_PROVIDER_SITE_OTHER): Payer: Medicare Other | Admitting: Family Medicine

## 2018-06-13 VITALS — BP 158/80 | HR 72 | Temp 98.2°F | Ht 61.0 in | Wt 168.0 lb

## 2018-06-13 DIAGNOSIS — E781 Pure hyperglyceridemia: Secondary | ICD-10-CM | POA: Diagnosis not present

## 2018-06-13 DIAGNOSIS — H93A3 Pulsatile tinnitus, bilateral: Secondary | ICD-10-CM | POA: Diagnosis not present

## 2018-06-13 DIAGNOSIS — G4489 Other headache syndrome: Secondary | ICD-10-CM

## 2018-06-13 DIAGNOSIS — I10 Essential (primary) hypertension: Secondary | ICD-10-CM | POA: Diagnosis not present

## 2018-06-13 DIAGNOSIS — R42 Dizziness and giddiness: Secondary | ICD-10-CM

## 2018-06-13 MED ORDER — PROPRANOLOL HCL 20 MG PO TABS: 20.0000 mg | ORAL_TABLET | Freq: Two times a day (BID) | ORAL | 0 refills | Status: DC

## 2018-06-13 NOTE — Patient Instructions (Addendum)
  BP drug reactions Cough Lisinopril. Joint aches Losartan. Amlodipine fatigued, lightheaded, leg swelling. Metoprolol joint aches. Does not desire diuretic due to overactive bladder. Clonidine with constipation, dry mouth.    If you have lab work done today you will be contacted with your lab results within the next 2 weeks.  If you have not heard from [place] then please contact us. The fastest way to get your results is to register for My Chart.   IF you received an x-ray today, you will receive an invoice from [facility]. Please contact [facility] at [phone] with questions or concerns regarding your invoice.   IF you received labwork today, you will receive an invoice from [facility]. Please contact [facility] at [phone] with questions or concerns regarding your invoice.   Our billing staff will not be able to assist you with questions regarding bills from these companies.  You will be contacted with the lab results as soon as they are available. The fastest way to get your results is to activate your My Chart account. Instructions are located on the last page of this paperwork. If you have not heard from [place] regarding the results in 2 weeks, please contact this office.

## 2018-06-13 NOTE — Progress Notes (Signed)
Established Patient Office Visit  Subjective:  Patient ID: [name], female    DOB: December 30, 1950  Age: 68 y.o. MRN: [id number]  CC:  Chief Complaint  Patient presents with  . 3 month f/u tinnitis    tinnitis is at a bareable level right now per pt but still there    HPI [name] presents for   She reports that she has not taken her blood pressure medication in about a week due to the  She states that the tinnitus is much louder but does not resolve when she abstains from the bp medication She feels a fullness in her ear She reports that she also gets headaches  She reports that back in the late 90s she had onset of symptoms  She woke up and thought that rain was falling heavily outside but there was no rain. It was just from her ears. She states that she was seen by Neurology but she does not remember who and was diagnosed with atypical headaches after an MRI did not find any abnormalities Tylenol seems to make it worse as well  She reports that her bp medication spironolactone also makes it worse She states that she noticed that since the last office visit February 2020 the sensation of loud static would stay. It can also sound like running your finger around the edge of a glass. It fluctuates from one side of he head to the other.  If she listen to the music with an ear phone she gets popping sensation.   Hypertension and palpitations: Patient here for follow-up of elevated blood pressure. She is not exercising and is adherent to low salt diet.  Blood pressure is well controlled at home. Cardiac symptoms palpitations. Patient denies chest pain, chest pressure/discomfort, irregular heart beat, lower extremity edema and near-syncope.  Cardiovascular risk factors: dyslipidemia and hypertension. Use of agents associated with hypertension: none. History of target organ damage: none. BP Readings from Last 3 Encounters:  06/13/18 (!) 158/80  03/06/18 131/76  12/05/17 134/72    She has palpitations. This lasts a few seconds at a time mostly in the evening with 2-3 episodes a day.   She has them over a few years but is very brief. It was worse on the spironolactone.   Dyslipidemia She is not taking the Crestor because it makes it so difficulty to even talk due to confusion and fatigue and has to go lay down She states that this last about 30 minutes right after taking the Crestor.    Past Medical History:  Diagnosis Date  . Allergy    Zyrtec PRN  . Anemia   . Cataract   . Colon polyp 03/11/2007  . Diverticulosis    by colonoscopy 2009  . Fibromyalgia    diagnosed by Dr. Teressa Lower. s/p rheumatology consultation  . GERD (gastroesophageal reflux disease)   . Hyperlipidemia   . Hypertension   . IBS (irritable bowel syndrome)    diarrhea, constipation alternating; s/p GI consult.  . Internal hemorrhoid    colonoscopy in 2009    Past Surgical History:  Procedure Laterality Date  . CESAREAN SECTION    . COLONOSCOPY    . ENDOVENOUS ABLATION SAPHENOUS VEIN W/ LASER Right 08/17/2016   endovenous laser ablation right greater saphenous vein by Tinnie Gens MD   . MYOMECTOMY    . TUBAL LIGATION    . UPPER GASTROINTESTINAL ENDOSCOPY      Family History  Problem Relation Age of Onset  .  Hypertension Mother   . Stroke Mother        multiple CVAs/TIAs  . Heart disease Mother 18       CHF  . Cancer Father        prostate cancer  . Hypertension Sister   . Hyperlipidemia Sister   . Glaucoma Sister   . Hypertension Brother   . Prostatitis Brother   . Hypertension Maternal Grandmother   . Hypertension Maternal Grandfather   . Drug abuse Paternal Grandmother   . Hypertension Sister   . Mental illness Sister   . Glaucoma Sister   . Colon cancer Cousin   . Pancreatic cancer Cousin   . Esophageal cancer Neg Hx   . Rectal cancer Neg Hx   . Stomach cancer Neg Hx     Social History   Socioeconomic History  . Marital status: Married    Spouse  name: Not on file  . Number of children: 3  . Years of education: Not on file  . Highest education level: Not on file  Occupational History  . Not on file  Social Needs  . Financial resource strain: Not on file  . Food insecurity:    Worry: Not on file    Inability: Not on file  . Transportation needs:    Medical: Not on file    Non-medical: Not on file  Tobacco Use  . Smoking status: Former Smoker    Packs/day: 2.00    Years: 27.00    Pack years: 54.00    Last attempt to quit: 10/25/1994    Years since quitting: 23.6  . Smokeless tobacco: Never Used  Substance and Sexual Activity  . Alcohol use: No    Alcohol/week: 0.0 standard drinks  . Drug use: No  . Sexual activity: Yes    Birth control/protection: Post-menopausal  Lifestyle  . Physical activity:    Days per week: Not on file    Minutes per session: Not on file  . Stress: Not on file  Relationships  . Social connections:    Talks on phone: Not on file    Gets together: Not on file    Attends religious service: Not on file    Active member of club or organization: Not on file    Attends meetings of clubs or organizations: Not on file    Relationship status: Not on file  . Intimate partner violence:    Fear of current or ex partner: Not on file    Emotionally abused: Not on file    Physically abused: Not on file    Forced sexual activity: Not on file  Other Topics Concern  . Not on file  Social History Narrative   Marital status: married x 1969; happily married; no abuse     Children: 3 children; 1 grandchildren (82yo).      Lives: with husband, son, daughter      Employment:  Homemaker      Tobacco: quit smoking in 1997; smoked x 30 years.      Alcohol: none      Drugs: none      Exercise: walking three days per week 2019      Seatbelt: 100%     Guns:  None     ADLs: independent with ADLs; no assistant device      Advance Directives: none; desires FULL CODE; no prolonged measures.    Outpatient  Medications Prior to Visit  Medication Sig Dispense Refill  . azithromycin (ZITHROMAX) 250  MG tablet Sig as indicated 6 tablet 0  . benzonatate (TESSALON) 200 MG capsule Take 1 capsule (200 mg total) by mouth 2 (two) times daily as needed for cough. 20 capsule 0  . dicyclomine (BENTYL) 10 MG capsule Take 1 capsule (10 mg total) by mouth 3 (three) times daily before meals. 90 capsule 2  . famotidine (PEPCID) 20 MG tablet Take 1 tablet (20 mg total) by mouth 2 (two) times daily. 60 tablet 11  . hydrocortisone (PROCTOCORT) 1 % CREA Apply 1 application topically 3 (three) times daily as needed. 28 g 2  . Lidocaine, Anorectal, (RECTICARE) 5 % CREA Apply rectally twice daily 30 g 5  . rosuvastatin (CRESTOR) 5 MG tablet Take 1 tablet (5 mg total) by mouth 2 (two) times a week. 90 tablet 0  . spironolactone (ALDACTONE) 25 MG tablet Take 0.5 tablets (12.5 mg total) by mouth daily. 45 tablet 2  . HYDROcodone-homatropine (HYCODAN) 5-1.5 MG/5ML syrup Take 5 mLs by mouth every 6 (six) hours as needed for cough. (Patient not taking: Reported on 06/13/2018) 120 mL 0   No facility-administered medications prior to visit.     Allergies  Allergen Reactions  . Aspirin Shortness Of Breath  . Pollen Extract     Skin itching, runny eyes, sinus pressure  . Sulfa Antibiotics Itching    itching    ROS Review of Systems See hpi Review of Systems  Constitutional: Negative for activity change, appetite change, chills and fever.  HENT: Negative for congestion, nosebleeds, trouble swallowing and voice change.   Respiratory: Negative for cough, shortness of breath and wheezing.   Gastrointestinal: Negative for diarrhea, nausea and vomiting.  Genitourinary: Negative for difficulty urinating, dysuria, flank pain and hematuria.  Musculoskeletal: Negative for back pain, joint swelling and neck pain.  Neurological: Negative for dizziness, speech difficulty, light-headedness and numbness.  See HPI. All other review of  systems negative.     Objective:    Physical Exam  BP (!) 158/80 (BP Location: Left Arm, Patient Position: Sitting, Cuff Size: Normal)   Pulse 72   Temp 98.2 F (36.8 C)   Ht 5\' 1"  (1.549 m)   Wt 168 lb (76.2 kg)   SpO2 98%   BMI 31.74 kg/m  Wt Readings from Last 3 Encounters:  06/13/18 168 lb (76.2 kg)  03/06/18 170 lb (77.1 kg)  12/05/17 169 lb 3.2 oz (76.7 kg)   Physical Exam  Constitutional: Oriented to person, place, and time. Appears well-developed and well-nourished.  HENT:  Head: Normocephalic and atraumatic.  Eyes: Conjunctivae and EOM are normal.  Cardiovascular: Normal rate, regular rhythm, normal heart sounds and intact distal pulses.  No murmur heard. Pulmonary/Chest: Effort normal and breath sounds normal. No stridor. No respiratory distress. Has no wheezes.  Skin: Skin is warm. Capillary refill takes less than 2 seconds.  Psychiatric: Has a normal mood and affect. Behavior is normal. Judgment and thought content normal.   Neuro exam Neurological: Is alert and oriented to person, place, and time.  CRANIAL NERVES: CN II: Visual fields are full to confrontation. Fundoscopic exam is normal with sharp discs and no vascular changes. Pupils are round equal and briskly reactive to light. CN III, IV, VI: extraocular movement are normal. No ptosis. CN V: Facial sensation is intact to pinprick in all 3 divisions bilaterally. Corneal responses are intact.  CN VII: Face is symmetric with normal eye closure and smile. CN VIII: Hearing is normal to rubbing fingers CN IX, X: Palate elevates symmetrically.  Phonation is normal. CN XI: Head turning and shoulder shrug are intact CN XII: Tongue is midline with normal movements and no atrophy.  [place] Able to perceive vibration when placed on the crown of the head. Ringing louder on the right than the left Balance and gait: normal gait and balance Patellar reflex 2+  There are no preventive care reminders to display for  this patient.  There are no preventive care reminders to display for this patient.  Lab Results  Component Value Date   TSH 1.540 07/27/2017   Lab Results  Component Value Date   WBC 3.5 07/27/2017   HGB 14.0 07/27/2017   HCT 41.9 07/27/2017   MCV 93 07/27/2017   PLT 165 07/27/2017   Lab Results  Component Value Date   NA 143 03/06/2018   K 3.9 03/06/2018   CO2 21 03/06/2018   GLUCOSE 100 (H) 03/06/2018   BUN 11 03/06/2018   CREATININE 1.07 (H) 03/06/2018   BILITOT 0.4 03/06/2018   ALKPHOS 58 03/06/2018   AST 10 03/06/2018   ALT 10 03/06/2018   PROT 6.5 03/06/2018   ALBUMIN 3.9 03/06/2018   CALCIUM 8.8 03/06/2018   ANIONGAP 12 06/09/2017   Lab Results  Component Value Date   CHOL 246 (H) 03/06/2018   Lab Results  Component Value Date   HDL 44 03/06/2018   Lab Results  Component Value Date   LDLCALC 177 (H) 03/06/2018   Lab Results  Component Value Date   TRIG 124 03/06/2018   Lab Results  Component Value Date   CHOLHDL 5.6 (H) 03/06/2018   Lab Results  Component Value Date   HGBA1C 5.6 07/27/2017      Assessment & Plan:   Problem List Items Addressed This Visit      Cardiovascular and Mediastinum   Essential hypertension - Primary bp elevated Will add inderal for headache and it help bp   Relevant Medications   propranolol (INDERAL) 20 MG tablet    Other Visit Diagnoses    Hypertriglyceridemia    -  Reviewed labs, continue current meds   Relevant Medications   propranolol (INDERAL) 20 MG tablet   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 propranolol (INDERAL) 20 MG tablet    Sig: Take 1 tablet (20 mg total) by mouth 2 (two) times daily.    Dispense:  60 tablet    Refill:  0    Follow-up: Return in about 4 weeks (around 07/11/2018) for bp and lipid.    Forrest Moron, MD

## 2018-07-11 ENCOUNTER — Other Ambulatory Visit: Payer: Self-pay | Admitting: Family Medicine

## 2018-07-18 ENCOUNTER — Ambulatory Visit: Payer: Medicare Other | Admitting: Family Medicine

## 2018-07-25 ENCOUNTER — Telehealth (INDEPENDENT_AMBULATORY_CARE_PROVIDER_SITE_OTHER): Payer: Medicare Other | Admitting: Neurology

## 2018-07-25 ENCOUNTER — Encounter: Payer: Self-pay | Admitting: Neurology

## 2018-07-25 ENCOUNTER — Other Ambulatory Visit: Payer: Self-pay

## 2018-07-25 VITALS — Ht 61.25 in | Wt 168.0 lb

## 2018-07-25 DIAGNOSIS — R519 Headache, unspecified: Secondary | ICD-10-CM

## 2018-07-25 DIAGNOSIS — H9313 Tinnitus, bilateral: Secondary | ICD-10-CM

## 2018-07-25 DIAGNOSIS — H9192 Unspecified hearing loss, left ear: Secondary | ICD-10-CM

## 2018-07-25 DIAGNOSIS — H8111 Benign paroxysmal vertigo, right ear: Secondary | ICD-10-CM

## 2018-07-25 DIAGNOSIS — M542 Cervicalgia: Secondary | ICD-10-CM

## 2018-07-25 MED ORDER — GABAPENTIN 100 MG PO CAPS: 100.0000 mg | ORAL_CAPSULE | Freq: Every day | ORAL | 3 refills | Status: DC
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
## 2018-07-25 NOTE — Addendum Note (Signed)
Addended by: Clois Comber on: 07/25/2018 03:52 PM   Modules accepted: Orders

## 2018-07-26 ENCOUNTER — Ambulatory Visit (INDEPENDENT_AMBULATORY_CARE_PROVIDER_SITE_OTHER): Payer: Medicare Other | Admitting: Family Medicine

## 2018-07-26 ENCOUNTER — Encounter: Payer: Self-pay | Admitting: Family Medicine

## 2018-07-26 VITALS — BP 130/78 | HR 69 | Temp 98.2°F | Resp 16 | Ht 63.5 in | Wt 172.2 lb

## 2018-07-26 DIAGNOSIS — Z1239 Encounter for other screening for malignant neoplasm of breast: Secondary | ICD-10-CM | POA: Diagnosis not present

## 2018-07-26 DIAGNOSIS — G4489 Other headache syndrome: Secondary | ICD-10-CM | POA: Diagnosis not present

## 2018-07-26 DIAGNOSIS — I10 Essential (primary) hypertension: Secondary | ICD-10-CM | POA: Diagnosis not present

## 2018-07-26 DIAGNOSIS — L918 Other hypertrophic disorders of the skin: Secondary | ICD-10-CM

## 2018-07-26 DIAGNOSIS — H93A3 Pulsatile tinnitus, bilateral: Secondary | ICD-10-CM

## 2018-07-26 MED ORDER — LIDOCAINE-EPINEPHRINE 2 %-1:100000 IJ SOLN: 3.0000 mL | Freq: Once | INTRAMUSCULAR | Status: DC

## 2018-07-26 MED ORDER — PROPRANOLOL HCL 20 MG PO TABS: ORAL_TABLET | ORAL | 6 refills | Status: DC

## 2018-07-26 NOTE — Progress Notes (Addendum)
Established Patient Office Visit  Subjective:  Patient ID: [name], female    DOB: January 20, 1950  Age: 68 y.o. MRN: [id number]  CC:  Chief Complaint  Patient presents with  . bp follow up    HPI GUNEET DELPINO presents for  Headache  BP Readings from Last 3 Encounters:  08/16/18 138/80  07/26/18 130/78  06/13/18 (!) 158/80   She saw Dr. Tomi Likens for the tinnitus and headache and she has an MRI set up  She is not taking any propranolol right now She has to stop the propranolol and spironolactone when he pain increases She also cannot use eye drops without headaches She maintains her bp by eating a healthy diet She denies headache pain but took a tylenol  Past Medical History:  Diagnosis Date  . Allergy    Zyrtec PRN  . Anemia   . Cataract   . Colon polyp 03/11/2007  . Diverticulosis    by colonoscopy 2009  . Fibromyalgia    diagnosed by Dr. Teressa Lower. s/p rheumatology consultation  . GERD (gastroesophageal reflux disease)   . Hyperlipidemia   . Hypertension   . IBS (irritable bowel syndrome)    diarrhea, constipation alternating; s/p GI consult.  . Internal hemorrhoid    colonoscopy in 2009    Past Surgical History:  Procedure Laterality Date  . CESAREAN SECTION    . COLONOSCOPY    . ENDOVENOUS ABLATION SAPHENOUS VEIN W/ LASER Right 08/17/2016   endovenous laser ablation right greater saphenous vein by Tinnie Gens MD   . MYOMECTOMY    . TUBAL LIGATION    . UPPER GASTROINTESTINAL ENDOSCOPY      Family History  Problem Relation Age of Onset  . Hypertension Mother   . Stroke Mother        multiple CVAs/TIAs  . Heart disease Mother 33       CHF  . Cancer Father        prostate cancer  . Hypertension Sister   . Hyperlipidemia Sister   . Glaucoma Sister   . Hypertension Brother   . Prostatitis Brother   . Hypertension Maternal Grandmother   . Hypertension Maternal Grandfather   . Drug abuse Paternal Grandmother   . Hypertension Sister   .  Mental illness Sister   . Glaucoma Sister   . Colon cancer Cousin   . Pancreatic cancer Cousin   . Esophageal cancer Neg Hx   . Rectal cancer Neg Hx   . Stomach cancer Neg Hx     Social History   Socioeconomic History  . Marital status: Married    Spouse name: Elisabeth Cara  . Number of children: 3  . Years of education: Not on file  . Highest education level: Some college, no degree  Occupational History  . Occupation: retired  Scientific laboratory technician  . Financial resource strain: Not on file  . Food insecurity    Worry: Not on file    Inability: Not on file  . Transportation needs    Medical: Not on file    Non-medical: Not on file  Tobacco Use  . Smoking status: Former Smoker    Packs/day: 2.00    Years: 27.00    Pack years: 54.00    Quit date: 10/25/1994    Years since quitting: 23.9  . Smokeless tobacco: Never Used  Substance and Sexual Activity  . Alcohol use: No    Alcohol/week: 0.0 standard drinks  . Drug use: No  . Sexual  activity: Yes    Birth control/protection: Post-menopausal  Lifestyle  . Physical activity    Days per week: Not on file    Minutes per session: Not on file  . Stress: Not on file  Relationships  . Social Herbalist on phone: Not on file    Gets together: Not on file    Attends religious service: Not on file    Active member of club or organization: Not on file    Attends meetings of clubs or organizations: Not on file    Relationship status: Not on file  . Intimate partner violence    Fear of current or ex partner: Not on file    Emotionally abused: Not on file    Physically abused: Not on file    Forced sexual activity: Not on file  Other Topics Concern  . Not on file  Social History Narrative   Marital status: married x 1969; happily married; no abuse     Children: 3 children; 1 grandchildren (48yo).      Lives: with husband, son, daughter      Employment:  Homemaker      Tobacco: quit smoking in 1997; smoked x 30 years.       Alcohol: none      Drugs: none      Exercise: walking three days per week 2019      Seatbelt: 100%     Guns:  None     ADLs: independent with ADLs; no assistant device      Advance Directives: none; desires FULL CODE; no prolonged measures.      Patient is right-handed. She walks 3 x a week.     Outpatient Medications Prior to Visit  Medication Sig Dispense Refill  . gabapentin (NEURONTIN) 100 MG capsule Take 1 capsule (100 mg total) by mouth at bedtime. 30 capsule 3  . hydrocortisone (PROCTOCORT) 1 % CREA Apply 1 application topically 3 (three) times daily as needed. 28 g 2  . Lidocaine, Anorectal, (RECTICARE) 5 % CREA Apply rectally twice daily 30 g 5  . rosuvastatin (CRESTOR) 5 MG tablet Take 1 tablet (5 mg total) by mouth 2 (two) times a week. 90 tablet 0  . propranolol (INDERAL) 20 MG tablet TAKE 1 TABLET(20 MG) BY MOUTH TWICE DAILY 60 tablet 0   No facility-administered medications prior to visit.     Allergies  Allergen Reactions  . Aspirin Shortness Of Breath  . Pollen Extract     Skin itching, runny eyes, sinus pressure  . Sulfa Antibiotics Itching    itching    ROS Review of Systems    Objective:    Physical Exam  BP 130/78 (BP Location: Left Arm, Patient Position: Sitting, Cuff Size: Normal)   Pulse 69   Temp 98.2 F (36.8 C) (Oral)   Resp 16   Ht 5' 3.5" (1.613 m)   Wt 172 lb 3.2 oz (78.1 kg)   SpO2 99%   BMI 30.03 kg/m  Wt Readings from Last 3 Encounters:  08/16/18 171 lb 9.6 oz (77.8 kg)  07/26/18 172 lb 3.2 oz (78.1 kg)  07/25/18 168 lb (76.2 kg)   Physical Exam  Constitutional: Oriented to person, place, and time. Appears well-developed and well-nourished.  HENT:  Head: Normocephalic and atraumatic.  Eyes: Conjunctivae and EOM are normal.  Cardiovascular: Normal rate, regular rhythm, normal heart sounds and intact distal pulses.  No murmur heard. Pulmonary/Chest: Effort normal and breath sounds normal. No stridor.  No respiratory distress.  Has no wheezes.  Neurological: Is alert and oriented to person, place, and time.  Skin: Skin is warm. Capillary refill takes less than 2 seconds.  Psychiatric: Has a normal mood and affect. Behavior is normal. Judgment and thought content normal.    Health Maintenance Due  Topic Date Due  . MAMMOGRAM  07/07/2018  . INFLUENZA VACCINE  08/12/2018    There are no preventive care reminders to display for this patient.  Lab Results  Component Value Date   TSH 1.540 07/27/2017   Lab Results  Component Value Date   WBC 3.5 07/27/2017   HGB 14.0 07/27/2017   HCT 41.9 07/27/2017   MCV 93 07/27/2017   PLT 165 07/27/2017   Lab Results  Component Value Date   NA 141 08/16/2018   K 4.1 08/16/2018   CO2 20 08/16/2018   GLUCOSE 102 (H) 08/16/2018   BUN 10 08/16/2018   CREATININE 1.02 (H) 08/16/2018   BILITOT 0.3 08/16/2018   ALKPHOS 64 08/16/2018   AST 14 08/16/2018   ALT 8 08/16/2018   PROT 6.5 08/16/2018   ALBUMIN 4.0 08/16/2018   CALCIUM 9.0 08/16/2018   ANIONGAP 12 06/09/2017   Lab Results  Component Value Date   CHOL 241 (H) 08/16/2018   Lab Results  Component Value Date   HDL 46 08/16/2018   Lab Results  Component Value Date   LDLCALC 159 (H) 08/16/2018   Lab Results  Component Value Date   TRIG 178 (H) 08/16/2018   Lab Results  Component Value Date   CHOLHDL 5.2 (H) 08/16/2018   Lab Results  Component Value Date   HGBA1C 5.6 07/27/2017      Assessment & Plan:   Problem List Items Addressed This Visit      Cardiovascular and Mediastinum   Essential hypertension - Primary Inderal for headaches and helps with htn   Relevant Medications   propranolol (INDERAL) 20 MG tablet    Other Visit Diagnoses    Other headache syndrome    -  Continue with Neurology   Relevant Medications   propranolol (INDERAL) 20 MG tablet   lidocaine-EPINEPHrine (XYLOCAINE W/EPI) 2 %-1:100000 (with pres) injection 3 mL   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 propranolol (INDERAL) 20 MG tablet    Sig: TAKE 1 TABLET(20 MG) BY MOUTH TWICE DAILY    Dispense:  60 tablet    Refill:  6  . lidocaine-EPINEPHrine (XYLOCAINE W/EPI) 2 %-1:100000 (with pres) injection 3 mL    Follow-up: Return in about 3 months (around 10/26/2018) for cholesterol recheck .    Forrest Moron, MD

## 2018-07-26 NOTE — Patient Instructions (Signed)
Breast Self-Awareness Breast self-awareness means being familiar with how your breasts look and feel. It involves checking your breasts regularly and reporting any changes to your health care provider. Practicing breast self-awareness is important. Sometimes changes may not be harmful (are benign), but sometimes a change in your breasts can be a sign of a serious medical problem. It is important to learn how to do this procedure correctly so that you can catch problems early, when treatment is more likely to be successful. All women should practice breast self-awareness, including women who have had breast implants. What you need:  A mirror.  A well-lit room. How to do a breast self-exam A breast self-exam is one way to learn what is normal for your breasts and whether your breasts are changing. To do a breast self-exam: Look for changes  1. Remove all the clothing above your waist. 2. Stand in front of a mirror in a room with good lighting. 3. Put your hands on your hips. 4. Push your hands firmly downward. 5. Compare your breasts in the mirror. Look for differences between them (asymmetry), such as: ? Differences in shape. ? Differences in size. ? Puckers, dips, and bumps in one breast and not the other. 6. Look at each breast for changes in the skin, such as: ? Redness. ? Scaly areas. 7. Look for changes in your nipples, such as: ? Discharge. ? Bleeding. ? Dimpling. ? Redness. ? A change in position. Feel for changes Carefully feel your breasts for lumps and changes. It is best to do this while lying on your back on the floor, and again while sitting or standing in the tub or shower with soapy water on your skin. Feel each breast in the following way: 1. Place the arm on the side of the breast you are examining above your head. 2. Feel your breast with the other hand. 3. Start in the nipple area and make -inch (2 cm) overlapping circles to feel your breast. Use the pads of your  three middle fingers to do this. Apply light pressure, then medium pressure, then firm pressure. The light pressure will allow you to feel the tissue closest to the skin. The medium pressure will allow you to feel the tissue that is a little deeper. The firm pressure will allow you to feel the tissue close to the ribs. 4. Continue the overlapping circles, moving downward over the breast until you feel your ribs below your breast. 5. Move one finger-width toward the center of the body. Continue to use the -inch (2 cm) overlapping circles to feel your breast as you move slowly up toward your collarbone. 6. Continue the up-and-down exam using all three pressures until you reach your armpit.  Write down what you find Writing down what you find can help you remember what to discuss with your health care provider. Write down:  What is normal for each breast.  Any changes that you find in each breast, including: ? The kind of changes you find. ? Any pain or tenderness. ? Size and location of any lumps.  Where you are in your menstrual cycle, if you are still menstruating. General tips and recommendations  Examine your breasts every month.  If you are breastfeeding, the best time to examine your breasts is after a feeding or after using a breast pump.  If you menstruate, the best time to examine your breasts is 5-7 days after your period. Breasts are generally lumpier during menstrual periods, and it may  be more difficult to notice changes.  With time and practice, you will become more familiar with the variations in your breasts and more comfortable with the exam. Contact a health care provider if you:  See a change in the shape or size of your breasts or nipples.  See a change in the skin of your breast or nipples, such as a reddened or scaly area.  Have unusual discharge from your nipples.  Find a lump or thick area that was not there before.  Have pain in your breasts.  Have any  concerns related to your breast health. Summary  Breast self-awareness includes looking for physical changes in your breasts, as well as feeling for any changes within your breasts.  Breast self-awareness should be performed in front of a mirror in a well-lit room.  You should examine your breasts every month. If you menstruate, the best time to examine your breasts is 5-7 days after your menstrual period.  Let your health care provider know of any changes you notice in your breasts, including changes in size, changes on the skin, pain or tenderness, or unusual fluid from your nipples. This information is not intended to replace advice given to you by your health care provider. Make sure you discuss any questions you have with your health care provider. Document Released: 12/28/2004 Document Revised: 08/16/2017 Document Reviewed: 08/16/2017 Elsevier Patient Education  2020 [employer].

## 2018-07-26 NOTE — Progress Notes (Signed)
Skin tag removal Procedure explained Verbal consent given Area prepped and draped. Local anesthesia with lidocaine 2%-with epinephrine.  Removed with suture scissors dermabond applied Tolerated procedure well.  Home care reviewed.

## 2018-08-01 ENCOUNTER — Telehealth: Payer: Self-pay | Admitting: Neurology

## 2018-08-01 NOTE — Telephone Encounter (Signed)
Patient is calling in about MRI that was supposed to be order. She has some questions.

## 2018-08-02 NOTE — Telephone Encounter (Signed)
Called and advised Pt [name] will call her to schedule once a PA is in place. I provided her with the contact information in the event she does not receive a call within 5-7 days.

## 2018-08-07 ENCOUNTER — Telehealth: Payer: Self-pay | Admitting: *Deleted

## 2018-08-07 NOTE — Telephone Encounter (Signed)
Schedule AWV.  

## 2018-08-16 ENCOUNTER — Ambulatory Visit: Payer: Medicare Other | Admitting: Family Medicine

## 2018-08-16 ENCOUNTER — Ambulatory Visit (INDEPENDENT_AMBULATORY_CARE_PROVIDER_SITE_OTHER): Payer: Medicare Other | Admitting: Family Medicine

## 2018-08-16 ENCOUNTER — Other Ambulatory Visit: Payer: Self-pay

## 2018-08-16 VITALS — BP 141/78 | HR 62

## 2018-08-16 VITALS — BP 138/80 | HR 60 | Temp 98.1°F | Resp 16 | Ht 64.0 in | Wt 171.6 lb

## 2018-08-16 DIAGNOSIS — Z0189 Encounter for other specified special examinations: Secondary | ICD-10-CM

## 2018-08-16 DIAGNOSIS — I10 Essential (primary) hypertension: Secondary | ICD-10-CM | POA: Diagnosis not present

## 2018-08-16 DIAGNOSIS — E781 Pure hyperglyceridemia: Secondary | ICD-10-CM | POA: Diagnosis not present

## 2018-08-16 DIAGNOSIS — Z0000 Encounter for general adult medical examination without abnormal findings: Secondary | ICD-10-CM

## 2018-08-16 LAB — LIPID PANEL
Chol/HDL Ratio: 5.2 ratio — ABNORMAL HIGH (ref 0.0–4.4)
Cholesterol, Total: 241 mg/dL — ABNORMAL HIGH (ref 100–199)
HDL: 46 mg/dL (ref >39)
LDL Calculated: 159 mg/dL — ABNORMAL HIGH (ref 0–99)
Triglycerides: 178 mg/dL — ABNORMAL HIGH (ref 0–149)
VLDL Cholesterol Cal: 36 mg/dL (ref 5–40)

## 2018-08-16 LAB — COMPREHENSIVE METABOLIC PANEL
ALT: 8 IU/L (ref 0–32)
AST: 14 IU/L (ref 0–40)
Albumin/Globulin Ratio: 1.6 (ref 1.2–2.2)
Albumin: 4 g/dL (ref 3.8–4.8)
Alkaline Phosphatase: 64 IU/L (ref 39–117)
BUN/Creatinine Ratio: 10 — ABNORMAL LOW (ref 12–28)
BUN: 10 mg/dL (ref 8–27)
Bilirubin Total: 0.3 mg/dL (ref 0.0–1.2)
CO2: 20 mmol/L (ref 20–29)
Calcium: 9 mg/dL (ref 8.7–10.3)
Chloride: 108 mmol/L — ABNORMAL HIGH (ref 96–106)
Creatinine, Ser: 1.02 mg/dL — ABNORMAL HIGH (ref 0.57–1.00)
GFR calc Af Amer: 65 mL/min/{1.73_m2} (ref >59)
GFR calc non Af Amer: 56 mL/min/{1.73_m2} — ABNORMAL LOW (ref >59)
Globulin, Total: 2.5 g/dL (ref 1.5–4.5)
Glucose: 102 mg/dL — ABNORMAL HIGH (ref 65–99)
Potassium: 4.1 mmol/L (ref 3.5–5.2)
Sodium: 141 mmol/L (ref 134–144)
Total Protein: 6.5 g/dL (ref 6.0–8.5)

## 2018-08-16 NOTE — Progress Notes (Signed)
Presents today for [employer] Visit   Date of last exam: 07-26-2018  Interpreter used for this visit  No  Patient was seen in clinic at [place] at [facility]  Patient Care Team: Forrest Moron, MD as PCP - General (Internal Medicine) Pieter Partridge, DO as Consulting Physician (Neurology)   Other items to address today:   Discussed Eye/Dental Discussed immunizations  Other Screening: Last screening for diabetes: 07/27/2017 Last lipid screening:03/06/2018  ADVANCE DIRECTIVES: Discussed: yes On File: no Materials Provided:  yes  Immunization status:  Immunization History  Administered Date(s) Administered  . Influenza, High Dose Seasonal PF 10/13/2017  . Influenza,inj,Quad PF,6+ Mos 11/10/2015, 09/14/2016  . Influenza-Unspecified 03/14/2015  . Pneumococcal Conjugate-13 03/14/2015  . Pneumococcal Polysaccharide-23 07/12/2016  . Tdap 12/04/2013     Health Maintenance Due  Topic Date Due  . MAMMOGRAM  07/07/2018  . INFLUENZA VACCINE  08/12/2018     Functional Status Survey: Is the patient deaf or have difficulty hearing?: Yes(left ear ringing.  having mri) Does the patient have difficulty seeing, even when wearing glasses/contacts?: No Does the patient have difficulty concentrating, remembering, or making decisions?: No Does the patient have difficulty walking or climbing stairs?: Yes(stffness soreness) Does the patient have difficulty dressing or bathing?: No Does the patient have difficulty doing errands alone such as visiting a doctor's office or shopping?: No   6CIT Screen 08/16/2018  What Year? 0 points  What month? 0 points  What time? 0 points  Months in reverse 0 points  Repeat phrase 0 points         Home Environment:  Live in one story home   Has trouble climbing climbing stairs (with stiffness) Yes scattered rugs with grabbers No grab bars Yes adequate lighting / free clutter   Patient Active Problem List   Diagnosis Date Noted  . Dyslipidemia 12/09/2017  . Class 1 obesity due to excess calories with serious comorbidity and body mass index (BMI) of 30.0 to 30.9 in adult 12/09/2017  . Muscle cramps 12/09/2017  . Osteopenia of right hip 08/02/2017  . Gastroesophageal reflux disease without esophagitis 07/28/2017  . Irritable bowel syndrome with diarrhea 09/18/2016  . Urge incontinence of urine 09/18/2016  . Inflamed external hemorrhoid 07/24/2016  . Varicose veins of right lower extremity with complications 35/36/1443  . Essential hypertension 08/10/2015  . Pure hypercholesterolemia 08/10/2015  . Glucose intolerance (impaired glucose tolerance) 08/10/2015     Past Medical History:  Diagnosis Date  . Allergy    Zyrtec PRN  . Anemia   . Cataract   . Colon polyp 03/11/2007  . Diverticulosis    by colonoscopy 2009  . Fibromyalgia    diagnosed by Dr. Teressa Lower. s/p rheumatology consultation  . GERD (gastroesophageal reflux disease)   . Hyperlipidemia   . Hypertension   . IBS (irritable bowel syndrome)    diarrhea, constipation alternating; s/p GI consult.  . Internal hemorrhoid    colonoscopy in 2009     Past Surgical History:  Procedure Laterality Date  . CESAREAN SECTION    . COLONOSCOPY    . ENDOVENOUS ABLATION SAPHENOUS VEIN W/ LASER Right 08/17/2016   endovenous laser ablation right greater saphenous vein by Tinnie Gens MD   . MYOMECTOMY    . TUBAL LIGATION    . UPPER GASTROINTESTINAL ENDOSCOPY       Family History  Problem Relation Age of Onset  . Hypertension Mother   . Stroke Mother  multiple CVAs/TIAs  . Heart disease Mother 84       CHF  . Cancer Father        prostate cancer  . Hypertension Sister   . Hyperlipidemia Sister   . Glaucoma Sister   . Hypertension Brother   . Prostatitis Brother   . Hypertension Maternal Grandmother   . Hypertension Maternal Grandfather   . Drug abuse Paternal Grandmother   . Hypertension Sister   . Mental  illness Sister   . Glaucoma Sister   . Colon cancer Cousin   . Pancreatic cancer Cousin   . Esophageal cancer Neg Hx   . Rectal cancer Neg Hx   . Stomach cancer Neg Hx      Social History   Socioeconomic History  . Marital status: Married    Spouse name: Elisabeth Cara  . Number of children: 3  . Years of education: Not on file  . Highest education level: Some college, no degree  Occupational History  . Occupation: retired  Scientific laboratory technician  . Financial resource strain: Not on file  . Food insecurity    Worry: Not on file    Inability: Not on file  . Transportation needs    Medical: Not on file    Non-medical: Not on file  Tobacco Use  . Smoking status: Former Smoker    Packs/day: 2.00    Years: 27.00    Pack years: 54.00    Quit date: 10/25/1994    Years since quitting: 23.8  . Smokeless tobacco: Never Used  Substance and Sexual Activity  . Alcohol use: No    Alcohol/week: 0.0 standard drinks  . Drug use: No  . Sexual activity: Yes    Birth control/protection: Post-menopausal  Lifestyle  . Physical activity    Days per week: Not on file    Minutes per session: Not on file  . Stress: Not on file  Relationships  . Social Herbalist on phone: Not on file    Gets together: Not on file    Attends religious service: Not on file    Active member of club or organization: Not on file    Attends meetings of clubs or organizations: Not on file    Relationship status: Not on file  . Intimate partner violence    Fear of current or ex partner: Not on file    Emotionally abused: Not on file    Physically abused: Not on file    Forced sexual activity: Not on file  Other Topics Concern  . Not on file  Social History Narrative   Marital status: married x 1969; happily married; no abuse     Children: 3 children; 1 grandchildren (65yo).      Lives: with husband, son, daughter      Employment:  Homemaker      Tobacco: quit smoking in 1997; smoked x 30 years.       Alcohol: none      Drugs: none      Exercise: walking three days per week 2019      Seatbelt: 100%     Guns:  None     ADLs: independent with ADLs; no assistant device      Advance Directives: none; desires FULL CODE; no prolonged measures.      Patient is right-handed. She walks 3 x a week.      Allergies  Allergen Reactions  . Aspirin Shortness Of Breath  . Pollen Extract  Skin itching, runny eyes, sinus pressure  . Sulfa Antibiotics Itching    itching     Prior to Admission medications   Medication Sig Start Date End Date Taking? Authorizing Provider  gabapentin (NEURONTIN) 100 MG capsule Take 1 capsule (100 mg total) by mouth at bedtime. 07/25/18  Yes Jaffe, Adam R, DO  hydrocortisone (PROCTOCORT) 1 % CREA Apply 1 application topically 3 (three) times daily as needed. 07/19/16  Yes Wardell Honour, MD  Lidocaine, Anorectal, (RECTICARE) 5 % CREA Apply rectally twice daily 09/28/16  Yes Ladene Artist, MD  propranolol (INDERAL) 20 MG tablet TAKE 1 TABLET(20 MG) BY MOUTH TWICE DAILY 07/26/18  Yes Stallings, Zoe A, MD  rosuvastatin (CRESTOR) 5 MG tablet Take 1 tablet (5 mg total) by mouth 2 (two) times a week. 03/06/18   Forrest Moron, MD     Depression screen [facility] 2/9 08/16/2018 07/26/2018 06/13/2018 03/31/2018 03/06/2018  Decreased Interest 0 0 0 0 0  Down, Depressed, Hopeless 0 0 0 0 0  PHQ - 2 Score 0 0 0 0 0  Altered sleeping - - - - -  Tired, decreased energy - - - - -  Change in appetite - - - - -  Feeling bad or failure about yourself  - - - - -  Trouble concentrating - - - - -  Moving slowly or fidgety/restless - - - - -  Suicidal thoughts - - - - -  PHQ-9 Score - - - - -     Fall Risk  08/16/2018 07/25/2018 06/13/2018 03/31/2018 03/06/2018  Falls in the past year? 0 0 0 0 0  Number falls in past yr: 0 - 0 - 0  Injury with Fall? 0 - 0 - 0  Follow up Education provided;Falls prevention discussed;Falls evaluation completed - Falls evaluation completed - Falls evaluation  completed      PHYSICAL EXAM: Pulse 60   Temp 98.1 F (36.7 C)   Resp 16   Ht 5\' 4"  (1.626 m)   Wt 171 lb 9.6 oz (77.8 kg)   SpO2 98%   BMI 29.46 kg/m    Wt Readings from Last 3 Encounters:  08/16/18 171 lb 9.6 oz (77.8 kg)  07/26/18 172 lb 3.2 oz (78.1 kg)  07/25/18 168 lb (76.2 kg)      Hearing Screening   125Hz  250Hz  500Hz  1000Hz  2000Hz  3000Hz  4000Hz  6000Hz  8000Hz   Right ear:           Left ear:             Visual Acuity Screening   Right eye Left eye Both eyes  Without correction:     With correction: 20/30 20/30 20/30       Physical Exam   Education/Counseling provided regarding diet and exercise, prevention of chronic diseases, smoking/tobacco cessation, if applicable, and reviewed "Covered Medicare Preventive Services."

## 2018-08-16 NOTE — Patient Instructions (Signed)
Thank you for taking time to come for your Medicare Wellness Visit. I appreciate your ongoing commitment to your health goals. Please review the following plan we discussed and let me know if I can assist you in the future.    LPN  Preventive Care 69 Years and Older, Female Preventive care refers to lifestyle choices and visits with your health care provider that can promote health and wellness. This includes:  A yearly physical exam. This is also called an annual well check.  Regular dental and eye exams.  Immunizations.  Screening for certain conditions.  Healthy lifestyle choices, such as diet and exercise. What can I expect for my preventive care visit? Physical exam Your health care provider will check:  Height and weight. These may be used to calculate body mass index (BMI), which is a measurement that tells if you are at a healthy weight.  Heart rate and blood pressure.  Your skin for abnormal spots. Counseling Your health care provider may ask you questions about:  Alcohol, tobacco, and drug use.  Emotional well-being.  Home and relationship well-being.  Sexual activity.  Eating habits.  History of falls.  Memory and ability to understand (cognition).  Work and work environment.  Pregnancy and menstrual history. What immunizations do I need?  Influenza (flu) vaccine  This is recommended every year. Tetanus, diphtheria, and pertussis (Tdap) vaccine  You may need a Td booster every 10 years. Varicella (chickenpox) vaccine  You may need this vaccine if you have not already been vaccinated. Zoster (shingles) vaccine  You may need this after age 60. Pneumococcal conjugate (PCV13) vaccine  One dose is recommended after age 65. Pneumococcal polysaccharide (PPSV23) vaccine  One dose is recommended after age 65. Measles, mumps, and rubella (MMR) vaccine  You may need at least one dose of MMR if you were born in 1957 or later. You may also  need a second dose. Meningococcal conjugate (MenACWY) vaccine  You may need this if you have certain conditions. Hepatitis A vaccine  You may need this if you have certain conditions or if you travel or work in places where you may be exposed to hepatitis A. Hepatitis B vaccine  You may need this if you have certain conditions or if you travel or work in places where you may be exposed to hepatitis B. Haemophilus influenzae type b (Hib) vaccine  You may need this if you have certain conditions. You may receive vaccines as individual doses or as more than one vaccine together in one shot (combination vaccines). Talk with your health care provider about the risks and benefits of combination vaccines. What tests do I need? Blood tests  Lipid and cholesterol levels. These may be checked every 5 years, or more frequently depending on your overall health.  Hepatitis C test.  Hepatitis B test. Screening  Lung cancer screening. You may have this screening every year starting at age 55 if you have a 30-pack-year history of smoking and currently smoke or have quit within the past 15 years.  Colorectal cancer screening. All adults should have this screening starting at age 50 and continuing until age 75. Your health care provider may recommend screening at age 45 if you are at increased risk. You will have tests every 1-10 years, depending on your results and the type of screening test.  Diabetes screening. This is done by checking your blood sugar (glucose) after you have not eaten for a while (fasting). You may have this done every 1-3   years.  Mammogram. This may be done every 1-2 years. Talk with your health care provider about how often you should have regular mammograms.  BRCA-related cancer screening. This may be done if you have a family history of breast, ovarian, tubal, or peritoneal cancers. Other tests  Sexually transmitted disease (STD) testing.  Bone density scan. This is done  to screen for osteoporosis. You may have this done starting at age 65. Follow these instructions at home: Eating and drinking  Eat a diet that includes fresh fruits and vegetables, whole grains, lean protein, and low-fat dairy products. Limit your intake of foods with high amounts of sugar, saturated fats, and salt.  Take vitamin and mineral supplements as recommended by your health care provider.  Do not drink alcohol if your health care provider tells you not to drink.  If you drink alcohol: ? Limit how much you have to 0-1 drink a day. ? Be aware of how much alcohol is in your drink. In the [place] one drink equals one 12 oz bottle of beer (355 mL), one 5 oz glass of wine (148 mL), or one 1 oz glass of hard liquor (44 mL). Lifestyle  Take daily care of your teeth and gums.  Stay active. Exercise for at least 30 minutes on 5 or more days each week.  Do not use any products that contain nicotine or tobacco, such as cigarettes, e-cigarettes, and chewing tobacco. If you need help quitting, ask your health care provider.  If you are sexually active, practice safe sex. Use a condom or other form of protection in order to prevent STIs (sexually transmitted infections).  Talk with your health care provider about taking a low-dose aspirin or statin. What's next?  Go to your health care provider once a year for a well check visit.  Ask your health care provider how often you should have your eyes and teeth checked.  Stay up to date on all vaccines. This information is not intended to replace advice given to you by your health care provider. Make sure you discuss any questions you have with your health care provider. Document Released: 01/24/2015 Document Revised: 12/22/2017 Document Reviewed: 12/22/2017 Elsevier Patient Education  2020 [employer]  

## 2018-08-25 ENCOUNTER — Telehealth: Payer: Self-pay | Admitting: Neurology

## 2018-08-25 NOTE — Telephone Encounter (Signed)
Called and spoke with Pt. she is requesting something to help her relax for MRI.  I advised her she will need to have a driver on the day of her imaging study, and to check with her pharmacy (confirmed) on Monday.

## 2018-08-25 NOTE — Telephone Encounter (Signed)
Patient left VM stating that she has a MRI Tuesday and she is trying to get her medication for that. Thanks!

## 2018-08-28 ENCOUNTER — Other Ambulatory Visit: Payer: Self-pay | Admitting: Neurology

## 2018-08-28 MED ORDER — DIAZEPAM 5 MG PO TABS: ORAL_TABLET | ORAL | 0 refills | Status: DC

## 2018-08-28 NOTE — Telephone Encounter (Signed)
Sent prescription for Valium to [facility].  She should be aware that she will need a driver to and from the MRI

## 2018-08-29 ENCOUNTER — Ambulatory Visit
Admission: RE | Admit: 2018-08-29 | Discharge: 2018-08-29 | Disposition: A | Discharge status: Home / Self Care | Payer: Medicare Other | Source: Ambulatory Visit | Attending: Neurology | Admitting: Neurology

## 2018-08-29 ENCOUNTER — Other Ambulatory Visit: Payer: Self-pay

## 2018-08-29 DIAGNOSIS — H9313 Tinnitus, bilateral: Secondary | ICD-10-CM

## 2018-08-29 DIAGNOSIS — H9192 Unspecified hearing loss, left ear: Secondary | ICD-10-CM

## 2018-08-29 DIAGNOSIS — R519 Headache, unspecified: Secondary | ICD-10-CM

## 2018-08-29 DIAGNOSIS — H9312 Tinnitus, left ear: Secondary | ICD-10-CM | POA: Diagnosis not present

## 2018-08-29 DIAGNOSIS — H8111 Benign paroxysmal vertigo, right ear: Secondary | ICD-10-CM

## 2018-08-29 MED ORDER — GADOBENATE DIMEGLUMINE 529 MG/ML IV SOLN
15.0000 mL | Freq: Once | INTRAVENOUS | Status: AC | PRN
  Administered 2018-08-29: 15 mL via INTRAVENOUS

## 2018-09-01 NOTE — Telephone Encounter (Signed)
-----   Message from Pieter Partridge, DO sent at 08/31/2018 12:37 PM EDT ----- MRI of brain and internal auditory canals look unremarkable.  No cause seen for ringing in ear

## 2018-09-01 NOTE — Telephone Encounter (Signed)
Left message of the results

## 2018-09-23 NOTE — Progress Notes (Signed)
Lab visit

## 2018-09-27 ENCOUNTER — Other Ambulatory Visit: Payer: Self-pay

## 2018-09-27 ENCOUNTER — Ambulatory Visit
Admission: RE | Admit: 2018-09-27 | Discharge: 2018-09-27 | Disposition: A | Discharge status: Home / Self Care | Payer: Medicare Other | Source: Ambulatory Visit | Attending: Family Medicine | Admitting: Family Medicine

## 2018-09-27 DIAGNOSIS — Z1231 Encounter for screening mammogram for malignant neoplasm of breast: Secondary | ICD-10-CM | POA: Diagnosis not present

## 2018-09-27 DIAGNOSIS — Z1239 Encounter for other screening for malignant neoplasm of breast: Secondary | ICD-10-CM

## 2018-09-29 ENCOUNTER — Other Ambulatory Visit: Payer: Self-pay | Admitting: Family Medicine

## 2018-09-29 DIAGNOSIS — R928 Other abnormal and inconclusive findings on diagnostic imaging of breast: Secondary | ICD-10-CM

## 2018-10-04 ENCOUNTER — Ambulatory Visit
Admission: RE | Admit: 2018-10-04 | Discharge: 2018-10-04 | Disposition: A | Discharge status: Home / Self Care | Payer: Medicare Other | Source: Ambulatory Visit | Attending: Family Medicine | Admitting: Family Medicine

## 2018-10-04 ENCOUNTER — Other Ambulatory Visit: Payer: Self-pay

## 2018-10-04 DIAGNOSIS — R928 Other abnormal and inconclusive findings on diagnostic imaging of breast: Secondary | ICD-10-CM

## 2018-10-04 DIAGNOSIS — N6489 Other specified disorders of breast: Secondary | ICD-10-CM | POA: Diagnosis not present
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
## 2018-11-27 ENCOUNTER — Encounter: Payer: Self-pay | Admitting: Neurology

## 2018-11-27 ENCOUNTER — Ambulatory Visit: Payer: Medicare Other | Admitting: Neurology

## 2018-11-27 ENCOUNTER — Other Ambulatory Visit: Payer: Self-pay

## 2018-11-27 VITALS — BP 149/80 | HR 64 | Ht 61.0 in | Wt 171.0 lb

## 2018-11-27 DIAGNOSIS — H8111 Benign paroxysmal vertigo, right ear: Secondary | ICD-10-CM | POA: Diagnosis not present

## 2018-11-27 DIAGNOSIS — R519 Headache, unspecified: Secondary | ICD-10-CM | POA: Diagnosis not present

## 2018-11-27 DIAGNOSIS — H9313 Tinnitus, bilateral: Secondary | ICD-10-CM

## 2018-11-27 MED ORDER — NORTRIPTYLINE HCL 10 MG PO CAPS: 10.0000 mg | ORAL_CAPSULE | Freq: Every day | ORAL | 3 refills | Status: DC

## 2018-11-27 NOTE — Patient Instructions (Signed)
1.  Start nortriptyline 10mg  at bedtime.  If headaches not improved in 4 weeks, then contact me and we can increase dose. 2.  Follow up with dentist regarding jaw pain 3.  Follow up with me in 4 months.

## 2019-02-08 ENCOUNTER — Telehealth: Payer: Self-pay | Admitting: Family Medicine

## 2019-02-08 NOTE — Telephone Encounter (Signed)
lvm to see if she can come in on 02/13/2019 instead of the original appt due to the provider not being in the office that day

## 2019-02-13 ENCOUNTER — Encounter: Payer: Medicare Other | Admitting: Family Medicine

## 2019-02-14 ENCOUNTER — Other Ambulatory Visit: Payer: Self-pay

## 2019-02-14 ENCOUNTER — Encounter: Payer: Self-pay | Admitting: Family Medicine

## 2019-02-14 ENCOUNTER — Telehealth (INDEPENDENT_AMBULATORY_CARE_PROVIDER_SITE_OTHER): Payer: Medicare Other | Admitting: Family Medicine

## 2019-02-14 VITALS — BP 147/77 | HR 68 | Ht 61.75 in | Wt 167.0 lb

## 2019-02-14 DIAGNOSIS — I10 Essential (primary) hypertension: Secondary | ICD-10-CM

## 2019-02-14 DIAGNOSIS — D259 Leiomyoma of uterus, unspecified: Secondary | ICD-10-CM

## 2019-02-14 DIAGNOSIS — G43009 Migraine without aura, not intractable, without status migrainosus: Secondary | ICD-10-CM

## 2019-02-14 DIAGNOSIS — R42 Dizziness and giddiness: Secondary | ICD-10-CM | POA: Diagnosis not present

## 2019-02-14 MED ORDER — AMLODIPINE BESYLATE 5 MG PO TABS: 5.0000 mg | ORAL_TABLET | Freq: Every day | ORAL | 0 refills | Status: DC

## 2019-02-14 MED ORDER — VERAPAMIL HCL 80 MG PO TABS: 80.0000 mg | ORAL_TABLET | Freq: Two times a day (BID) | ORAL | 1 refills | Status: DC

## 2019-02-14 NOTE — Progress Notes (Signed)
Telemedicine Encounter- SOAP NOTE Established Patient  This telephone encounter was conducted with the patient's (or proxy's) verbal consent via audio telecommunications: yes/no: Yes Patient was instructed to have this encounter in a suitably private space; and to only have persons present to whom they give permission to participate. In addition, patient identity was confirmed by use of name plus two identifiers (DOB and address).  I discussed the limitations, risks, security and privacy concerns of performing an evaluation and management service by telephone and the availability of in person appointments. I also discussed with the patient that there may be a patient responsible charge related to this service. The patient expressed understanding and agreed to proceed.  I spent a total of TIME; 0 MIN TO 60 MIN: 25 minutes talking with the patient or their proxy.  Chief Complaint  Patient presents with  . Medication Refill    per pt will discuss at telemed appt at 1700, not sure if the doctor will medication    Subjective   [name] is a 69 y.o. established patient. Telephone visit today for  HPI   Pt reports that she went to see Dr. Tomi Likens for migraines States that she does not like propranolol because of dizziness Reviewed of Dr. Georgie Chard notes suggests that patient has BPPV She complains of tinnitus and dizziness She also feels like her headaches are a pressure that goes one side to the next  Hypertension She reports that she has monitored her bp for months and she has readings that were [id number] systolic She states that she feels pressure in her head when her reading is high She denies any blurry vision  Uterine Fibroid She has a history of fibroid of the uterus.  She denies any bleeding but states that her abdomen seems large and her fibroid is growing.  She denies any pelvic pain.     Patient Active Problem List   Diagnosis Date Noted  . Dyslipidemia 12/09/2017  .  Class 1 obesity due to excess calories with serious comorbidity and body mass index (BMI) of 30.0 to 30.9 in adult 12/09/2017  . Muscle cramps 12/09/2017  . Osteopenia of right hip 08/02/2017  . Gastroesophageal reflux disease without esophagitis 07/28/2017  . Irritable bowel syndrome with diarrhea 09/18/2016  . Urge incontinence of urine 09/18/2016  . Inflamed external hemorrhoid 07/24/2016  . Varicose veins of right lower extremity with complications [id number]  . Essential hypertension 08/10/2015  . Pure hypercholesterolemia 08/10/2015  . Glucose intolerance (impaired glucose tolerance) 08/10/2015    Past Medical History:  Diagnosis Date  . Allergy    Zyrtec PRN  . Anemia   . Cataract   . Colon polyp 03/11/2007  . Diverticulosis    by colonoscopy 2009  . Fibromyalgia    diagnosed by Dr. Teressa Lower. s/p rheumatology consultation  . GERD (gastroesophageal reflux disease)   . Hyperlipidemia   . Hypertension   . IBS (irritable bowel syndrome)    diarrhea, constipation alternating; s/p GI consult.  . Internal hemorrhoid    colonoscopy in 2009    Current Outpatient Medications  Medication Sig Dispense Refill  . gabapentin (NEURONTIN) 100 MG capsule Take 1 capsule (100 mg total) by mouth at bedtime. 30 capsule 3  . hydrocortisone (PROCTOCORT) 1 % CREA Apply 1 application topically 3 (three) times daily as needed. 28 g 2  . nortriptyline (PAMELOR) 10 MG capsule Take 1 capsule (10 mg total) by mouth at bedtime. 30 capsule 3  . Lidocaine, Anorectal, (  RECTICARE) 5 % CREA Apply rectally twice daily 30 g 5  . verapamil (CALAN) 80 MG tablet Take 1 tablet (80 mg total) by mouth 2 (two) times daily. 60 tablet 1   Current Facility-Administered Medications  Medication Dose Route Frequency Provider Last Rate Last Admin  . lidocaine-EPINEPHrine (XYLOCAINE W/EPI) 2 %-1:100000 (with pres) injection 3 mL  3 mL Intradermal Once Forrest Moron, MD        Allergies  Allergen Reactions  .  Aspirin Shortness Of Breath  . Pollen Extract     Skin itching, runny eyes, sinus pressure  . Sulfa Antibiotics Itching    itching    Social History   Socioeconomic History  . Marital status: Married    Spouse name: Elisabeth Cara  . Number of children: 3  . Years of education: Not on file  . Highest education level: Some college, no degree  Occupational History  . Occupation: retired  Tobacco Use  . Smoking status: Former Smoker    Packs/day: 2.00    Years: 27.00    Pack years: 54.00    Quit date: 10/25/1994    Years since quitting: 24.3  . Smokeless tobacco: Never Used  Substance and Sexual Activity  . Alcohol use: No    Alcohol/week: 0.0 standard drinks  . Drug use: No  . Sexual activity: Yes    Birth control/protection: Post-menopausal  Other Topics Concern  . Not on file  Social History Narrative   Marital status: married x 1969; happily married; no abuse     Children: 3 children; 1 grandchildren (48yo).      Lives: with husband, son, daughter      Employment:  Homemaker      Tobacco: quit smoking in 1997; smoked x 30 years.      Alcohol: none      Drugs: none      Exercise: walking three days per week 2019      Seatbelt: 100%     Guns:  None     ADLs: independent with ADLs; no assistant device      Advance Directives: none; desires FULL CODE; no prolonged measures.      Patient is right-handed. She walks 3 x a week.    Social Determinants of Health   Financial Resource Strain:   . Difficulty of Paying Living Expenses: Not on file  Food Insecurity:   . Worried About Charity fundraiser in the Last Year: Not on file  . Ran Out of Food in the Last Year: Not on file  Transportation Needs:   . Lack of Transportation (Medical): Not on file  . Lack of Transportation (Non-Medical): Not on file  Physical Activity:   . Days of Exercise per Week: Not on file  . Minutes of Exercise per Session: Not on file  Stress:   . Feeling of Stress : Not on file  Social  Connections:   . Frequency of Communication with Friends and Family: Not on file  . Frequency of Social Gatherings with Friends and Family: Not on file  . Attends Religious Services: Not on file  . Active Member of Clubs or Organizations: Not on file  . Attends Archivist Meetings: Not on file  . Marital Status: Not on file  Intimate Partner Violence:   . Fear of Current or Ex-Partner: Not on file  . Emotionally Abused: Not on file  . Physically Abused: Not on file  . Sexually Abused: Not on file  ROS See hpi  Objective   Vitals as reported by the patient: Today's Vitals   02/14/19 1603  BP: (!) 147/77  Pulse: 68  Weight: 167 lb (75.8 kg)  Height: 5' 1.75" (1.568 m)   Unable to perform physical exam  Rhyleigh was seen today for medication refill.  Diagnoses and all orders for this visit:  Vertigo-  LIKELY INNER EAR  -     Cancel: Ambulatory referral to Physical Therapy -     verapamil (CALAN) 80 MG tablet; Take 1 tablet (80 mg total) by mouth 2 (two) times daily. -     Ambulatory referral to Physical Therapy  Uterine leiomyoma, unspecified location- referred to Gyne for [place] -     Ambulatory referral to Obstetrics / Gynecology  Migraine without aura and without status migrainosus, not intractable - changed propanolol to verapamil -     verapamil (CALAN) 80 MG tablet; Take 1 tablet (80 mg total) by mouth 2 (two) times daily.  Essential hypertension - Patient's blood pressure is at goal of 139/89 or less. Condition is stable. Continue current medications and treatment plan. I recommend that you exercise for 30-45 minutes 5 days a week. I also recommend a balanced diet with fruits and vegetables every day, lean meats, and little fried foods. The DASH diet (you can find this online) is a good example of this.  -     verapamil (CALAN) 80 MG tablet; Take 1 tablet (80 mg total) by mouth 2 (two) times daily.     I discussed the assessment and treatment plan with  the patient. The patient was provided an opportunity to ask questions and all were answered. The patient agreed with the plan and demonstrated an understanding of the instructions.   The patient was advised to call back or seek an in-person evaluation if the symptoms worsen or if the condition fails to improve as anticipated.  I provided 25 minutes of non-face-to-face time during this encounter.  Forrest Moron, MD  Primary Care at [facility]

## 2019-02-14 NOTE — Addendum Note (Signed)
Addended by: Delia Chimes A on: 02/14/2019 05:10 PM   Modules accepted: Orders

## 2019-02-16 ENCOUNTER — Other Ambulatory Visit: Payer: Self-pay | Admitting: Family Medicine

## 2019-02-16 ENCOUNTER — Encounter: Payer: Medicare Other | Admitting: Family Medicine

## 2019-02-16 DIAGNOSIS — R42 Dizziness and giddiness: Secondary | ICD-10-CM

## 2019-02-16 DIAGNOSIS — I10 Essential (primary) hypertension: Secondary | ICD-10-CM

## 2019-02-16 DIAGNOSIS — G43009 Migraine without aura, not intractable, without status migrainosus: Secondary | ICD-10-CM

## 2019-03-02 ENCOUNTER — Other Ambulatory Visit: Payer: Self-pay

## 2019-03-05 ENCOUNTER — Other Ambulatory Visit: Payer: Self-pay

## 2019-03-05 ENCOUNTER — Ambulatory Visit: Payer: Medicare Other | Admitting: Obstetrics and Gynecology

## 2019-03-05 ENCOUNTER — Encounter: Payer: Self-pay | Admitting: Obstetrics and Gynecology

## 2019-03-05 VITALS — BP 124/82 | Ht 61.0 in | Wt 168.0 lb

## 2019-03-05 DIAGNOSIS — D259 Leiomyoma of uterus, unspecified: Secondary | ICD-10-CM | POA: Diagnosis not present

## 2019-03-05 DIAGNOSIS — R232 Flushing: Secondary | ICD-10-CM

## 2019-03-05 DIAGNOSIS — Z124 Encounter for screening for malignant neoplasm of cervix: Secondary | ICD-10-CM | POA: Diagnosis not present

## 2019-03-05 DIAGNOSIS — K644 Residual hemorrhoidal skin tags: Secondary | ICD-10-CM | POA: Diagnosis not present

## 2019-03-05 DIAGNOSIS — Z01411 Encounter for gynecological examination (general) (routine) with abnormal findings: Secondary | ICD-10-CM

## 2019-03-05 NOTE — Patient Instructions (Addendum)
We will plan to repeat the DEXA/bone density scan this year so please schedule when able.  Continue weight bearing exercise, and vitamin D/calcium intake. I would consider hemorrhoid surgery given the extent of the hemorrhoid noted on your exam today and the symptoms you have been having We will keep an eye on your fibroid at your annual pelvic exam visit

## 2019-03-05 NOTE — Progress Notes (Signed)
BETSELOT COWPER 1950-05-28 [id number]  SUBJECTIVE:  69 y.o. G3P3 female for annual routine gynecologic exam and Pap smear. She has no gynecologic concerns.    Current Outpatient Medications  Medication Sig Dispense Refill  . gabapentin (NEURONTIN) 100 MG capsule Take 1 capsule (100 mg total) by mouth at bedtime. 30 capsule 3  . verapamil (CALAN) 80 MG tablet TAKE 1 TABLET(80 MG) BY MOUTH TWICE DAILY 180 tablet 0  . hydrocortisone (PROCTOCORT) 1 % CREA Apply 1 application topically 3 (three) times daily as needed. (Patient not taking: Reported on 03/05/2019) 28 g 2  . Lidocaine, Anorectal, (RECTICARE) 5 % CREA Apply rectally twice daily (Patient not taking: Reported on 03/05/2019) 30 g 5  . nortriptyline (PAMELOR) 10 MG capsule Take 1 capsule (10 mg total) by mouth at bedtime. (Patient not taking: Reported on 03/05/2019) 30 capsule 3   Current Facility-Administered Medications  Medication Dose Route Frequency Provider Last Rate Last Admin  . lidocaine-EPINEPHrine (XYLOCAINE W/EPI) 2 %-1:100000 (with pres) injection 3 mL  3 mL Intradermal Once Stallings, Zoe A, MD       Allergies: Aspirin, Pollen extract, and Sulfa antibiotics  No LMP recorded. Patient is postmenopausal.  Past medical history,surgical history, problem list, medications, allergies, family history and social history were all reviewed and documented as reviewed in the EPIC chart.  ROS:  Feeling well. No dyspnea or chest pain on exertion.  No abdominal pain, change in bowel habits, black or bloody stools.  No urinary tract symptoms. GYN ROS: no abnormal bleeding, pelvic pain or discharge, no breast pain or new or enlarging lumps on self exam. No neurological complaints.    OBJECTIVE:  BP 124/82   Ht 5\' 1"  (1.549 m)   Wt 168 lb (76.2 kg)   BMI 31.74 kg/m  The patient appears well, alert, oriented x 3, in no distress. ENT normal.  Neck supple. No cervical or supraclavicular adenopathy or thyromegaly.  Lungs are clear, good  air entry, no wheezes, rhonchi or rales. S1 and S2 normal, no murmurs, regular  rate and rhythm.  Abdomen soft without tenderness, guarding, mass or organomegaly.  Neurological is normal, no focal findings.  BREAST EXAM: breasts appear normal, no suspicious masses, no skin or nipple changes or axillary nodes  PELVIC EXAM: VULVA: normal appearing vulva with no masses, tenderness or lesions, VAGINA: normal appearing vagina with normal color and discharge, no lesions, CERVIX: normal appearing cervix without discharge or lesions, UTERUS: Bulky, measures about 12 weeks size, mobile, nontender, ADNEXA: normal adnexa in size, nontender and no masses, RECTAL: large external rectal masses present which appear to be thrombosed external hemorrhoids measuring 3-4 cm in size and protruding out from the rectum, PAP: Pap smear done today  Chaperone: Caryn Bee present during the examination  ASSESSMENT:  69 y.o. G3P3 here for annual gynecologic exam  PLAN:  1. Postmenopausal.  No vaginal bleeding.  Is having some hot flashes but says they are manageable and is not interested in therapy at this time. 2. Fibroid uterus.  Asymptomatic at this time.  The last imaging report that I see that mentions fibroids is from 06/10/2017, a CT of the abdomen/pelvis indicating a calcified fibroid measuring 3.4 cm.  She says that around the time after she had her last childbirth she knew her fibroid was about the size of a 'grapefruit' at that time.  Fibroids typically shrink in the menopause and should not cause problems, rarely is a malignancy present and this would often manifest as a  fast growing uterine mass, and her uterus is just marginally enlarged bulky on exam today.  She is reassured.  I do recommend annual pelvic exams to track this finding.  If she is to develop any significant abdominal bloating, pressure, or pain, she should let us know, although there are several other potential causes for these kinds of symptoms,  particularly with her history of bowel issues noted in her chart.  Also let us know if any vaginal bleeding. 3. Pap smear 11/2013.  Pap smear is collected today.  He reports no prior history of abnormal Pap smears.  4. Mammogram 09/2018.  Normal breast exam today.  She is reminded to schedule an annual mammogram this year when due. 5. Hemorrhoids. Severely enlarged and thrombosed and/or inflammed. I strongly recommended that she have these re-evaluated by GI since she is having symptoms and they appear very impressive on exam today. 6. Colonoscopy 2017.  Recommended that she follow up at the recommended interval.  7. Osteopenia. DEXA 08/2016.  T-score -2.1 at right femoral neck.  FRAX 0.8% / 5.0%.  Next DEXA recommended as soon as possible so she plans to schedule this. 8. Health maintenance.  No labs today as she normally has these completed with her primary care provider.   Return annually or sooner, prn.  Joseph Pierini MD  03/05/19

## 2019-03-06 LAB — PAP IG W/ RFLX HPV ASCU

## 2019-03-09 ENCOUNTER — Ambulatory Visit: Payer: Medicare Other | Attending: Internal Medicine

## 2019-03-09 DIAGNOSIS — Z23 Encounter for immunization: Secondary | ICD-10-CM | POA: Insufficient documentation

## 2019-03-09 NOTE — Progress Notes (Signed)
   Covid-19 Vaccination Clinic  Name:  [name]    MRN: [id number] DOB: [birth year]  03/09/2019  [name] was observed post Covid-19 immunization for 15 minutes without incidence. She was provided with Vaccine Information Sheet and instruction to access the V-Safe system.   [name] was instructed to call 911 with any severe reactions post vaccine: Marland Kitchen Difficulty breathing  . Swelling of your face and throat  . A fast heartbeat  . A bad rash all over your body  . Dizziness and weakness    Immunizations Administered    Name Date Dose VIS Date Route   Pfizer COVID-19 Vaccine 03/09/2019  2:16 PM 0.3 mL 12/22/2018 Intramuscular   Manufacturer: [place]   Lot: [id number]   [place]: [id number]

## 2019-03-12 ENCOUNTER — Ambulatory Visit: Payer: Medicare Other

## 2019-03-20 ENCOUNTER — Ambulatory Visit: Payer: Medicare Other | Admitting: Physical Therapy

## 2019-03-27 ENCOUNTER — Ambulatory Visit: Payer: Medicare Other | Admitting: Neurology

## 2019-04-03 ENCOUNTER — Ambulatory Visit: Payer: Medicare Other | Attending: Family Medicine | Admitting: Physical Therapy

## 2019-04-03 ENCOUNTER — Encounter: Payer: Self-pay | Admitting: Physical Therapy

## 2019-04-03 ENCOUNTER — Other Ambulatory Visit: Payer: Self-pay

## 2019-04-03 VITALS — BP 168/81 | HR 68

## 2019-04-03 DIAGNOSIS — R42 Dizziness and giddiness: Secondary | ICD-10-CM

## 2019-04-04 ENCOUNTER — Ambulatory Visit: Payer: Medicare Other | Attending: Internal Medicine

## 2019-04-04 DIAGNOSIS — Z23 Encounter for immunization: Secondary | ICD-10-CM

## 2019-04-04 NOTE — Progress Notes (Signed)
   Covid-19 Vaccination Clinic  Name:  [name]    MRN: [id number] DOB: 18-Dec-1950  04/04/2019  [name] was observed post Covid-19 immunization for 15 minutes without incident. She was provided with Vaccine Information Sheet and instruction to access the V-Safe system.   [name] was instructed to call 911 with any severe reactions post vaccine: Marland Kitchen Difficulty breathing  . Swelling of face and throat  . A fast heartbeat  . A bad rash all over body  . Dizziness and weakness   Immunizations Administered    Name Date Dose VIS Date Route   Pfizer COVID-19 Vaccine 04/04/2019  2:12 PM 0.3 mL 12/22/2018 Intramuscular   Manufacturer: [place]   Lot: [id number]   [place]: [id number]

## 2019-04-04 NOTE — Therapy (Signed)
[facility] [address] [place], [place], [address] Phone: [phone]   Fax:  [phone]  Physical Therapy Evaluation  Patient Details  Name: [name] MRN: [id number] Date of Birth: 1950-01-18 Referring Provider (PT): Dr. Delia Chimes   Encounter Date: 04/03/2019  PT End of Session - 04/04/19 1708    Visit Number  1    Authorization Type  [employer] Mcr    PT Start Time  1316    PT Stop Time  1402    PT Time Calculation (min)  46 min    Activity Tolerance  Patient tolerated treatment well    Behavior During Therapy  [facility] for tasks assessed/performed       Past Medical History:  Diagnosis Date  . Allergy    Zyrtec PRN  . Anemia   . Cataract   . Colon polyp 03/11/2007  . Diverticulosis    by colonoscopy 2009  . Fibromyalgia    diagnosed by Dr. Teressa Lower. s/p rheumatology consultation  . GERD (gastroesophageal reflux disease)   . Hyperlipidemia   . Hypertension   . IBS (irritable bowel syndrome)    diarrhea, constipation alternating; s/p GI consult.  . Internal hemorrhoid    colonoscopy in 2009    Past Surgical History:  Procedure Laterality Date  . CESAREAN SECTION    . COLONOSCOPY    . ENDOVENOUS ABLATION SAPHENOUS VEIN W/ LASER Right 08/17/2016   endovenous laser ablation right greater saphenous vein by Tinnie Gens MD   . MYOMECTOMY    . TUBAL LIGATION    . UPPER GASTROINTESTINAL ENDOSCOPY      Vitals:   04/03/19 1352  BP: (!) 168/81  Pulse: 68     Subjective Assessment - 04/03/19 1325    Subjective  Pt reports she has dizziness intermittently but constant tinnitus; states she has headaches (h/o migraines);  feels that the pressure starts in Rt ear and moves around to Lt ear but sometimes feels that the pressure is in both ears; denies dizziness at this time.  Pt states she had nausea a couple of weeks ago    Pertinent History  h/o migraines, tinnitus, headaches, osteopenia, HTN     Patient Stated Goals  "to alleviate problem of dizziness, tinnitus, and headache"    Currently in Pain?  No/denies         [facility] PT Assessment - 04/04/19 0001      Assessment   Medical Diagnosis  Vertigo    Referring Provider (PT)  Dr. Delia Chimes    Onset Date/Surgical Date  --   Nov. 2020     Precautions   Precautions  None      Restrictions   Weight Bearing Restrictions  No      Balance Screen   Has the patient fallen in the past 6 months  No    Has the patient had a decrease in activity level because of a fear of falling?   No    Is the patient reluctant to leave their home because of a fear of falling?   No      Prior Function   Level of Independence  Independent           Vestibular Assessment - 04/04/19 0001      Vestibular Assessment   General Observation  Pt is a 69 yr old lady with c/o severe tinnitus, headaches and Rt ear fullness       Symptom Behavior   Subjective  history of current problem  pt states she has history of migraines as a child; pt states her goal for PT is to eliminate the tinnitus     Type of Dizziness   "Funny feeling in head"    Frequency of Dizziness  varies    Duration of Dizziness  varies    Symptom Nature  Spontaneous    Aggravating Factors  No known aggravating factors    Relieving Factors  Medication;Closing eyes    Progression of Symptoms  Worse      Oculomotor Exam   Oculomotor Alignment  Normal    Spontaneous  Absent    Smooth Pursuits  Intact    Saccades  Intact      Positional Testing   Dix-Hallpike  Dix-Hallpike Right;Dix-Hallpike Left    Sidelying Test  Sidelying Right;Sidelying Left      Dix-Hallpike Right   Dix-Hallpike Right Duration  none    Dix-Hallpike Right Symptoms  No nystagmus      Dix-Hallpike Left   Dix-Hallpike Left Duration  none    Dix-Hallpike Left Symptoms  No nystagmus      Sidelying Right   Sidelying Right Duration  c/o pressure but no nystagmus in Rt sidelying position    Sidelying  Right Symptoms  No nystagmus      Sidelying Left   Sidelying Left Duration  none    Sidelying Left Symptoms  No nystagmus          Objective measurements completed on examination: See above findings.              PT Education - 04/04/19 1705    Education Details  informed pt of [employer] program for tinnitus; recommend pt to follow up with MD regarding elevated BP reading at today's eval;  also pt may benefit from referral to ENT for further diagnostic work up for dizziness as pt has no signs of BPPV at today's eval    Person(s) Educated  Patient    Methods  Explanation;Demonstration    Comprehension  Verbalized understanding          PT Long Term Goals - 04/04/19 1715      PT LONG TERM GOAL #1   Title  N/A = eval only             Plan - 04/04/19 1709    Clinical Impression Statement  Pt has no signs or symptoms of BPPV at today's eval but does c/o severe tinnitus (with her goal for PT to alleviate this problem) and c/o fullness in Rt ear which she states usually spreads behind head and over to her Lt ear.  Pt does have elevated BP at today's eval - states she has been taking her BP medication every other day due to side effects.  Pt may benefit from referral to ENT for further diagnostic testing to determine etiology of vertigo.  Pt was informed that PT is unable to effect/change the tinnitus.    Personal Factors and Comorbidities  Past/Current Experience;Comorbidity 1;Time since onset of injury/illness/exacerbation    Comorbidities  h/o migraines, osteopenia, tinnitus, HTN, headaches    Examination-Activity Limitations  Locomotion Level    Examination-Participation Restrictions  Community Activity;Meal Prep;Cleaning    Stability/Clinical Decision Making  Stable/Uncomplicated    Clinical Decision Making  Low    PT Frequency  One time visit    PT Treatment/Interventions  ADLs/Self Care Home Management;Neuromuscular re-education;Patient/family education;Vestibular     PT Next Visit Plan  N/A - pt recommended  to follow up with PCP regarding elevated BP and to discuss referral to ENT    Consulted and Agree with Plan of Care  Patient       Patient will benefit from skilled therapeutic intervention in order to improve the following deficits and impairments:  Dizziness  Visit Diagnosis: Dizziness and giddiness - Plan: PT plan of care cert/re-cert     Problem List Patient Active Problem List   Diagnosis Date Noted  . Dyslipidemia 12/09/2017  . Class 1 obesity due to excess calories with serious comorbidity and body mass index (BMI) of 30.0 to 30.9 in adult 12/09/2017  . Muscle cramps 12/09/2017  . Osteopenia of right hip 08/02/2017  . Gastroesophageal reflux disease without esophagitis 07/28/2017  . Irritable bowel syndrome with diarrhea 09/18/2016  . Urge incontinence of urine 09/18/2016  . Inflamed external hemorrhoid 07/24/2016  . Varicose veins of right lower extremity with complications 123XX123  . Essential hypertension 08/10/2015  . Pure hypercholesterolemia 08/10/2015  . Glucose intolerance (impaired glucose tolerance) 08/10/2015    Cory Rama, [place], PT 04/04/2019, 5:21 PM  [facility] [address] [place], [place], [address] Phone: [phone]   Fax:  [phone]  Name: [name] MRN: [id number] Date of Birth: [birth year]

## 2019-05-02 ENCOUNTER — Encounter: Payer: Self-pay | Admitting: Family Medicine

## 2019-05-02 ENCOUNTER — Other Ambulatory Visit: Payer: Self-pay

## 2019-05-02 ENCOUNTER — Ambulatory Visit (INDEPENDENT_AMBULATORY_CARE_PROVIDER_SITE_OTHER): Payer: Medicare Other | Admitting: Family Medicine

## 2019-05-02 VITALS — BP 152/90 | HR 77 | Temp 97.1°F | Resp 18 | Ht 61.0 in | Wt 171.8 lb

## 2019-05-02 DIAGNOSIS — Z0001 Encounter for general adult medical examination with abnormal findings: Secondary | ICD-10-CM

## 2019-05-02 DIAGNOSIS — R739 Hyperglycemia, unspecified: Secondary | ICD-10-CM

## 2019-05-02 DIAGNOSIS — Z0000 Encounter for general adult medical examination without abnormal findings: Secondary | ICD-10-CM

## 2019-05-02 DIAGNOSIS — E781 Pure hyperglyceridemia: Secondary | ICD-10-CM | POA: Diagnosis not present

## 2019-05-02 DIAGNOSIS — E7800 Pure hypercholesterolemia, unspecified: Secondary | ICD-10-CM

## 2019-05-02 DIAGNOSIS — N3941 Urge incontinence: Secondary | ICD-10-CM

## 2019-05-02 DIAGNOSIS — I10 Essential (primary) hypertension: Secondary | ICD-10-CM | POA: Diagnosis not present

## 2019-05-02 NOTE — Progress Notes (Signed)
QUICK REFERENCE INFORMATION: The ABCs of Providing the Annual Wellness Visit  CMS.gov Medicare Learning Network  [employer] Annual Wellness Visit  Subjective:   [name] is a 69 y.o. Female who presents for an Annual Wellness Visit.  Patient reports that she felt very tired with the blood pressure medication and her heart felt like it was fluttering with wheezing at night.  She states that she stopped the bp medication the mucus and wheezing cleared up.  She reports that her jaw pain seems to improve when she stopped the blood pressure medication.  She states that she was holding her jaw to chew but seems like the popping and holding when she chews.  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 She is seeing Orthodontist for her jaw pain.   The 10-year ASCVD risk score [name], et al., 2013) is: 18.4%   Values used to calculate the score:     Age: 39 years     Sex: Female     Is Non-Hispanic African American: Yes     Diabetic: No     Tobacco smoker: No     Systolic Blood Pressure: 0000000 mmHg     Is BP treated: Yes     HDL Cholesterol: 46 mg/dL     Total Cholesterol: 241 mg/dL   Patient reports that the verapamil helped the headaches but she is concerned about other effects. She has tried  Amlodipine Propranolol hctz Losartan Lisinopril Clonidine   Patient Active Problem List   Diagnosis Date Noted  . Dyslipidemia 12/09/2017  . Class 1 obesity due to excess calories with serious comorbidity and body mass index (BMI) of 30.0 to 30.9 in adult 12/09/2017  . Muscle cramps 12/09/2017  . Osteopenia of right hip 08/02/2017  . Gastroesophageal reflux disease without esophagitis 07/28/2017  . Irritable bowel syndrome with diarrhea 09/18/2016  . Urge incontinence of urine 09/18/2016  . Inflamed external hemorrhoid 07/24/2016  . Varicose veins of right lower extremity with complications [id number]  . Essential hypertension 08/10/2015  . Pure hypercholesterolemia 08/10/2015    . Glucose intolerance (impaired glucose tolerance) 08/10/2015    Past Medical History:  Diagnosis Date  . Allergy    Zyrtec PRN  . Anemia   . Cataract   . Colon polyp 03/11/2007  . Diverticulosis    by colonoscopy 2009  . Fibromyalgia    diagnosed by Dr. Teressa Lower. s/p rheumatology consultation  . GERD (gastroesophageal reflux disease)   . Hyperlipidemia   . Hypertension   . IBS (irritable bowel syndrome)    diarrhea, constipation alternating; s/p GI consult.  . Internal hemorrhoid    colonoscopy in 2009     Past Surgical History:  Procedure Laterality Date  . CESAREAN SECTION    . COLONOSCOPY    . ENDOVENOUS ABLATION SAPHENOUS VEIN W/ LASER Right 08/17/2016   endovenous laser ablation right greater saphenous vein by Tinnie Gens MD   . MYOMECTOMY    . TUBAL LIGATION    . UPPER GASTROINTESTINAL ENDOSCOPY       Outpatient Medications Prior to Visit  Medication Sig Dispense Refill  . cyclobenzaprine (FLEXERIL) 10 MG tablet Take 10 mg by mouth once. Once daily at bedtime    . gabapentin (NEURONTIN) 100 MG capsule Take 1 capsule (100 mg total) by mouth at bedtime. 30 capsule 3  . hydrocortisone (PROCTOCORT) 1 % CREA Apply 1 application topically 3 (three) times daily as needed. 28 g 2  . Lidocaine, Anorectal, (RECTICARE) 5 %  CREA Apply rectally twice daily 30 g 5  . nortriptyline (PAMELOR) 10 MG capsule Take 1 capsule (10 mg total) by mouth at bedtime. (Patient not taking: Reported on 03/05/2019) 30 capsule 3  . verapamil (CALAN) 80 MG tablet TAKE 1 TABLET(80 MG) BY MOUTH TWICE DAILY (Patient not taking: Reported on 05/02/2019) 180 tablet 0   Facility-Administered Medications Prior to Visit  Medication Dose Route Frequency Provider Last Rate Last Admin  . lidocaine-EPINEPHrine (XYLOCAINE W/EPI) 2 %-1:100000 (with pres) injection 3 mL  3 mL Intradermal Once Forrest Moron, MD        Allergies  Allergen Reactions  . Aspirin Shortness Of Breath  . Pollen Extract      Skin itching, runny eyes, sinus pressure  . Sulfa Antibiotics Itching    itching     Family History  Problem Relation Age of Onset  . Hypertension Mother   . Stroke Mother        multiple CVAs/TIAs  . Heart disease Mother 51       CHF  . Cancer Father        prostate cancer  . Hypertension Sister   . Hyperlipidemia Sister   . Glaucoma Sister   . Hypertension Brother   . Prostatitis Brother   . Hypertension Maternal Grandmother   . Hypertension Maternal Grandfather   . Hypertension Sister   . Esophageal cancer Neg Hx   . Rectal cancer Neg Hx   . Stomach cancer Neg Hx      Social History   Socioeconomic History  . Marital status: Married    Spouse name: Elisabeth Cara  . Number of children: 3  . Years of education: Not on file  . Highest education level: Some college, no degree  Occupational History  . Occupation: retired  Tobacco Use  . Smoking status: Former Smoker    Packs/day: 2.00    Years: 27.00    Pack years: 54.00    Quit date: 10/25/1994    Years since quitting: 24.5  . Smokeless tobacco: Never Used  Substance and Sexual Activity  . Alcohol use: No    Alcohol/week: 0.0 standard drinks  . Drug use: No  . Sexual activity: Yes    Birth control/protection: Post-menopausal    Comment: 1st intercourse 73 yo-1 partner  Other Topics Concern  . Not on file  Social History Narrative   Marital status: married x 1969; happily married; no abuse     Children: 3 children; 1 grandchildren (26yo).      Lives: with husband, son, daughter      Employment:  Homemaker      Tobacco: quit smoking in 1997; smoked x 30 years.      Alcohol: none      Drugs: none      Exercise: walking three days per week 2019      Seatbelt: 100%     Guns:  None     ADLs: independent with ADLs; no assistant device      Advance Directives: none; desires FULL CODE; no prolonged measures.      Patient is right-handed. She walks 3 x a week.    Social Determinants of Health   Financial  Resource Strain:   . Difficulty of Paying Living Expenses:   Food Insecurity:   . Worried About Charity fundraiser in the Last Year:   . Arboriculturist in the Last Year:   Transportation Needs:   . Film/video editor (Medical):   Marland Kitchen  Lack of Transportation (Non-Medical):   Physical Activity:   . Days of Exercise per Week:   . Minutes of Exercise per Session:   Stress:   . Feeling of Stress :   Social Connections:   . Frequency of Communication with Friends and Family:   . Frequency of Social Gatherings with Friends and Family:   . Attends Religious Services:   . Active Member of Clubs or Organizations:   . Attends Archivist Meetings:   Marland Kitchen Marital Status:       Recent Hospitalizations? No  Health Habits: Current exercise activities include: none Exercise: 0 times/week. Diet: in general, a "healthy" diet    Alcohol intake: none  Health Risk Assessment: The patient has completed a Health Risk Assessment. This has been reveiwed with them and has been scanned into the [facility] system as an attached document.  Current Medical Providers and Suppliers: [facility] Patient Care Team: Forrest Moron, MD as PCP - General (Internal Medicine) Pieter Partridge, DO as Consulting Physician (Neurology) No future appointments.  Age-appropriate Screening Schedule: The list below includes current immunization status and future screening recommendations based on patient's age. Orders for these recommended tests are listed in the plan section. The patient has been provided with a written plan. Immunization History  Administered Date(s) Administered  . Fluad Quad(high Dose 65+) 10/12/2018  . Influenza, High Dose Seasonal PF 10/13/2017  . Influenza,inj,Quad PF,6+ Mos 11/10/2015, 09/14/2016  . Influenza-Unspecified 03/14/2015  . PFIZER SARS-COV-2 Vaccination 03/09/2019, 04/04/2019  . Pneumococcal Conjugate-13 03/14/2015  . Pneumococcal Polysaccharide-23 07/12/2016  . Tdap 12/04/2013      Health Maintenance reviewed -  patient asked to schedule her mammogram   Depression Screen-PHQ2/9 completed today  Depression screen [facility] 2/9 05/02/2019 02/14/2019 08/16/2018 07/26/2018 06/13/2018  Decreased Interest 0 0 0 0 0  Down, Depressed, Hopeless 0 0 0 0 0  PHQ - 2 Score 0 0 0 0 0  Altered sleeping - - - - -  Tired, decreased energy - - - - -  Change in appetite - - - - -  Feeling bad or failure about yourself  - - - - -  Trouble concentrating - - - - -  Moving slowly or fidgety/restless - - - - -  Suicidal thoughts - - - - -  PHQ-9 Score - - - - -       Depression Severity and Treatment Recommendations:  0-4= None  5-9= Mild / Treatment: Support, educate to call if worse; return in one month  10-14= Moderate / Treatment: Support, watchful waiting; Antidepressant or Psycotherapy  15-19= Moderately severe / Treatment: Antidepressant OR Psychotherapy  >= 20 = Major depression, severe / Antidepressant AND Psychotherapy  Functional Status Survey: Is the patient deaf or have difficulty hearing?: Yes Does the patient have difficulty seeing, even when wearing glasses/contacts?: No Does the patient have difficulty concentrating, remembering, or making decisions?: No Does the patient have difficulty walking or climbing stairs?: No Does the patient have difficulty dressing or bathing?: No Does the patient have difficulty doing errands alone such as visiting a doctor's office or shopping?: No  Identification of Risk Factors: Risk factors include: hypertension  ROS  Review of Systems  Constitutional: Negative for activity change, appetite change, chills and fever.  HENT: Negative for congestion, nosebleeds, trouble swallowing and voice change.   Respiratory: Negative for cough, shortness of breath, see hpi  Gastrointestinal: Negative for diarrhea, nausea and vomiting. +bleeding hemorrhoids Genitourinary: Negative for difficulty urinating, dysuria, flank  pain and hematuria.  +chronic urge incontinence Musculoskeletal: Negative for back pain, joint swelling and neck pain.  Neurological: Negative for dizziness, speech difficulty, light-headedness and numbness.  See HPI. All other review of systems negative.    Objective:   Vitals:   05/02/19 0808  BP: (!) 152/90  Pulse: 77  Resp: 18  Temp: (!) 97.1 F (36.2 C)  TempSrc: Temporal  SpO2: 99%  Weight: 171 lb 12.8 oz (77.9 kg)  Height: 5\' 1"  (1.549 m)    Body mass index is 32.46 kg/m.   Physical Exam  Constitutional: Oriented to person, place, and time. Appears well-developed and well-nourished.  HENT:  Head: Normocephalic and atraumatic.  Eyes: Conjunctivae and EOM are normal.  Neck: supple, no thyromegaly Breast: no axillary lymphadenopathy, normal skin, breast symmetric, nontender, no masses Cardiovascular: Normal rate, regular rhythm, normal heart sounds and intact distal pulses.  No murmur heard. Pulmonary/Chest: Effort normal and breath sounds normal. No stridor. No respiratory distress. Has no wheezes.  Abdomen: nondistended, normoactive bs, soft, nontender Neurological: Is alert and oriented to person, place, and time.  Skin: Skin is warm. Capillary refill takes less than 2 seconds.  Psychiatric: Has a normal mood and affect. Behavior is normal. Judgment and thought content normal.     Assessment/Plan:   Patient Self-Management and Personalized Health Advice The patient has been provided with information about:  begin progressive daily aerobic exercise program and reduce salt in diet and cooking  During the course of the visit the patient was educated and counseled about appropriate screening and preventive services including:   lab testing as noted in orders section     Body mass index is 32.46 kg/m. Discussed the patient's BMI with her. The BMI BMI is not in the acceptable range; BMI management plan is completed  Thomas was seen today for annual exam.  Diagnoses and all  orders for this visit:  Encounter for annual wellness exam in Medicare patient -Women's Health Maintenance Plan Advised monthly breast exam and annual mammogram Advised dental exam every six months Discussed stress management Discussed pap smear screening guidelines   Hypertriglyceridemia -     Lipid panel  Hyperglycemia -     Comprehensive metabolic panel  Uncontrolled hypertension-  Discussed that her reaction to Verapamil does not seem to be due to verapamil as her symptoms could be explained by allergic rhinitis, TMJ and environmental allergies.  In fact, the verapamil helped her headaches and hemorrhoids but pt still does not want to take it at this time. She was advised to follow up with Cardiology to evaluate if there are any cardiac cause of wheezing and fluttering of the heart. She has been on multiple drug classes including ace inhibitor, arb, alpha agonist, beta blocker Will make referral urgent as pt has months of uncontrolled hypertension  She could benefit from continued education and reassurance She will establish care with Dr. Pamella Pert after I leave Primary Care at [facility]: Ambulatory referral to Cardiology -     Cancel: Ambulatory referral to Cardiology -     Ambulatory referral to Cardiology      Return in about 3 months (around 08/01/2019) for hypertension with Dr. Pamella Pert.  No future appointments.  Patient Instructions  Health Maintenance for Postmenopausal Women Menopause is a normal process in which your ability to get pregnant comes to an end. This process happens slowly over many months or years, usually between the ages of 59 and 73. Menopause is complete  when you have missed your menstrual periods for 12 months. It is important to talk with your health care provider about some of the most common conditions that affect women after menopause (postmenopausal women). These include heart disease, cancer, and bone loss (osteoporosis). Adopting a  healthy lifestyle and getting preventive care can help to promote your health and wellness. The actions you take can also lower your chances of developing some of these common conditions. What should I know about menopause? During menopause, you may get a number of symptoms, such as:  Hot flashes. These can be moderate or severe.  Night sweats.  Decrease in sex drive.  Mood swings.  Headaches.  Tiredness.  Irritability.  Memory problems.  Insomnia. Choosing to treat or not to treat these symptoms is a decision that you make with your health care provider. Do I need hormone replacement therapy?  Hormone replacement therapy is effective in treating symptoms that are caused by menopause, such as hot flashes and night sweats.  Hormone replacement carries certain risks, especially as you become older. If you are thinking about using estrogen or estrogen with progestin, discuss the benefits and risks with your health care provider. What is my risk for heart disease and stroke? The risk of heart disease, heart attack, and stroke increases as you age. One of the causes may be a change in the body's hormones during menopause. This can affect how your body uses dietary fats, triglycerides, and cholesterol. Heart attack and stroke are medical emergencies. There are many things that you can do to help prevent heart disease and stroke. Watch your blood pressure  High blood pressure causes heart disease and increases the risk of stroke. This is more likely to develop in people who have high blood pressure readings, are of African descent, or are overweight.  Have your blood pressure checked: ? Every 3-5 years if you are 44-41 years of age. ? Every year if you are 3 years old or older. Eat a healthy diet   Eat a diet that includes plenty of vegetables, fruits, low-fat dairy products, and lean protein.  Do not eat a lot of foods that are high in solid fats, added sugars, or sodium. Get  regular exercise Get regular exercise. This is one of the most important things you can do for your health. Most adults should:  Try to exercise for at least 150 minutes each week. The exercise should increase your heart rate and make you sweat (moderate-intensity exercise).  Try to do strengthening exercises at least twice each week. Do these in addition to the moderate-intensity exercise.  Spend less time sitting. Even light physical activity can be beneficial. Other tips  Work with your health care provider to achieve or maintain a healthy weight.  Do not use any products that contain nicotine or tobacco, such as cigarettes, e-cigarettes, and chewing tobacco. If you need help quitting, ask your health care provider.  Know your numbers. Ask your health care provider to check your cholesterol and your blood sugar (glucose). Continue to have your blood tested as directed by your health care provider. Do I need screening for cancer? Depending on your health history and family history, you may need to have cancer screening at different stages of your life. This may include screening for:  Breast cancer.  Cervical cancer.  Lung cancer.  Colorectal cancer. What is my risk for osteoporosis? After menopause, you may be at increased risk for osteoporosis. Osteoporosis is a condition in which bone destruction  happens more quickly than new bone creation. To help prevent osteoporosis or the bone fractures that can happen because of osteoporosis, you may take the following actions:  If you are 29-90 years old, get at least 1,000 mg of calcium and at least 600 mg of vitamin D per day.  If you are older than age 36 but younger than age 66, get at least 1,200 mg of calcium and at least 600 mg of vitamin D per day.  If you are older than age 38, get at least 1,200 mg of calcium and at least 800 mg of vitamin D per day. Smoking and drinking excessive alcohol increase the risk of osteoporosis. Eat  foods that are rich in calcium and vitamin D, and do weight-bearing exercises several times each week as directed by your health care provider. How does menopause affect my mental health? Depression may occur at any age, but it is more common as you become older. Common symptoms of depression include:  Low or sad mood.  Changes in sleep patterns.  Changes in appetite or eating patterns.  Feeling an overall lack of motivation or enjoyment of activities that you previously enjoyed.  Frequent crying spells. Talk with your health care provider if you think that you are experiencing depression. General instructions See your health care provider for regular wellness exams and vaccines. This may include:  Scheduling regular health, dental, and eye exams.  Getting and maintaining your vaccines. These include: ? Influenza vaccine. Get this vaccine each year before the flu season begins. ? Pneumonia vaccine. ? Shingles vaccine. ? Tetanus, diphtheria, and pertussis (Tdap) booster vaccine. Your health care provider may also recommend other immunizations. Tell your health care provider if you have ever been abused or do not feel safe at home. Summary  Menopause is a normal process in which your ability to get pregnant comes to an end.  This condition causes hot flashes, night sweats, decreased interest in sex, mood swings, headaches, or lack of sleep.  Treatment for this condition may include hormone replacement therapy.  Take actions to keep yourself healthy, including exercising regularly, eating a healthy diet, watching your weight, and checking your blood pressure and blood sugar levels.  Get screened for cancer and depression. Make sure that you are up to date with all your vaccines. This information is not intended to replace advice given to you by your health care provider. Make sure you discuss any questions you have with your health care provider. Document Revised: 12/21/2017  Document Reviewed: 12/21/2017 Elsevier Patient Education  [employer].    An after visit summary with all of these plans was given to the patient.

## 2019-05-02 NOTE — Patient Instructions (Signed)

## 2019-05-03 LAB — COMPREHENSIVE METABOLIC PANEL
ALT: 8 IU/L (ref 0–32)
AST: 9 IU/L (ref 0–40)
Albumin/Globulin Ratio: 1.4 (ref 1.2–2.2)
Albumin: 4 g/dL (ref 3.8–4.8)
Alkaline Phosphatase: 71 IU/L (ref 39–117)
BUN/Creatinine Ratio: 12 (ref 12–28)
BUN: 11 mg/dL (ref 8–27)
Bilirubin Total: 0.2 mg/dL (ref 0.0–1.2)
CO2: 22 mmol/L (ref 20–29)
Calcium: 9.4 mg/dL (ref 8.7–10.3)
Chloride: 105 mmol/L (ref 96–106)
Creatinine, Ser: 0.94 mg/dL (ref 0.57–1.00)
GFR calc Af Amer: 72 mL/min/{1.73_m2} (ref >59)
GFR calc non Af Amer: 62 mL/min/{1.73_m2} (ref >59)
Globulin, Total: 2.8 g/dL (ref 1.5–4.5)
Glucose: 91 mg/dL (ref 65–99)
Potassium: 4.1 mmol/L (ref 3.5–5.2)
Sodium: 141 mmol/L (ref 134–144)
Total Protein: 6.8 g/dL (ref 6.0–8.5)

## 2019-05-03 LAB — LIPID PANEL
Chol/HDL Ratio: 5.7 ratio — ABNORMAL HIGH (ref 0.0–4.4)
Cholesterol, Total: 246 mg/dL — ABNORMAL HIGH (ref 100–199)
HDL: 43 mg/dL (ref >39)
LDL Chol Calc (NIH): 158 mg/dL — ABNORMAL HIGH (ref 0–99)
Triglycerides: 244 mg/dL — ABNORMAL HIGH (ref 0–149)
VLDL Cholesterol Cal: 45 mg/dL — ABNORMAL HIGH (ref 5–40)

## 2019-05-08 ENCOUNTER — Other Ambulatory Visit: Payer: Self-pay | Admitting: Oral Surgery

## 2019-05-08 DIAGNOSIS — S0300XA Dislocation of jaw, unspecified side, initial encounter: Secondary | ICD-10-CM

## 2019-05-18 ENCOUNTER — Encounter: Payer: Self-pay | Admitting: General Practice

## 2019-05-22 ENCOUNTER — Encounter: Payer: Self-pay | Admitting: Cardiology

## 2019-05-22 ENCOUNTER — Other Ambulatory Visit: Payer: Self-pay

## 2019-05-22 ENCOUNTER — Ambulatory Visit: Payer: Medicare Other | Admitting: Cardiology

## 2019-05-22 VITALS — BP 162/94 | HR 82 | Temp 96.4°F | Resp 16 | Ht 61.0 in | Wt 173.0 lb

## 2019-05-22 DIAGNOSIS — E785 Hyperlipidemia, unspecified: Secondary | ICD-10-CM

## 2019-05-22 DIAGNOSIS — I10 Essential (primary) hypertension: Secondary | ICD-10-CM | POA: Diagnosis not present

## 2019-05-22 DIAGNOSIS — Z87891 Personal history of nicotine dependence: Secondary | ICD-10-CM

## 2019-05-22 DIAGNOSIS — Z6830 Body mass index (BMI) 30.0-30.9, adult: Secondary | ICD-10-CM

## 2019-05-22 MED ORDER — HYDROCHLOROTHIAZIDE 12.5 MG PO CAPS: 12.5000 mg | ORAL_CAPSULE | Freq: Every day | ORAL | 2 refills | Status: DC

## 2019-05-22 NOTE — Progress Notes (Signed)
Date:  05/22/2019   ID:  [name], DOB [birth year], MRN [id number]  PCP:  Forrest Moron, MD  Cardiologist:  Rex Kras, DO, [facility] (established care 05/22/2019)  REASON FOR CONSULT: Uncontrolled hypertension  REQUESTING PHYSICIAN:  Forrest Moron, MD [place],  [place] [address]  Chief Complaint  Patient presents with  . Hypertension    Uncontrolled  . New Patient (Initial Visit)    Referred by Dr. Delia Chimes    HPI  [name] is a 69 y.o. female who is being seen today for the evaluation of uncontrolled hypertension at the request of Forrest Moron, MD. Patient's past medical history and cardiac risk factors include: Hypertension, hyperlipidemia, IBS, former smoker, postmenopausal female, obesity due to excess calories.  Patient is referred to the office at the request of her primary care provider for treatment of high blood pressure.  Patient states that she was diagnosed with hypertension back in the early 1990s and since then she has been on multiple antihypertensive medications which she has been intolerant to. She states that she does not recall the names the medication and the fact that they are in epic.  Review of prior medications illustrates that she has been on several antihypertensive medications in the past: Verapamil: Discontinued secondary to joint pain specifically in the jaw. Amlodipine: Discontinued secondary to feeling tired. Hydrochlorothiazide: Discontinued secondary to low potassium. Lisinopril discontinued secondary to cough. She has also been on losartan, Toprol-XL, propranolol, Aldactone, and they have been discontinued secondary to feeling tired and joint pain.  Patient feels tired and fatigued throughout the day.  She states that her husband notes that she breathes funny while she is sleeping.  She also snores.  She has not been evaluated for sleep obstructive sleep apnea.  Patient states that when she checks her blood  pressures at home the systolic blood pressure ranges between [id number] mmHg and diastolic blood pressure are in 80 mmHg.  ALLERGIES: Allergies  Allergen Reactions  . Aspirin Shortness Of Breath  . Pollen Extract     Skin itching, runny eyes, sinus pressure  . Sulfa Antibiotics Itching    itching    MEDICATION LIST PRIOR TO VISIT: Current Meds  Medication Sig  . cyclobenzaprine (FLEXERIL) 10 MG tablet Take 10 mg by mouth once. Once daily at bedtime   Current Facility-Administered Medications for the 05/22/19 encounter (Office Visit) with Rex Kras, DO  Medication  . lidocaine-EPINEPHrine (XYLOCAINE W/EPI) 2 %-1:100000 (with pres) injection 3 mL     PAST MEDICAL HISTORY: Past Medical History:  Diagnosis Date  . Allergy    Zyrtec PRN  . Anemia   . Cataract   . Colon polyp 03/11/2007  . Diverticulosis    by colonoscopy 2009  . Fibromyalgia    diagnosed by Dr. Teressa Lower. s/p rheumatology consultation  . GERD (gastroesophageal reflux disease)   . Hyperlipidemia   . Hypertension   . IBS (irritable bowel syndrome)    diarrhea, constipation alternating; s/p GI consult.  . Internal hemorrhoid    colonoscopy in 2009    PAST SURGICAL HISTORY: Past Surgical History:  Procedure Laterality Date  . CESAREAN SECTION    . COLONOSCOPY    . ENDOVENOUS ABLATION SAPHENOUS VEIN W/ LASER Right 08/17/2016   endovenous laser ablation right greater saphenous vein by Tinnie Gens MD   . MYOMECTOMY    . TUBAL LIGATION    . UPPER GASTROINTESTINAL ENDOSCOPY      FAMILY HISTORY: The  patient family history includes Cancer in her father; Glaucoma in her sister; Heart disease (age of onset: 50) in her mother; Hyperlipidemia in her sister; Hypertension in her brother, maternal grandfather, maternal grandmother, mother, sister, and sister; Prostatitis in her brother; Stroke in her mother.  SOCIAL HISTORY:  The patient  reports that she quit smoking about 24 years ago. She has a 54.00 pack-year  smoking history. She has never used smokeless tobacco. She reports that she does not drink alcohol or use drugs.  REVIEW OF SYSTEMS: Review of Systems  Constitution: Positive for malaise/fatigue. Negative for chills and fever.  HENT: Negative for hoarse voice and nosebleeds.   Eyes: Negative for discharge, double vision and pain.  Cardiovascular: Negative for chest pain, claudication, dyspnea on exertion, leg swelling, near-syncope, orthopnea, palpitations, paroxysmal nocturnal dyspnea and syncope.  Respiratory: Positive for snoring. Negative for hemoptysis and shortness of breath.   Musculoskeletal: Positive for joint pain. Negative for muscle cramps and myalgias.  Gastrointestinal: Positive for heartburn. Negative for abdominal pain, constipation, diarrhea, hematemesis, hematochezia, melena, nausea and vomiting.  Neurological: Negative for dizziness and light-headedness.    PHYSICAL EXAM: Vitals with BMI 05/22/2019 05/02/2019 04/03/2019  Height 5\' 1"  5\' 1"  -  Weight 173 lbs 171 lbs 13 oz -  BMI [id number] -  Systolic 0000000 0000000 [id number]  Diastolic 94 90 81  Pulse 82 77 68    CONSTITUTIONAL: Well-developed and well-nourished. No acute distress.  SKIN: Skin is warm and dry. No rash noted. No cyanosis. No pallor. No jaundice HEAD: Normocephalic and atraumatic.  EYES: No scleral icterus MOUTH/THROAT: Moist oral membranes.  NECK: No JVD present. No thyromegaly noted. No carotid bruits  LYMPHATIC: No visible cervical adenopathy.  CHEST Normal respiratory effort. No intercostal retractions  LUNGS: Clear to auscultation bilaterally.  No stridor. No wheezes. No rales.  CARDIOVASCULAR: Regular rate and rhythm, positive S1-S2, no murmurs rubs or gallops appreciated ABDOMINAL: No apparent ascites.  EXTREMITIES: No peripheral edema  HEMATOLOGIC: No significant bruising NEUROLOGIC: Oriented to person, place, and time. Nonfocal. Normal muscle tone.  PSYCHIATRIC: Normal mood and affect. Normal behavior.  Cooperative  CARDIAC DATABASE: EKG: 05/22/2019: Normal sinus rhythm, 65 bpm, normal axis, T wave inversions in anterior precordial leads, without underlying injury pattern.   Echocardiogram: None  Stress Testing: MPI 04/04/2015: Small size and intensity fixed basal septal artifact. No reversible ischemia. LVEF 71% with normal wall motion. This is a low risk study. Reassuring  Heart Catheterization: None   LABORATORY DATA: CBC Latest Ref Rng & Units 07/27/2017 06/09/2017 05/25/2017  WBC 3.4 - 10.8 x10E3/uL 3.5 6.3 3.7  Hemoglobin 11.1 - 15.9 g/dL 14.0 14.7 14.4  Hematocrit 34.0 - 46.6 % 41.9 44.0 42.6  Platelets 150 - 450 x10E3/uL 165 188 153    CMP Latest Ref Rng & Units 05/02/2019 08/16/2018 03/06/2018  Glucose 65 - 99 mg/dL 91 102(H) 100(H)  BUN 8 - 27 mg/dL 11 10 11   Creatinine 0.57 - 1.00 mg/dL 0.94 1.02(H) 1.07(H)  Sodium 134 - 144 mmol/L 141 141 143  Potassium 3.5 - 5.2 mmol/L 4.1 4.1 3.9  Chloride 96 - 106 mmol/L 105 108(H) 108(H)  CO2 20 - 29 mmol/L 22 20 21   Calcium 8.7 - 10.3 mg/dL 9.4 9.0 8.8  Total Protein 6.0 - 8.5 g/dL 6.8 6.5 6.5  Total Bilirubin 0.0 - 1.2 mg/dL 0.2 0.3 0.4  Alkaline Phos 39 - 117 IU/L 71 64 58  AST 0 - 40 IU/L 9 14 10   ALT 0 - 32  IU/L 8 8 10     Lipid Panel     Component Value Date/Time   CHOL 246 (H) 05/02/2019 0936   TRIG 244 (H) 05/02/2019 0936   HDL 43 05/02/2019 0936   CHOLHDL 5.7 (H) 05/02/2019 0936   CHOLHDL 3.9 07/22/2015 1356   VLDL 24 07/22/2015 1356   LDLCALC 158 (H) 05/02/2019 0936   LABVLDL 45 (H) 05/02/2019 0936    Lab Results  Component Value Date   HGBA1C 5.6 07/27/2017   HGBA1C 5.5 07/12/2016   HGBA1C 5.5 07/22/2015   No components found for: NTPROBNP Lab Results  Component Value Date   TSH 1.540 07/27/2017   TSH 2.920 07/12/2016   TSH 1.055 12/04/2013    BMP Recent Labs    08/16/18 1137 05/02/19 0936  NA 141 141  K 4.1 4.1  CL 108* 105  CO2 20 22  GLUCOSE 102* 91  BUN 10 11  CREATININE 1.02* 0.94    CALCIUM 9.0 9.4  GFRNONAA 56* 62  GFRAA 65 72   HEMOGLOBIN A1C Lab Results  Component Value Date   HGBA1C 5.6 07/27/2017   MPG 111 07/22/2015    IMPRESSION:    ICD-10-CM   1. Essential hypertension  I10 EKG 12-Lead    hydrochlorothiazide (MICROZIDE) 12.5 MG capsule    Basic metabolic panel    Magnesium    PCV ECHOCARDIOGRAM COMPLETE  2. Dyslipidemia  E78.5   3. Class 1 obesity due to excess calories with serious comorbidity and body mass index (BMI) of 30.0 to 30.9 in adult  E66.09    Z68.30   4. Former smoker  Z87.891      RECOMMENDATIONS: [name] is a 69 y.o. female whose past medical history and cardiac risk factors include: Hypertension, hyperlipidemia, IBS, former smoker, postmenopausal female, obesity due to excess calories.  Benign essential hypertension:  Patient's blood pressure is not well controlled.  She has been on multiple antihypertensive medications in the past as per HPI and they have been discontinued for various reasons mostly secondary to joint pain and feeling tired.  I educated the patient extensively that these medications are very effective in improving blood pressures.  She was also educated on the effects of uncontrolled blood pressure leading to other systemic illnesses such as but not including hypertensive heart disease, cardiomyopathy, worsening chronic kidney disease leading to dialysis, etc.  Patient is willing to retry hydrochlorothiazide 12.5 mg p.o. every morning.  We will check labs 1 week to reevaluate kidney function and electrolytes.  Echocardiogram will be ordered to evaluate for structural heart disease and left ventricular systolic function.  Patient is asked to keep a log of her blood pressures and to bring it in the next visit.  If they are consistently greater than 140 mmHg she is asked to call the office sooner for further medication titration.  Low-salt diet recommended.  Also recommended that she follows up with  her primary for possible sleep evaluation to be screened for obstructive sleep apnea as clinical suspicion is high.  Patient may also benefit from possible rheumatology evaluation given her joint pain.  Will defer to primary team.  Dyslipidemia: Currently not on statin therapy.  Managed by primary team.  Former smoker: Educated on importance of continued smoking cessation.  FINAL MEDICATION LIST END OF ENCOUNTER: Meds ordered this encounter  Medications  . hydrochlorothiazide (MICROZIDE) 12.5 MG capsule    Sig: Take 1 capsule (12.5 mg total) by mouth daily.    Dispense:  30 capsule  Refill:  2     Current Outpatient Medications:  .  cyclobenzaprine (FLEXERIL) 10 MG tablet, Take 10 mg by mouth once. Once daily at bedtime, Disp: , Rfl:  .  gabapentin (NEURONTIN) 100 MG capsule, Take 1 capsule (100 mg total) by mouth at bedtime. (Patient not taking: Reported on 05/22/2019), Disp: 30 capsule, Rfl: 3 .  hydrochlorothiazide (MICROZIDE) 12.5 MG capsule, Take 1 capsule (12.5 mg total) by mouth daily., Disp: 30 capsule, Rfl: 2  Current Facility-Administered Medications:  .  lidocaine-EPINEPHrine (XYLOCAINE W/EPI) 2 %-1:100000 (with pres) injection 3 mL, 3 mL, Intradermal, Once, Stallings, Zoe A, MD  Orders Placed This Encounter  Procedures  . Basic metabolic panel  . Magnesium  . EKG 12-Lead  . PCV ECHOCARDIOGRAM COMPLETE    There are no Patient Instructions on file for this visit.   --Continue cardiac medications as reconciled in final medication list. --Return in about 4 weeks (around 06/19/2019) for BP follow up.. Or sooner if needed. --Continue follow-up with your primary care physician regarding the management of your other chronic comorbid conditions.  Patient's questions and concerns were addressed to her satisfaction. She voices understanding of the instructions provided during this encounter.   This note was created using a voice recognition software as a result there may be  grammatical errors inadvertently enclosed that do not reflect the nature of this encounter. Every attempt is made to correct such errors.  Rex Kras, [place], [facility]  Pager: [phone] Office: [phone]

## 2019-05-23 ENCOUNTER — Other Ambulatory Visit: Payer: Self-pay

## 2019-05-23 DIAGNOSIS — I10 Essential (primary) hypertension: Secondary | ICD-10-CM

## 2019-05-25 ENCOUNTER — Ambulatory Visit: Payer: Medicare Other

## 2019-05-25 ENCOUNTER — Other Ambulatory Visit: Payer: Self-pay

## 2019-05-25 DIAGNOSIS — I10 Essential (primary) hypertension: Secondary | ICD-10-CM | POA: Diagnosis not present

## 2019-06-08 NOTE — Progress Notes (Signed)
Lvm for pt to call back. 

## 2019-06-12 ENCOUNTER — Telehealth: Payer: Self-pay

## 2019-06-12 NOTE — Telephone Encounter (Signed)
Pt returned your call and made aware of echo results.//ah

## 2019-06-13 DIAGNOSIS — I10 Essential (primary) hypertension: Secondary | ICD-10-CM | POA: Diagnosis not present

## 2019-06-14 LAB — BASIC METABOLIC PANEL
BUN/Creatinine Ratio: 12 (ref 12–28)
BUN: 12 mg/dL (ref 8–27)
CO2: 27 mmol/L (ref 20–29)
Calcium: 9.6 mg/dL (ref 8.7–10.3)
Chloride: 99 mmol/L (ref 96–106)
Creatinine, Ser: 0.98 mg/dL (ref 0.57–1.00)
GFR calc Af Amer: 68 mL/min/{1.73_m2} (ref >59)
GFR calc non Af Amer: 59 mL/min/{1.73_m2} — ABNORMAL LOW (ref >59)
Glucose: 121 mg/dL — ABNORMAL HIGH (ref 65–99)
Potassium: 3.8 mmol/L (ref 3.5–5.2)
Sodium: 141 mmol/L (ref 134–144)

## 2019-06-14 LAB — MAGNESIUM: Magnesium: 2 mg/dL (ref 1.6–2.3)

## 2019-06-19 ENCOUNTER — Ambulatory Visit: Payer: Medicare Other | Admitting: Cardiology

## 2019-06-29 ENCOUNTER — Ambulatory Visit: Payer: Medicare Other | Admitting: Cardiology

## 2019-06-29 ENCOUNTER — Other Ambulatory Visit: Payer: Self-pay

## 2019-06-29 ENCOUNTER — Encounter: Payer: Self-pay | Admitting: Cardiology

## 2019-06-29 VITALS — BP 169/91 | HR 72 | Ht 61.0 in | Wt 172.0 lb

## 2019-06-29 DIAGNOSIS — E782 Mixed hyperlipidemia: Secondary | ICD-10-CM

## 2019-06-29 DIAGNOSIS — Z6830 Body mass index (BMI) 30.0-30.9, adult: Secondary | ICD-10-CM

## 2019-06-29 DIAGNOSIS — I10 Essential (primary) hypertension: Secondary | ICD-10-CM

## 2019-06-29 MED ORDER — NEBIVOLOL HCL 5 MG PO TABS: 10.0000 mg | ORAL_TABLET | Freq: Every day | ORAL | Status: DC

## 2019-06-29 NOTE — Progress Notes (Signed)
Primary Physician/Referring:  Forrest Moron, MD  Patient ID: [name], female    DOB: [birth year], 69 y.o.   MRN: [id number]  Chief Complaint  Patient presents with  . Follow-up    4 week  . Hypertension   HPI:    [name]  is a 69 y.o. who is being seen today for the evaluation for difficult to control hypertension at the request of Forrest Moron, MD. Patient's past medical history and cardiac risk factors include: Hypertension, hyperlipidemia, IBS, former smoker, postmenopausal female, obesity due to excess calories. Patient states that she was diagnosed with hypertension back in the early 1990s and since then she has been on multiple antihypertensive medications which she has been intolerant to.  She was started on HCTZ on her last OV and now states she has started to itch. No rash. No chest pain and dyspnea is minimal and stable.   Review of prior medications illustrates that she has been on several antihypertensive medications in the past: Verapamil: Discontinued secondary to joint pain specifically in the jaw. Amlodipine: Discontinued secondary to feeling tired. Hydrochlorothiazide: Discontinued secondary to low potassium. Lisinopril discontinued secondary to cough. Losartan, Toprol-XL, propranolol, Aldactone, and they have been discontinued secondary to feeling tired and joint pain.  Past Medical History:  Diagnosis Date  . Allergy    Zyrtec PRN  . Anemia   . Cataract   . Colon polyp 03/11/2007  . Diverticulosis    by colonoscopy 2009  . Fibromyalgia    diagnosed by Dr. Teressa Lower. s/p rheumatology consultation  . GERD (gastroesophageal reflux disease)   . Hyperlipidemia   . Hypertension   . IBS (irritable bowel syndrome)    diarrhea, constipation alternating; s/p GI consult.  . Internal hemorrhoid    colonoscopy in 2009   Past Surgical History:  Procedure Laterality Date  . CESAREAN SECTION    . COLONOSCOPY    . ENDOVENOUS ABLATION  SAPHENOUS VEIN W/ LASER Right 08/17/2016   endovenous laser ablation right greater saphenous vein by Tinnie Gens MD   . MYOMECTOMY    . TUBAL LIGATION    . UPPER GASTROINTESTINAL ENDOSCOPY     Family History  Problem Relation Age of Onset  . Hypertension Mother   . Stroke Mother        multiple CVAs/TIAs  . Heart disease Mother 62       CHF  . Cancer Father        prostate cancer  . Hypertension Sister   . Hyperlipidemia Sister   . Glaucoma Sister   . Hypertension Brother   . Prostatitis Brother   . Hypertension Maternal Grandmother   . Hypertension Maternal Grandfather   . Hypertension Sister   . Esophageal cancer Neg Hx   . Rectal cancer Neg Hx   . Stomach cancer Neg Hx     Social History   Tobacco Use  . Smoking status: Former Smoker    Packs/day: 2.00    Years: 27.00    Pack years: 54.00    Quit date: 10/25/1994    Years since quitting: 24.6  . Smokeless tobacco: Never Used  Substance Use Topics  . Alcohol use: No    Alcohol/week: 0.0 standard drinks   Marital Status: Married  ROS  Review of Systems  Cardiovascular: Positive for dyspnea on exertion. Negative for chest pain and leg swelling.  Respiratory: Positive for snoring.   Gastrointestinal: Negative for melena.   Objective  Blood pressure (!) 169/91,  pulse 72, height 5\' 1"  (1.549 m), weight 172 lb (78 kg), SpO2 97 %.  Vitals with BMI 06/29/2019 06/29/2019 05/22/2019  Height - 5\' 1"  5\' 1"   Weight - 172 lbs 173 lbs  BMI - 78.58 85.0  Systolic 277 412 878  Diastolic 91 97 94  Pulse - 72 82     Physical Exam Constitutional:      Comments: Mildly obese. No acute distress.  Cardiovascular:     Rate and Rhythm: Normal rate and regular rhythm.     Pulses: Intact distal pulses.     Heart sounds: Normal heart sounds. No murmur heard.  No gallop.      Comments: No leg edema, no JVD. Pulmonary:     Effort: Pulmonary effort is normal.     Breath sounds: Normal breath sounds.  Abdominal:     General:  Bowel sounds are normal.     Palpations: Abdomen is soft.    Laboratory examination:   Recent Labs    08/16/18 1137 05/02/19 0936 06/13/19 1152  NA 141 141 141  K 4.1 4.1 3.8  CL 108* 105 99  CO2 20 22 27   GLUCOSE 102* 91 121*  BUN 10 11 12   CREATININE 1.02* 0.94 0.98  CALCIUM 9.0 9.4 9.6  GFRNONAA 56* 62 59*  GFRAA 65 72 68   estimated creatinine clearance is 51.2 mL/min (by C-G formula based on SCr of 0.98 mg/dL).  CMP Latest Ref Rng & Units 06/13/2019 05/02/2019 08/16/2018  Glucose 65 - 99 mg/dL 121(H) 91 102(H)  BUN 8 - 27 mg/dL 12 11 10   Creatinine 0.57 - 1.00 mg/dL 0.98 0.94 1.02(H)  Sodium 134 - 144 mmol/L 141 141 141  Potassium 3.5 - 5.2 mmol/L 3.8 4.1 4.1  Chloride 96 - 106 mmol/L 99 105 108(H)  CO2 20 - 29 mmol/L 27 22 20   Calcium 8.7 - 10.3 mg/dL 9.6 9.4 9.0  Total Protein 6.0 - 8.5 g/dL - 6.8 6.5  Total Bilirubin 0.0 - 1.2 mg/dL - 0.2 0.3  Alkaline Phos 39 - 117 IU/L - 71 64  AST 0 - 40 IU/L - 9 14  ALT 0 - 32 IU/L - 8 8   CBC Latest Ref Rng & Units 07/27/2017 06/09/2017 05/25/2017  WBC 3.4 - 10.8 x10E3/uL 3.5 6.3 3.7  Hemoglobin 11.1 - 15.9 g/dL 14.0 14.7 14.4  Hematocrit 34.0 - 46.6 % 41.9 44.0 42.6  Platelets 150 - 450 x10E3/uL 165 188 153   Lipid Panel    Component Value Date/Time   CHOL 246 (H) 05/02/2019 0936   TRIG 244 (H) 05/02/2019 0936   HDL 43 05/02/2019 0936   CHOLHDL 5.7 (H) 05/02/2019 0936   CHOLHDL 3.9 07/22/2015 1356   VLDL 24 07/22/2015 1356   LDLCALC 158 (H) 05/02/2019 0936    HEMOGLOBIN A1C Lab Results  Component Value Date   HGBA1C 5.6 07/27/2017   MPG 111 07/22/2015   TSH No results for input(s): TSH in the last 8760 hours.  Medications and allergies   Allergies  Allergen Reactions  . Aspirin Shortness Of Breath  . Pollen Extract     Skin itching, runny eyes, sinus pressure  . Sulfa Antibiotics Itching    itching     Current Outpatient Medications  Medication Instructions  . hydrochlorothiazide (MICROZIDE) 12.5 mg,  Oral, Daily    Medications Discontinued During This Encounter  Medication Reason  . cyclobenzaprine (FLEXERIL) 10 MG tablet Completed Course  . lidocaine-EPINEPHrine (XYLOCAINE W/EPI) 2 %-1:100000 (with pres)  injection 3 mL   . gabapentin (NEURONTIN) 100 MG capsule Patient has not taken in last 30 days    Radiology:   No results found.  Cardiac Studies:   Stress Testing MPI 04/04/2015: Small size and intensity fixed basal septal artifact. No reversible ischemia. LVEF 71% with normal wall motion. This is a low risk study.  Echocardiogram 05/25/2019:  Left ventricle cavity is normal in size. Moderate concentric hypertrophy  of the left ventricle. Normal global wall motion. Normal LV systolic  function with EF 59%. Doppler evidence of grade I (impaired) diastolic  dysfunction, normal LAP.  Left atrial cavity is normal in size. Aneurysmal interatrial septum  without 2D or color Doppler evidence of interatrial shunt.  Mild tricuspid regurgitation. Estimated pulmonary artery systolic pressure  is 22 mmHg.  EKG: 05/22/2019: Normal sinus rhythm, 65 bpm, normal axis, T wave inversions in anterior precordial leads, without underlying injury pattern.   Assessment     ICD-10-CM   1. Essential hypertension  I10 nebivolol (BYSTOLIC) tablet 10 mg  2. Mixed hyperlipidemia  E78.2   3. Class 1 obesity due to excess calories with serious comorbidity and body mass index (BMI) of 30.0 to 30.9 in adult  E66.09    Z68.30     Recommendations:   [name]  is a 69 y.o. who is being seen today for the evaluation for difficult to control hypertension at the request of Forrest Moron, MD. Patient's past medical history and cardiac risk factors include: Hypertension, hyperlipidemia, IBS, former smoker, postmenopausal female, obesity due to excess calories. Patient states that she was diagnosed with hypertension back in the early 1990s and since then she has been on multiple antihypertensive  medications which she has been intolerant to.   (Medication intolerances: Verapamil, amlodipine, due to joint pain and feeling tired.  HCTZ due to low potassium.  Lisinopril secondary to cough.  Losartan, Toprol-XL, propranolol, Aldactone due to fatigue).  On her last office visit 4 weeks ago, patient was started on HCTZ, states that she has developed itching, but she also endorses multiple allergies.  Advised her that her itching could be nonmedication related but advised her to discontinue the medication for 1 to 2 weeks and if the symptoms resolve to restart back to rechallenge herself with HCTZ as it is an important drug.  I have given her samples of Bystolic 10 mg daily for hypertension.  She clearly has symptoms suggestive of obstructive sleep apnea, she needs to be evaluated further for the same.  But having said this, patient states that she is wanting to make lifestyle changes with regard to weight loss and joining a weight watchers program which will certainly help with control of blood pressure and also potentially sleep apnea.  I like to see her back in 2-3 for follow-up of hypertension.  With regard to hyperlipidemia, she has been intolerant to many drugs in the past, would like to situate her first with antihypertensive medications prior to targeting her hyperlipidemia.   Adrian Prows, MD, [facility] 07/01/2019, 3:07 PM Office: [phone]

## 2019-07-23 ENCOUNTER — Ambulatory Visit: Payer: Medicare Other | Admitting: Cardiology

## 2019-08-02 ENCOUNTER — Encounter: Payer: Self-pay | Admitting: Family Medicine

## 2019-08-02 ENCOUNTER — Ambulatory Visit (INDEPENDENT_AMBULATORY_CARE_PROVIDER_SITE_OTHER): Payer: Medicare Other | Admitting: Family Medicine

## 2019-08-02 ENCOUNTER — Other Ambulatory Visit: Payer: Self-pay

## 2019-08-02 VITALS — BP 130/78 | HR 59 | Temp 97.1°F | Ht 61.0 in | Wt 170.0 lb

## 2019-08-02 DIAGNOSIS — G8929 Other chronic pain: Secondary | ICD-10-CM

## 2019-08-02 DIAGNOSIS — M545 Low back pain, unspecified: Secondary | ICD-10-CM

## 2019-08-02 DIAGNOSIS — F411 Generalized anxiety disorder: Secondary | ICD-10-CM | POA: Diagnosis not present

## 2019-08-02 DIAGNOSIS — I10 Essential (primary) hypertension: Secondary | ICD-10-CM

## 2019-08-02 DIAGNOSIS — R252 Cramp and spasm: Secondary | ICD-10-CM

## 2019-08-02 MED ORDER — FLUOXETINE HCL 20 MG PO CAPS: 20.0000 mg | ORAL_CAPSULE | Freq: Every day | ORAL | 3 refills | Status: DC

## 2019-08-02 NOTE — Progress Notes (Signed)
7/22/20211:55 PM  [name] 02/22/1950, 69 y.o., female [id number]  Chief Complaint  Patient presents with  . Hypertension    took 3 week trial of bystolic / w/ intermittent hctz for leg swelling   . anxiety and stress    gad 7 - 12  . Referral    to rheum states bp meds caus more aches- hip pain     HPI:   Patient is a 69 y.o. female with past medical history significant for HTN, prediabetes, IBS-D, GERD, HLP, UI, osteopenia  who presents today for several concens  Patient last seen by cards June 2021 who manages her BP as she has had multiple side effects to medications - itchiness 2/2 HCTZ maybe? Trial of bystolic  She reports that she returned back on HCTZ as she ran out of bystolic samples  She gets knots painful bilateral low back/post hip She reports that her side hips also hurt, Left is worse Pain radiates down both legs (sometimes along IT band) and sometimes down anterior thigh, post calf She also reports that she had issues with muscle cramping on HCTZ Last week she was having muscle cramping  Lumbar spine xray 2019:  Mild S-shaped lower thoracic and lumbar scoliotic curvature is stable IMPRESSION: 1.  No acute osseous abnormality identified in the lumbar spine. 2. 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 3. 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 4. Mild grade 1 anterolisthesis at L3-L4 with moderate facet Degeneration.  Right hip xray 2019:  Normal   Recently has had increase in anxiety Sign stressors in life, postmenopause She has done well on prozac in the past  GAD 7 : Generalized Anxiety Score 08/02/2019  Nervous, Anxious, on Edge 1  Control/stop worrying 2  Worry too much - different things 2  Trouble relaxing 2  Restless 2  Easily annoyed or irritable 2  Afraid - awful might happen 1  Total GAD 7 Score 12  Anxiety Difficulty Somewhat difficult    Depression screen [facility] 2/9  08/02/2019 05/02/2019 02/14/2019  Decreased Interest 0 0 0  Down, Depressed, Hopeless 0 0 0  PHQ - 2 Score 0 0 0  Altered sleeping - - -  Tired, decreased energy - - -  Change in appetite - - -  Feeling bad or failure about yourself  - - -  Trouble concentrating - - -  Moving slowly or fidgety/restless - - -  Suicidal thoughts - - -  PHQ-9 Score - - -    Fall Risk  08/02/2019 05/02/2019 02/14/2019 11/27/2018 08/16/2018  Falls in the past year? 0 1 1 0 0  Number falls in past yr: 0 0 0 0 0  Comment - - per pt she caught herself - -  Injury with Fall? 0 0 0 0 0  Follow up Falls evaluation completed Falls evaluation completed Falls evaluation completed - Education provided;Falls prevention discussed;Falls evaluation completed     Allergies  Allergen Reactions  . Aspirin Shortness Of Breath  . Pollen Extract     Skin itching, runny eyes, sinus pressure  . Sulfa Antibiotics Itching    itching    Prior to Admission medications   Medication Sig Start Date End Date Taking? Authorizing Provider  hydrochlorothiazide (MICROZIDE) 12.5 MG capsule Take 1 capsule (12.5 mg total) by mouth daily. 05/22/19 08/20/19 Yes Rex Kras, DO    Past Medical History:  Diagnosis Date  . Allergy    Zyrtec PRN  .  Anemia   . Cataract   . Colon polyp 03/11/2007  . Diverticulosis    by colonoscopy 2009  . Fibromyalgia    diagnosed by Dr. Teressa Lower. s/p rheumatology consultation  . GERD (gastroesophageal reflux disease)   . Hyperlipidemia   . Hypertension   . IBS (irritable bowel syndrome)    diarrhea, constipation alternating; s/p GI consult.  . Internal hemorrhoid    colonoscopy in 2009    Past Surgical History:  Procedure Laterality Date  . CESAREAN SECTION    . COLONOSCOPY    . ENDOVENOUS ABLATION SAPHENOUS VEIN W/ LASER Right 08/17/2016   endovenous laser ablation right greater saphenous vein by Tinnie Gens MD   . MYOMECTOMY    . TUBAL LIGATION    . UPPER GASTROINTESTINAL ENDOSCOPY       Social History   Tobacco Use  . Smoking status: Former Smoker    Packs/day: 2.00    Years: 27.00    Pack years: 54.00    Quit date: 10/25/1994    Years since quitting: 24.7  . Smokeless tobacco: Never Used  Substance Use Topics  . Alcohol use: No    Alcohol/week: 0.0 standard drinks    Family History  Problem Relation Age of Onset  . Hypertension Mother   . Stroke Mother        multiple CVAs/TIAs  . Heart disease Mother 74       CHF  . Cancer Father        prostate cancer  . Hypertension Sister   . Hyperlipidemia Sister   . Glaucoma Sister   . Hypertension Brother   . Prostatitis Brother   . Hypertension Maternal Grandmother   . Hypertension Maternal Grandfather   . Hypertension Sister   . Esophageal cancer Neg Hx   . Rectal cancer Neg Hx   . Stomach cancer Neg Hx     Review of Systems  Constitutional: Negative for chills and fever.  Respiratory: Negative for cough and shortness of breath.   Cardiovascular: Negative for chest pain, palpitations and leg swelling.  Gastrointestinal: Negative for abdominal pain, nausea and vomiting.     OBJECTIVE:  Today's Vitals   08/02/19 1331  BP: (!) 130/78  Pulse: 59  Temp: (!) 97.1 F (36.2 C)  SpO2: 98%  Weight: 170 lb (77.1 kg)  Height: 5\' 1"  (1.549 m)   Body mass index is 32.12 kg/m.   Physical Exam Vitals and nursing note reviewed.  Constitutional:      Appearance: She is well-developed.  HENT:     Head: Normocephalic and atraumatic.     Mouth/Throat:     Pharynx: No oropharyngeal exudate.  Eyes:     General: No scleral icterus.    Conjunctiva/sclera: Conjunctivae normal.     Pupils: Pupils are equal, round, and reactive to light.  Cardiovascular:     Rate and Rhythm: Normal rate and regular rhythm.     Heart sounds: Normal heart sounds. No murmur heard.  No friction rub. No gallop.   Pulmonary:     Effort: Pulmonary effort is normal.     Breath sounds: Normal breath sounds. No wheezing,  rhonchi or rales.  Musculoskeletal:     Cervical back: Neck supple.     Lumbar back: Tenderness (right lower paraspinals, Right GM tendon) present. No bony tenderness. Negative right straight leg raise test and negative left straight leg raise test. Scoliosis present.     Right hip: No tenderness. Normal range of motion. Normal strength.  Left hip: No tenderness. Normal range of motion. Normal strength.     Right lower leg: No edema.     Left lower leg: No edema.  Skin:    General: Skin is warm and dry.  Neurological:     Mental Status: She is alert and oriented to person, place, and time.     No results found for this or any previous visit (from the past 24 hour(s)).  No results found.   ASSESSMENT and PLAN  1. Chronic right-sided low back pain without sciatica - Ambulatory referral to Physical Therapy  2. Muscle cramps Discussed supportive measures - Basic Metabolic Panel - Magnesium  3. Essential hypertension Controlled. Continue current regime.   4. GAD (generalized anxiety disorder) Started prozac, reviewed r/se/b, declines counseling at this time. Discussed local parks near by for walking.  Other order  - FLUoxetine (PROZAC) 20 MG capsule; Take 1 capsule (20 mg total) by mouth daily.  Return in about 3 months (around 11/02/2019).     Rutherford Guys, MD [facility] [place], [place] [address] Ph.  [phone] Fax [phone]

## 2019-08-02 NOTE — Patient Instructions (Signed)
° ° ° °  If you have lab work done today you will be contacted with your lab results within the next 2 weeks.  If you have not heard from us then please contact us. The fastest way to get your results is to register for My Chart. ° ° °IF you received an x-ray today, you will receive an invoice from [facility]. Please contact [facility] at [phone] with questions or concerns regarding your invoice.  ° °IF you received labwork today, you will receive an invoice from [facility]. Please contact [facility] at [phone] with questions or concerns regarding your invoice.  ° °Our billing staff will not be able to assist you with questions regarding bills from these companies. ° °You will be contacted with the lab results as soon as they are available. The fastest way to get your results is to activate your My Chart account. Instructions are located on the last page of this paperwork. If you have not heard from us regarding the results in 2 weeks, please contact this office. °  ° ° ° °

## 2019-08-03 LAB — BASIC METABOLIC PANEL
BUN/Creatinine Ratio: 11 — ABNORMAL LOW (ref 12–28)
BUN: 11 mg/dL (ref 8–27)
CO2: 24 mmol/L (ref 20–29)
Calcium: 9.6 mg/dL (ref 8.7–10.3)
Chloride: 101 mmol/L (ref 96–106)
Creatinine, Ser: 1.01 mg/dL — ABNORMAL HIGH (ref 0.57–1.00)
GFR calc Af Amer: 65 mL/min/{1.73_m2} (ref >59)
GFR calc non Af Amer: 57 mL/min/{1.73_m2} — ABNORMAL LOW (ref >59)
Glucose: 110 mg/dL — ABNORMAL HIGH (ref 65–99)
Potassium: 3.7 mmol/L (ref 3.5–5.2)
Sodium: 140 mmol/L (ref 134–144)

## 2019-08-03 LAB — MAGNESIUM: Magnesium: 2.1 mg/dL (ref 1.6–2.3)

## 2019-08-07 ENCOUNTER — Ambulatory Visit: Payer: Medicare Other | Admitting: Cardiology

## 2019-08-10 ENCOUNTER — Ambulatory Visit: Payer: Medicare Other | Admitting: Cardiology

## 2019-08-10 ENCOUNTER — Other Ambulatory Visit: Payer: Self-pay | Admitting: Cardiology

## 2019-08-10 ENCOUNTER — Other Ambulatory Visit: Payer: Self-pay

## 2019-08-10 ENCOUNTER — Encounter: Payer: Self-pay | Admitting: Cardiology

## 2019-08-10 VITALS — BP 148/82 | HR 59 | Resp 15 | Ht 61.0 in | Wt 168.0 lb

## 2019-08-10 DIAGNOSIS — I10 Essential (primary) hypertension: Secondary | ICD-10-CM | POA: Diagnosis not present

## 2019-08-10 DIAGNOSIS — E782 Mixed hyperlipidemia: Secondary | ICD-10-CM | POA: Diagnosis not present

## 2019-08-10 MED ORDER — SIMVASTATIN 20 MG PO TABS: 20.0000 mg | ORAL_TABLET | Freq: Every day | ORAL | 0 refills | Status: DC

## 2019-08-10 MED ORDER — NEBIVOLOL HCL 10 MG PO TABS: 10.0000 mg | ORAL_TABLET | Freq: Every day | ORAL | 3 refills | Status: DC

## 2019-08-10 MED ORDER — EZETIMIBE 10 MG PO TABS: 10.0000 mg | ORAL_TABLET | Freq: Every day | ORAL | 2 refills | Status: DC

## 2019-08-10 NOTE — Progress Notes (Signed)
Primary Physician/Referring:  Rutherford Guys, MD  Patient ID: [name], female    DOB: August 05, 1950, 69 y.o.   MRN: [id number]  Chief Complaint  Patient presents with  . Follow-up    2 week  . Hypertension   HPI:    [name]  is a 69 y.o. who is being seen today for the evaluation for difficult to control hypertension at the request of Forrest Moron, MD. Patient's past medical history and cardiac risk factors include: Hypertension, hyperlipidemia, IBS, former smoker, postmenopausal female, obesity due to excess calories. Patient states that she was diagnosed with hypertension back in the early 1990s and since then she has been on multiple antihypertensive medications which she has been intolerant to.   On her last office visit started on Bystolic, tolerated this well and has noticed marked improvement in blood pressure.  However she ran out of the prescription samples 10 days ago.  She is now tolerating HCTZ without any itching or any side effects.  Review of prior medications illustrates that she has been on several antihypertensive medications in the past: Verapamil: Discontinued secondary to joint pain specifically in the jaw. Amlodipine: Discontinued secondary to feeling tired. Hydrochlorothiazide: Discontinued secondary to low potassium. Lisinopril discontinued secondary to cough. Losartan, Toprol-XL, propranolol, Aldactone, and they have been discontinued secondary to feeling tired and joint pain.  Past Medical History:  Diagnosis Date  . Allergy    Zyrtec PRN  . Anemia   . Cataract   . Colon polyp 03/11/2007  . Diverticulosis    by colonoscopy 2009  . Fibromyalgia    diagnosed by Dr. Teressa Lower. s/p rheumatology consultation  . GERD (gastroesophageal reflux disease)   . Hyperlipidemia   . Hypertension   . IBS (irritable bowel syndrome)    diarrhea, constipation alternating; s/p GI consult.  . Internal hemorrhoid    colonoscopy in 2009   Past  Surgical History:  Procedure Laterality Date  . CESAREAN SECTION    . COLONOSCOPY    . ENDOVENOUS ABLATION SAPHENOUS VEIN W/ LASER Right 08/17/2016   endovenous laser ablation right greater saphenous vein by Tinnie Gens MD   . MYOMECTOMY    . TUBAL LIGATION    . UPPER GASTROINTESTINAL ENDOSCOPY     Family History  Problem Relation Age of Onset  . Hypertension Mother   . Stroke Mother        multiple CVAs/TIAs  . Heart disease Mother 83       CHF  . Cancer Father        prostate cancer  . Hypertension Sister   . Hyperlipidemia Sister   . Glaucoma Sister   . Hypertension Brother   . Prostatitis Brother   . Hypertension Maternal Grandmother   . Hypertension Maternal Grandfather   . Hypertension Sister   . Esophageal cancer Neg Hx   . Rectal cancer Neg Hx   . Stomach cancer Neg Hx     Social History   Tobacco Use  . Smoking status: Former Smoker    Packs/day: 2.00    Years: 27.00    Pack years: 54.00    Quit date: 10/25/1994    Years since quitting: 24.8  . Smokeless tobacco: Never Used  Substance Use Topics  . Alcohol use: No    Alcohol/week: 0.0 standard drinks   Marital Status: Married  ROS  Review of Systems  Cardiovascular: Positive for dyspnea on exertion. Negative for chest pain and leg swelling.  Respiratory: Positive  for snoring.   Gastrointestinal: Negative for melena.   Objective  Blood pressure (!) 148/82, pulse 59, resp. rate 15, height 5\' 1"  (1.549 m), weight 168 lb (76.2 kg), SpO2 97 %.  Vitals with BMI 08/10/2019 08/02/2019 06/29/2019  Height 5\' 1"  5\' 1"  -  Weight 168 lbs 170 lbs -  BMI 16.96 78.93 -  Systolic 810 175 102  Diastolic 82 78 91  Pulse 59 59 -     Physical Exam Constitutional:      Comments: Mildly obese. No acute distress.  Cardiovascular:     Rate and Rhythm: Normal rate and regular rhythm.     Pulses: Intact distal pulses.     Heart sounds: Normal heart sounds. No murmur heard.  No gallop.      Comments: No leg edema, no  JVD. Pulmonary:     Effort: Pulmonary effort is normal.     Breath sounds: Normal breath sounds.  Abdominal:     General: Bowel sounds are normal.     Palpations: Abdomen is soft.    Laboratory examination:   Recent Labs    05/02/19 0936 06/13/19 1152 08/02/19 1430  NA 141 141 140  K 4.1 3.8 3.7  CL 105 99 101  CO2 22 27 24   GLUCOSE 91 121* 110*  BUN 11 12 11   CREATININE 0.94 0.98 1.01*  CALCIUM 9.4 9.6 9.6  GFRNONAA 62 59* 57*  GFRAA 72 68 65   estimated creatinine clearance is 48.4 mL/min (A) (by C-G formula based on SCr of 1.01 mg/dL (H)).  CMP Latest Ref Rng & Units 08/02/2019 06/13/2019 05/02/2019  Glucose 65 - 99 mg/dL 110(H) 121(H) 91  BUN 8 - 27 mg/dL 11 12 11   Creatinine 0.57 - 1.00 mg/dL 1.01(H) 0.98 0.94  Sodium 134 - 144 mmol/L 140 141 141  Potassium 3.5 - 5.2 mmol/L 3.7 3.8 4.1  Chloride 96 - 106 mmol/L 101 99 105  CO2 20 - 29 mmol/L 24 27 22   Calcium 8.7 - 10.3 mg/dL 9.6 9.6 9.4  Total Protein 6.0 - 8.5 g/dL - - 6.8  Total Bilirubin 0.0 - 1.2 mg/dL - - 0.2  Alkaline Phos 39 - 117 IU/L - - 71  AST 0 - 40 IU/L - - 9  ALT 0 - 32 IU/L - - 8   CBC Latest Ref Rng & Units 07/27/2017 06/09/2017 05/25/2017  WBC 3.4 - 10.8 x10E3/uL 3.5 6.3 3.7  Hemoglobin 11.1 - 15.9 g/dL 14.0 14.7 14.4  Hematocrit 34.0 - 46.6 % 41.9 44.0 42.6  Platelets 150 - 450 x10E3/uL 165 188 153   Lipid Panel    Component Value Date/Time   CHOL 246 (H) 05/02/2019 0936   TRIG 244 (H) 05/02/2019 0936   HDL 43 05/02/2019 0936   CHOLHDL 5.7 (H) 05/02/2019 0936   CHOLHDL 3.9 07/22/2015 1356   VLDL 24 07/22/2015 1356   LDLCALC 158 (H) 05/02/2019 0936    HEMOGLOBIN A1C Lab Results  Component Value Date   HGBA1C 5.6 07/27/2017   MPG 111 07/22/2015   TSH No results for input(s): TSH in the last 8760 hours.  Medications and allergies   Allergies  Allergen Reactions  . Aspirin Shortness Of Breath  . Pollen Extract     Skin itching, runny eyes, sinus pressure  . Sulfa Antibiotics  Itching    itching     Current Outpatient Medications  Medication Instructions  . ezetimibe (ZETIA) 10 mg, Oral, Daily after supper  . FLUoxetine (PROZAC) 20 mg,  Oral, Daily  . hydrochlorothiazide (MICROZIDE) 12.5 mg, Oral, Daily  . nebivolol (BYSTOLIC) 10 mg, Oral, Daily  . simvastatin (ZOCOR) 20 mg, Oral, Daily at bedtime    Medications Discontinued During This Encounter  Medication Reason  . nebivolol (BYSTOLIC) tablet 10 mg     Radiology:   No results found.  Cardiac Studies:   Exercise Stress Testing MPI 04/02/2016: Small size and intensity fixed basal septal artifact. No reversible ischemia. LVEF 71% with normal wall motion. This is a low risk study. Hypertensive.   Echocardiogram 05/25/2019:  Left ventricle cavity is normal in size. Moderate concentric hypertrophy  of the left ventricle. Normal global wall motion. Normal LV systolic  function with EF 59%. Doppler evidence of grade I (impaired) diastolic  dysfunction, normal LAP.  Left atrial cavity is normal in size. Aneurysmal interatrial septum without 2D or color Doppler evidence of interatrial shunt.  Mild tricuspid regurgitation. Estimated pulmonary artery systolic pressure is 22 mmHg.  EKG: 05/22/2019: Normal sinus rhythm, 65 bpm, normal axis, T wave inversions in anterior precordial leads, without underlying injury pattern.   Assessment     ICD-10-CM   1. Essential hypertension  I10   2. Mixed hyperlipidemia  E78.2 simvastatin (ZOCOR) 20 MG tablet    ezetimibe (ZETIA) 10 MG tablet    Lipid Panel With LDL/HDL Ratio    Lipid Panel With LDL/HDL Ratio    Recommendations:   [name]  is a 69 y.o. who is being seen today for the evaluation for difficult to control hypertension at the request of Forrest Moron, MD. Patient's past medical history and cardiac risk factors include: Hypertension, hyperlipidemia, IBS, former smoker, postmenopausal female, obesity due to excess calories. Patient states that she  was diagnosed with hypertension back in the early 1990s and since then she has been on multiple antihypertensive medications which she has been intolerant to.   (Medication intolerances: Verapamil, amlodipine, due to joint pain and feeling tired.  HCTZ due to low potassium.  Lisinopril secondary to cough.  Losartan, Toprol-XL, propranolol, Aldactone due to fatigue).  On her last office visit I given her samples of Bystolic which she is tolerating.  Her blood pressure had improved significantly and in fact has been perfectly under good control until she ran out of her samples 10 days ago.  She is also now tolerating hydrochlorothiazide.  Advised her to continue the same, I have refilled her prescription for Bystolic.  I reviewed the results of the echocardiogram, left ventricular hypertrophy, diastolic dysfunction described to the patient.  I also described and discussed with her regarding hyperlipidemia and the risk of stroke in future coronary artery disease.  She is now willing to try a statin, will start her on Vytorin 20/10 mg q. OD although prescription states daily.  I will obtain lipid profile testing in 1 month and see her back in 6 weeks.   Adrian Prows, MD, [facility] 08/10/2019, 11:10 AM Office: [phone]

## 2019-08-26 ENCOUNTER — Other Ambulatory Visit: Payer: Self-pay | Admitting: Cardiology

## 2019-08-26 DIAGNOSIS — I10 Essential (primary) hypertension: Secondary | ICD-10-CM

## 2019-08-28 ENCOUNTER — Ambulatory Visit: Payer: Medicare Other

## 2019-09-13 ENCOUNTER — Ambulatory Visit: Payer: Medicare Other | Attending: Family Medicine | Admitting: Physical Therapy

## 2019-09-13 ENCOUNTER — Other Ambulatory Visit: Payer: Self-pay

## 2019-09-13 ENCOUNTER — Encounter: Payer: Self-pay | Admitting: Physical Therapy

## 2019-09-13 DIAGNOSIS — M5442 Lumbago with sciatica, left side: Secondary | ICD-10-CM | POA: Insufficient documentation

## 2019-09-13 DIAGNOSIS — R293 Abnormal posture: Secondary | ICD-10-CM | POA: Insufficient documentation

## 2019-09-13 DIAGNOSIS — R42 Dizziness and giddiness: Secondary | ICD-10-CM | POA: Diagnosis present

## 2019-09-13 DIAGNOSIS — R2689 Other abnormalities of gait and mobility: Secondary | ICD-10-CM

## 2019-09-13 DIAGNOSIS — G8929 Other chronic pain: Secondary | ICD-10-CM | POA: Diagnosis present

## 2019-09-13 NOTE — Therapy (Signed)
[place], [place], [address] Phone: [phone]   Fax:  [phone]  Physical Therapy Evaluation  Patient Details  Name: [name] MRN: [id number] Date of Birth: [birth year] Referring Provider (PT): Rutherford Guys, MD   Encounter Date: 09/13/2019   PT End of Session - 09/13/19 1503    Visit Number 1    Number of Visits 12    Date for PT Re-Evaluation 10/25/19    PT Start Time 1448    Activity Tolerance Patient tolerated treatment well    Behavior During Therapy [facility] for tasks assessed/performed           Past Medical History:  Diagnosis Date   Allergy    Zyrtec PRN   Anemia    Cataract    Colon polyp 03/11/2007   Diverticulosis    by colonoscopy 2009   Fibromyalgia    diagnosed by Dr. Teressa Lower. s/p rheumatology consultation   GERD (gastroesophageal reflux disease)    Hyperlipidemia    Hypertension    IBS (irritable bowel syndrome)    diarrhea, constipation alternating; s/p GI consult.   Internal hemorrhoid    colonoscopy in 2009    Past Surgical History:  Procedure Laterality Date   CESAREAN SECTION     COLONOSCOPY     ENDOVENOUS ABLATION SAPHENOUS VEIN W/ LASER Right 08/17/2016   endovenous laser ablation right greater saphenous vein by Tinnie Gens MD    [facility]      There were no vitals filed for this visit.    Subjective Assessment - 09/13/19 1451    Subjective Pt reports she was diagnosed with osteopenia. Pt reports issues with low back. She notes being told of a bulging disc in the past. Pt states she gets knots in her back along with bilat hip pain (R getting worse than L). Pt states her back and left leg will go out at times. Pt notes that today she feels it radiating down her L leg (mostly along anterior thigh and then towards medial calf). Reports this has been happening for years now.     Pertinent History Osteopenia, bulging discs,    Limitations Sitting;Lifting;Standing;Walking    How long can you sit comfortably? ~15 minutes    How long can you stand comfortably? ~20 minutes    How long can you walk comfortably? Left leg at times buckles; a football field length    Patient Stated Goals Reduce pain; improve strength for activities such as hiking and exercising    Currently in Pain? Yes    Pain Score 6     Pain Location Back    Pain Orientation Right;Left    Pain Descriptors / Indicators Aching;Sore;Radiating    Pain Type Chronic pain    Pain Onset More than a month ago    Aggravating Factors  hypertensive medications at times, standing, walking    Pain Relieving Factors Tylenol, vibrating massage              [facility] PT Assessment - 09/13/19 0001      Assessment   Medical Diagnosis Chronic right-sided low back pain without sciatica    Referring Provider (PT) Rutherford Guys, MD    Prior Therapy Yes, yrs ago for bulging discs      Precautions   Precautions None      Restrictions   Weight Bearing Restrictions No  Balance Screen   Has the patient fallen in the past 6 months No      [place] residence      Prior Function   Level of Independence Independent      Observation/Other Assessments   Focus on Therapeutic Outcomes (FOTO)  56%      AROM   Lumbar Flexion feels pain at 25%; however, WFL    Lumbar Extension 50%    Lumbar - Right Side Bend WFL    Lumbar - Left Side Bend feels pain at 50%; however, WFL    Lumbar - Right Rotation WFL    Lumbar - Left Rotation 50%      Strength   Right Hip Flexion 3+/5    Right Hip Extension 3/5    Right Hip External Rotation  4+/5    Right Hip Internal Rotation 4+/5    Right Hip ABduction 3+/5    Left Hip Flexion 3+/5    Left Hip Extension 3/5    Left Hip External Rotation 4+/5    Left Hip Internal Rotation 4+/5    Left Hip ABduction 3+/5      Palpation   SI  assessment  Supine to long sit: R = L in length; in sit R longer      FABER test   findings Negative    Comment --   Feels in back on R; feels mostly hip on L     Slump test   Findings Negative      Prone Knee Bend Test   Findings Positive    Comment bilaterally; L worse than R      Straight Leg Raise   Findings Positive    Comment bilaterally; R worse than L                      Objective measurements completed on examination: See above findings.               PT Education - 09/13/19 1648    Education Details Discussed exam findings. Discussed POC and HEP    Person(s) Educated Patient    Methods Explanation;Demonstration;Tactile cues;Verbal cues;Handout    Comprehension Verbalized understanding;Returned demonstration;Verbal cues required;Tactile cues required            PT Short Term Goals - 09/13/19 1650      PT SHORT TERM GOAL #1   Title Pt will be independent with initial HEP    Baseline Newly provided    Time 3    Period Weeks    Status New    Target Date 10/04/19      PT SHORT TERM GOAL #2   Title Pt will report no radiating pain sensation down her legs for improved pt comfort    Time 3    Period Weeks    Status New    Target Date 10/04/19      PT SHORT TERM GOAL #3   Title Pt will be able to improve bilat LE strength to be able to perform 10 squats    Baseline Unable    Time 3    Period Weeks    Status New    Target Date 10/04/19      PT SHORT TERM GOAL #4   Title Pt will be able to tolerate sitting at least 20 minutes without discomfort    Baseline tolerates ~15 min    Time 3    Period Weeks  Status New    Target Date 10/04/19      PT SHORT TERM GOAL #5   Title --    Time --    Period --    Status --    Target Date --             PT Long Term Goals - 09/13/19 1654      PT LONG TERM GOAL #1   Title Pt will be independent with advanced HEP    Time 6    Period Weeks    Status New    Target Date 10/25/19       PT LONG TERM GOAL #2   Title Pt will be able to walk ~1 mile without discomfort    Baseline Only able to comfortably walk a football field length    Time 6    Period Weeks    Status New    Target Date 10/25/19      PT LONG TERM GOAL #3   Title Pt will be able to sit at least 30 minutes without discomfort    Time 6    Period Weeks    Status New    Target Date 10/25/19      PT LONG TERM GOAL #4   Title Pt will be able to tolerate standing at least 30 minutes without discomfort    Time 6    Period Weeks    Status New    Target Date 10/25/19      PT LONG TERM GOAL #5   Title Pt will improve her FOTO score to </=40%    Baseline 56%    Time 6    Period Weeks    Status New    Target Date 10/25/19                  Plan - 09/13/19 1631    Clinical Impression Statement Ms. Errickson is a 69 y/o F presenting to O[facility]T with c/o chronic bilat low back/hip pain. At this time, pt with radiating pain down L anterior thigh and medial calf that appears consistent with femoral neuropathy. Pt found to have likely posteriorly rotated R vs L inominate, L QL and hip flexor tightness, R hamstring tightness with weak overall hip/core musculature resulting in back and pelvic/hip pain. Due to these issues, pt with reduced tolerance to standing, sitting, and walking. Pt would benefit from PT to improve on her deficits to optimize her level of function.    Personal Factors and Comorbidities Age;Comorbidity 1;Comorbidity 2    Comorbidities anxiety, GI disease, HTN, visual impairment    Examination-Activity Limitations Bend;Carry;Lift;Squat;Sit;Stand;Stairs;Locomotion Level    Examination-Participation Restrictions Cleaning;Community Activity;Driving;Laundry;Shop    Stability/Clinical Decision Making Evolving/Moderate complexity    Clinical Decision Making Moderate    Rehab Potential Good    PT Frequency 2x / week    PT Duration 6 weeks    PT Treatment/Interventions ADLs/Self Care Home  Management;Aquatic Therapy;Cryotherapy;Electrical Stimulation;Iontophoresis 25m/ml Dexamethasone;Moist Heat;Traction;Gait training;Stair training;Functional mobility training;Therapeutic activities;Therapeutic exercise;Balance training;Neuromuscular re-education;Patient/family education;Manual techniques;Passive range of motion;Dry needling;Taping;Spinal Manipulations;Joint Manipulations    PT Next Visit Plan Assess response to HEP. Reassess pelvis for possible need of MET. Continue to progress core/hip strengthening and hip stretching. Go over FOTO results.    PT Home Exercise Plan Access Code: [email or web address]    Consulted and Agree with Plan of Care Patient           Patient will benefit from skilled therapeutic intervention in order to improve the following  deficits and impairments:  Decreased range of motion, Increased fascial restricitons, Pain, Hypomobility, Impaired flexibility, Improper body mechanics, Decreased mobility, Decreased strength, Postural dysfunction  Visit Diagnosis: Chronic bilateral low back pain with left-sided sciatica  Abnormal posture  Other abnormalities of gait and mobility     Problem List Patient Active Problem List   Diagnosis Date Noted   Dyslipidemia 12/09/2017   Class 1 obesity due to excess calories with serious comorbidity and body mass index (BMI) of 30.0 to 30.9 in adult 12/09/2017   Muscle cramps 12/09/2017   Osteopenia of right hip 08/02/2017   Gastroesophageal reflux disease without esophagitis 07/28/2017   Irritable bowel syndrome with diarrhea 09/18/2016   Urge incontinence of urine 09/18/2016   Inflamed external hemorrhoid 07/24/2016   Varicose veins of right lower extremity with complications 01/00/7121   Essential hypertension 08/10/2015   Pure hypercholesterolemia 08/10/2015   Glucose intolerance (impaired glucose tolerance) 08/10/2015    Sitlaly Gudiel April Ma L Arietta Eisenstein PT, DPT 09/13/2019, 4:58 PM  [facility] [address] [place], [place], [address] Phone: [phone]   Fax:  [phone]  Name: [name] MRN: [id number] Date of Birth: 11/30/1950

## 2019-09-13 NOTE — Patient Instructions (Signed)
Access Code: [email or web address] URL: [email or web address] Date: 09/13/2019 Prepared by: Estill Bamberg April Thurnell Garbe  Exercises Hip Flexor Stretch at [facility] of Bed - 1 x daily - 7 x weekly - 3 sets - 30 sec hold Supine Hamstring Stretch with Strap - 1 x daily - 7 x weekly - 3 sets - 30 sec hold Supine Lower Trunk Rotation - 1 x daily - 7 x weekly - 1 sets - 10 reps - 5 sec hold Supine Bridge - 1 x daily - 7 x weekly - 3 sets - 10 reps

## 2019-09-21 IMAGING — DX DG LUMBAR SPINE COMPLETE 4+V
5 series · 5 of 5 positions shown · non-contrast
Comparison: Lumbar radiographs 07/22/2015. Right hip series today
reported separately.

CLINICAL DATA: 66-year-old female with right hip pain, chronic
right side lumbar back pain.

EXAM:
LUMBAR SPINE - COMPLETE 4+ VIEW

[l-spine ap]
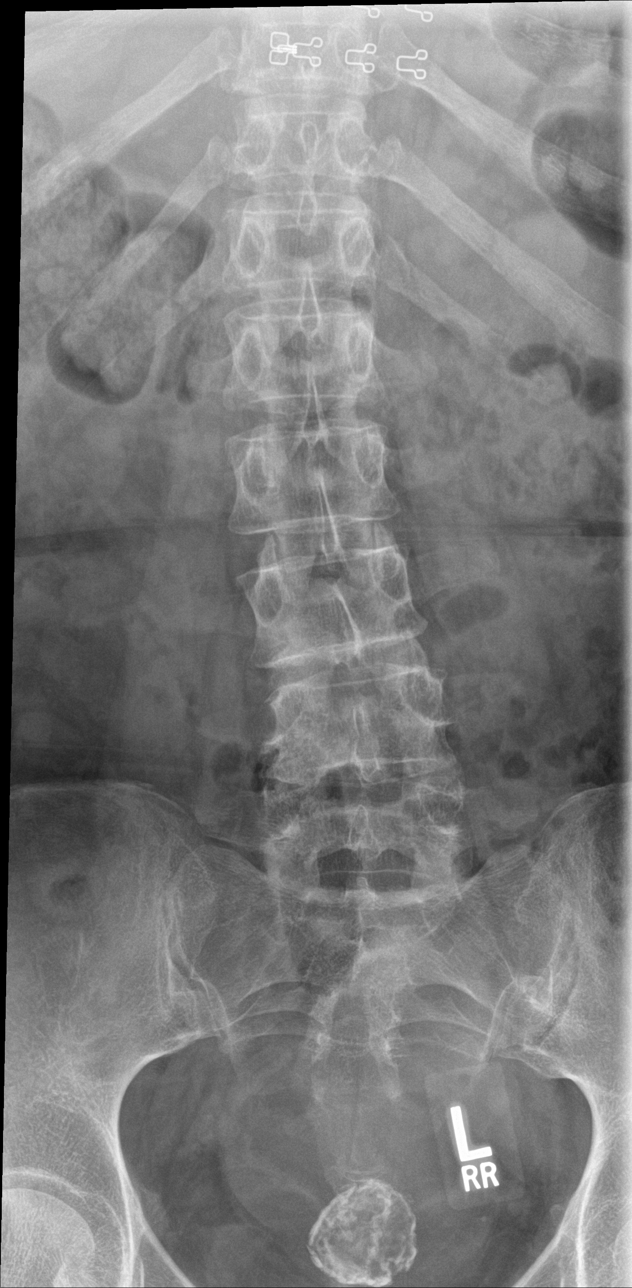

[l-spine obl (1 of 2)]
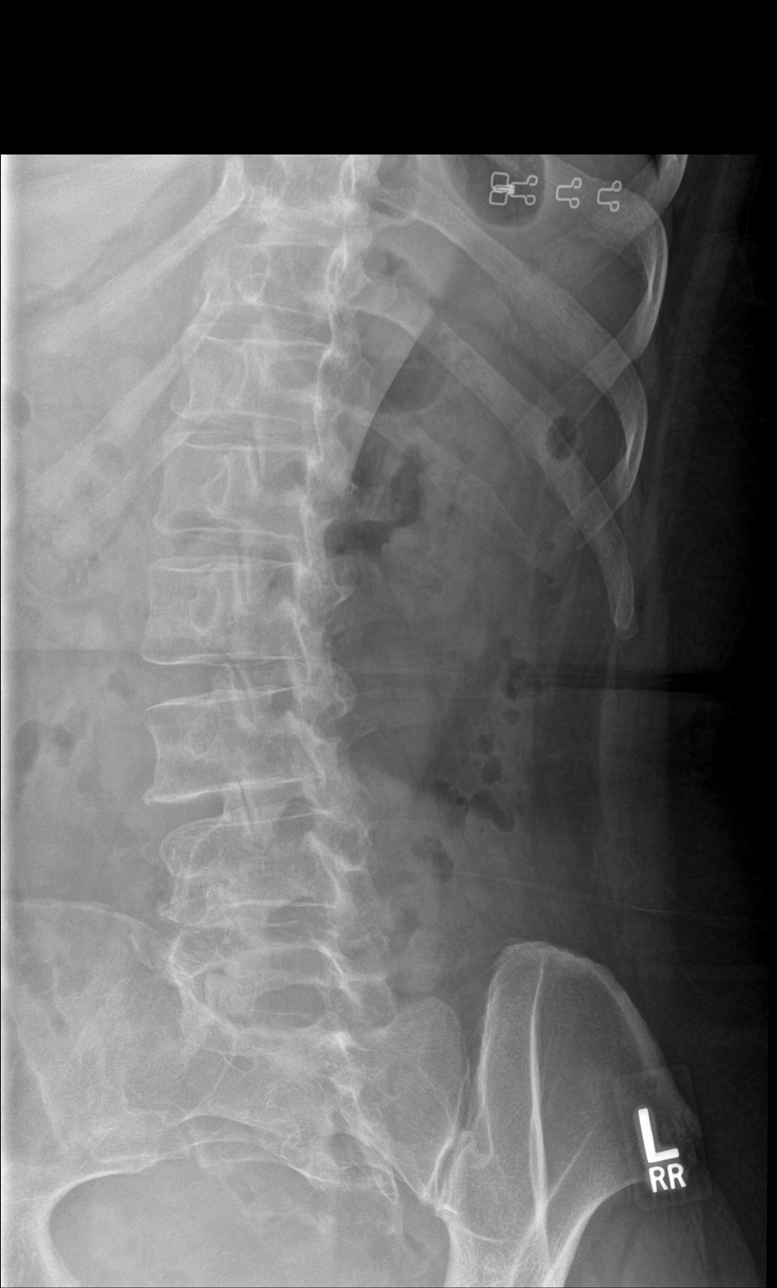

[l-spine obl (2 of 2)]
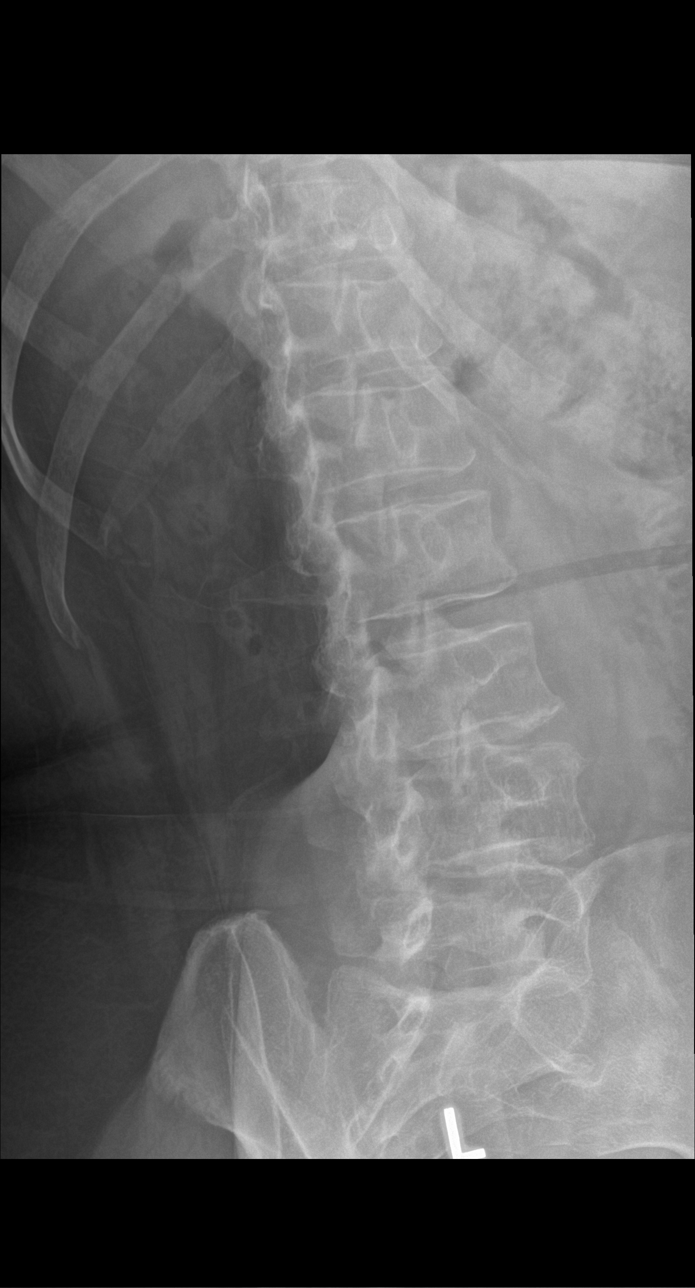

[l-spine lat]
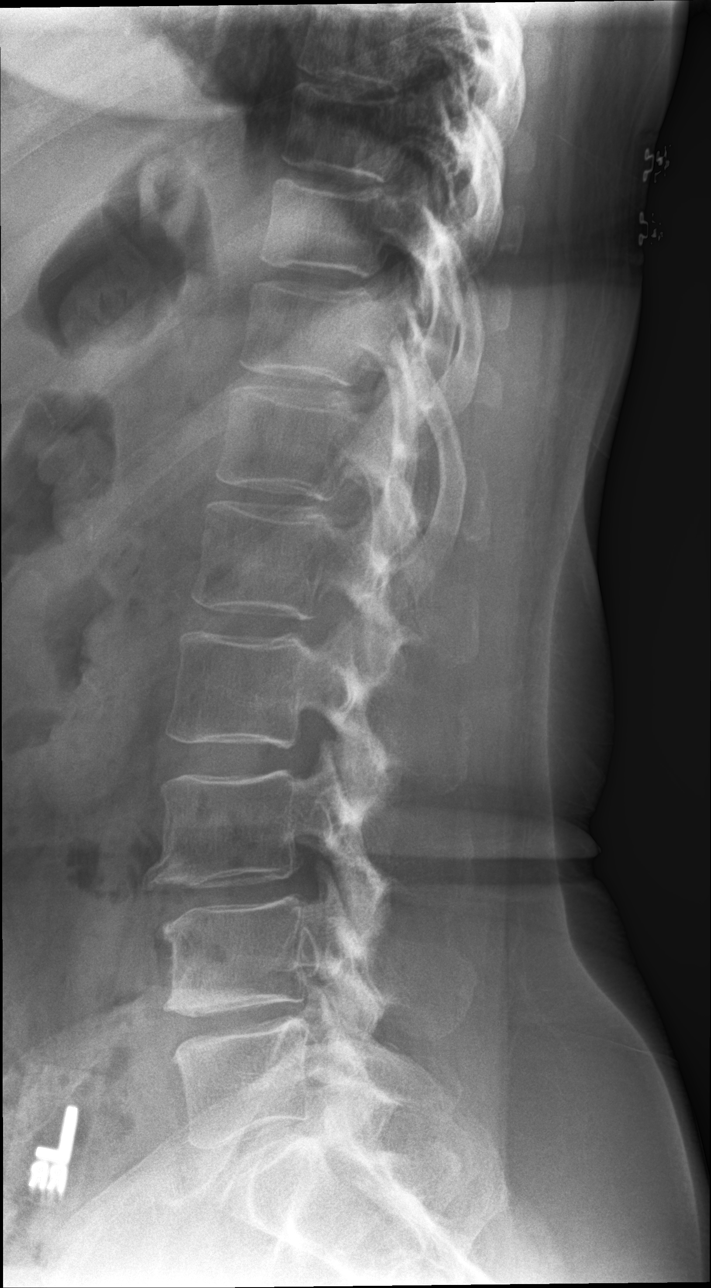

[l-spine l5-s1]
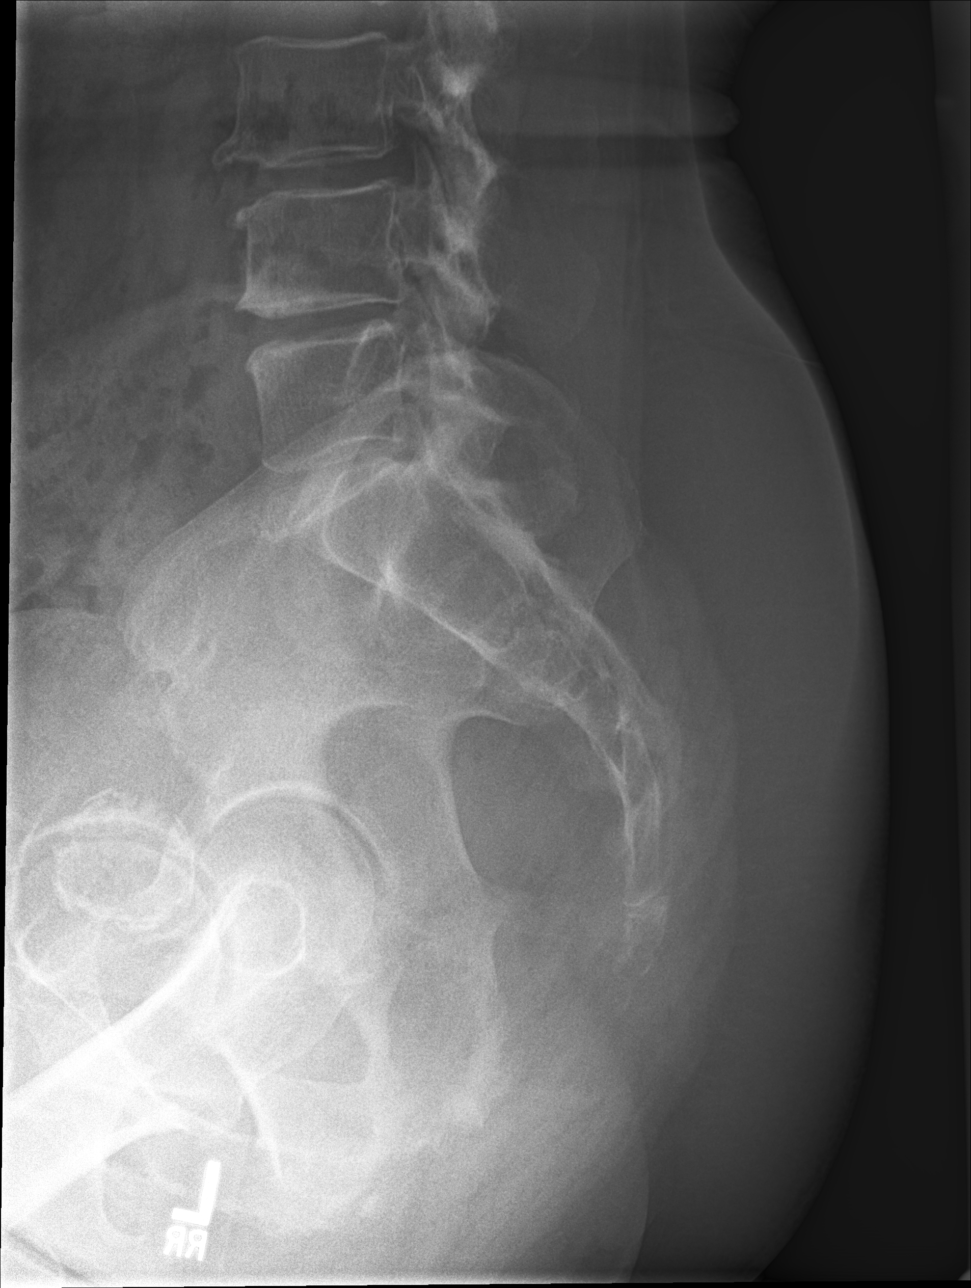

[5 of 5 positions shown; findings below may reference images not displayed]

FINDINGS: Normal lumbar segmentation. Chronic straightening of lumbar
lordosis. Chronic trace anterolisthesis of L3 on L4 is stable. Mild
S-shaped lower thoracic and lumbar scoliotic curvature is stable. No
pars fracture. Chronic mild lumbar facet hypertrophy, up to moderate
at L3-L4. Evidence of congenital lower lumbar spinal canal narrowing
due to short pedicle distance. Relatively preserved lumbar disc
spaces. Chronic anterior endplate degeneration at L3-L4 and L4-L5.
The visible lower thoracic levels appear intact. No acute osseous
abnormality identified. The sacrum and SI joints appear normal.
IMPRESSION: 1.  No acute osseous abnormality identified in the lumbar spine.
2. Congenital lower lumbar spinal stenosis suspected due to short
pedicle distance.
3. Chronic lumbar endplate degeneration at L3-L4 and L4-L5.
4. Mild grade 1 anterolisthesis at L3-L4 with moderate facet
degeneration.

## 2019-09-24 ENCOUNTER — Ambulatory Visit: Payer: Medicare Other | Admitting: Cardiology

## 2019-09-25 ENCOUNTER — Encounter: Payer: Self-pay | Admitting: Physical Therapy

## 2019-09-25 ENCOUNTER — Ambulatory Visit: Payer: Medicare Other | Admitting: Physical Therapy

## 2019-09-25 ENCOUNTER — Other Ambulatory Visit: Payer: Self-pay

## 2019-09-25 DIAGNOSIS — R2689 Other abnormalities of gait and mobility: Secondary | ICD-10-CM

## 2019-09-25 DIAGNOSIS — G8929 Other chronic pain: Secondary | ICD-10-CM

## 2019-09-25 DIAGNOSIS — M5442 Lumbago with sciatica, left side: Secondary | ICD-10-CM | POA: Diagnosis not present

## 2019-09-25 DIAGNOSIS — R42 Dizziness and giddiness: Secondary | ICD-10-CM

## 2019-09-25 DIAGNOSIS — R293 Abnormal posture: Secondary | ICD-10-CM

## 2019-09-25 NOTE — Therapy (Signed)
[place], [place], [address] Phone: [phone]   Fax:  [phone]  Physical Therapy Treatment  Patient Details  Name: [name] MRN: [id number] Date of Birth: Dec 02, 1950 Referring Provider (PT): Rutherford Guys, MD   Encounter Date: 09/25/2019   PT End of Session - 09/25/19 1508    Visit Number 2    Number of Visits 12    Date for PT Re-Evaluation 10/25/19    PT Start Time 1505    PT Stop Time 3267    PT Time Calculation (min) 50 min    Activity Tolerance Patient tolerated treatment well    Behavior During Therapy [facility] for tasks assessed/performed           Past Medical History:  Diagnosis Date   Allergy    Zyrtec PRN   Anemia    Cataract    Colon polyp 03/11/2007   Diverticulosis    by colonoscopy 2009   Fibromyalgia    diagnosed by Dr. Teressa Lower. s/p rheumatology consultation   GERD (gastroesophageal reflux disease)    Hyperlipidemia    Hypertension    IBS (irritable bowel syndrome)    diarrhea, constipation alternating; s/p GI consult.   Internal hemorrhoid    colonoscopy in 2009    Past Surgical History:  Procedure Laterality Date   CESAREAN SECTION     COLONOSCOPY     ENDOVENOUS ABLATION SAPHENOUS VEIN W/ LASER Right 08/17/2016   endovenous laser ablation right greater saphenous vein by Tinnie Gens MD    [place] [facility]      There were no vitals filed for this visit.   Subjective Assessment - 09/25/19 1511    Subjective I am worse at night and pain goes down my legs. through my hips.    Pertinent History Osteopenia, bulging discs,    Limitations Sitting;Lifting;Standing;Walking    Patient Stated Goals Reduce pain; improve strength for activities such as hiking and exercising    Currently in Pain? Yes    Pain Score 6    9/10 at night   Pain Location Back    Pain Orientation Right;Left    Pain  Descriptors / Indicators Aching    Pain Type Chronic pain    Pain Onset More than a month ago                             [facility] Adult PT Treatment/Exercise - 09/25/19 0001      Lumbar Exercises: Stretches   Active Hamstring Stretch 4 reps;30 seconds    Active Hamstring Stretch Limitations 2 x RT and LT    Hip Flexor Stretch Right;Left;3 reps;30 seconds      Lumbar Exercises: Aerobic   Nustep L3  7 min  RPE 3/4      Lumbar Exercises: Standing   Other Standing Lumbar Exercises sit to stand x 10 at sink and with chair behind      Lumbar Exercises: Supine   Bridge 10 reps    Bridge Limitations x2   with Moist heat concurrent     Modalities   Modalities Moist Heat      Moist Heat Therapy   Number Minutes Moist Heat 15 Minutes    Moist Heat Location Lumbar Spine   concurrent with exercise     Manual Therapy   Manual Therapy Soft tissue mobilization;Other (comment)  Manual therapy comments skilled palpation with TPDN    Soft tissue mobilization bil lumbar sacral tissues            Trigger Point Dry Needling - 09/25/19 0001    Consent Given? Yes    Education Handout Provided Yes    Muscles Treated Back/Hip Gluteus medius;Gluteus maximus;Lumbar multifidi   bil   Dry Needling Comments 100 mm .40 gage    Gluteus Medius Response Palpable increased muscle length    Gluteus Maximus Response Palpable increased muscle length    Lumbar multifidi Response Twitch response elicited;Palpable increased muscle length                PT Education - 09/25/19 1524    Education Details Went over [place] report and education on TPDN after care and precautians. reinforced HEP    Person(s) Educated Patient    Methods Explanation;Demonstration    Comprehension Verbalized understanding;Returned demonstration            PT Short Term Goals - 09/13/19 1650      PT SHORT TERM GOAL #1   Title Pt will be independent with initial HEP    Baseline Newly provided    Time  3    Period Weeks    Status New    Target Date 10/04/19      PT SHORT TERM GOAL #2   Title Pt will report no radiating pain sensation down her legs for improved pt comfort    Time 3    Period Weeks    Status New    Target Date 10/04/19      PT SHORT TERM GOAL #3   Title Pt will be able to improve bilat LE strength to be able to perform 10 squats    Baseline Unable    Time 3    Period Weeks    Status New    Target Date 10/04/19      PT SHORT TERM GOAL #4   Title Pt will be able to tolerate sitting at least 20 minutes without discomfort    Baseline tolerates ~15 min    Time 3    Period Weeks    Status New    Target Date 10/04/19      PT SHORT TERM GOAL #5   Title --    Time --    Period --    Status --    Target Date --             PT Long Term Goals - 09/13/19 1654      PT LONG TERM GOAL #1   Title Pt will be independent with advanced HEP    Time 6    Period Weeks    Status New    Target Date 10/25/19      PT LONG TERM GOAL #2   Title Pt will be able to walk ~1 mile without discomfort    Baseline Only able to comfortably walk a football field length    Time 6    Period Weeks    Status New    Target Date 10/25/19      PT LONG TERM GOAL #3   Title Pt will be able to sit at least 30 minutes without discomfort    Time 6    Period Weeks    Status New    Target Date 10/25/19      PT LONG TERM GOAL #4   Title Pt will be able to tolerate standing  at least 30 minutes without discomfort    Time 6    Period Weeks    Status New    Target Date 10/25/19      PT LONG TERM GOAL #5   Title Pt will improve her FOTO score to </=40%    Baseline 56%    Time 6    Period Weeks    Status New    Target Date 10/25/19            Access Code: [email or web address]: [email or web address]: 09/14/2021Prepared by: Donnetta Simpers BeardsleyExercises   Sit to Stand with Arm Reach Toward Target - 1 x daily - 7 x weekly - 3 sets - 10 reps       Plan - 09/25/19  1514    Clinical Impression Statement Pt enters clinic with complaint of pain in bil low back and can be up to 9/10 at night.  Pt consents to TPDN and was closely monitoreed throughout session.  Pt seemed to appreciate benefit from [facility] after RX session.  Pt was able to participate with sit to stand and was encouraged she was able to perform with less pain. Will continue POC with progression exercise    Personal Factors and Comorbidities Age;Comorbidity 1;Comorbidity 2    Comorbidities anxiety, GI disease, HTN, visual impairment    Examination-Activity Limitations Bend;Carry;Lift;Squat;Sit;Stand;Stairs;Locomotion Level    Examination-Participation Restrictions Cleaning;Community Activity;Driving;Laundry;Shop    PT Frequency 2x / week    PT Duration 6 weeks    PT Treatment/Interventions ADLs/Self Care Home Management;Aquatic Therapy;Cryotherapy;Electrical Stimulation;Iontophoresis 75m/ml Dexamethasone;Moist Heat;Traction;Gait training;Stair training;Functional mobility training;Therapeutic activities;Therapeutic exercise;Balance training;Neuromuscular re-education;Patient/family education;Manual techniques;Passive range of motion;Dry needling;Taping;Spinal Manipulations;Joint Manipulations    PT Next Visit Plan Assess response to HEP and TPDN   Reassess pelvis for possible need of MET. Continue to progress core/hip strengthening and hip stretching    PT Home Exercise Plan Access Code: [email or web address]           Patient will benefit from skilled therapeutic intervention in order to improve the following deficits and impairments:  Decreased range of motion, Increased fascial restricitons, Pain, Hypomobility, Impaired flexibility, Improper body mechanics, Decreased mobility, Decreased strength, Postural dysfunction  Visit Diagnosis: Chronic bilateral low back pain with left-sided sciatica  Abnormal posture  Other abnormalities of gait and mobility  Dizziness and giddiness     Problem List Patient  Active Problem List   Diagnosis Date Noted   Dyslipidemia 12/09/2017   Class 1 obesity due to excess calories with serious comorbidity and body mass index (BMI) of 30.0 to 30.9 in adult 12/09/2017   Muscle cramps 12/09/2017   Osteopenia of right hip 08/02/2017   Gastroesophageal reflux disease without esophagitis 07/28/2017   Irritable bowel syndrome with diarrhea 09/18/2016   Urge incontinence of urine 09/18/2016   Inflamed external hemorrhoid 07/24/2016   Varicose veins of right lower extremity with complications 134/91/7915  Essential hypertension 08/10/2015   Pure hypercholesterolemia 08/10/2015   Glucose intolerance (impaired glucose tolerance) 08/10/2015   LVoncille Lo PT, ABeavertonCertified Exercise Expert for the Aging Adult  09/25/19 5:53 PM Phone: [phone]: 3[facility]CSelect [facility] Salem[address]GPonce Inlet NAl[place] [address]Phone: [phone]  Fax:  [phone] Name: Stefanie NEISESMRN: [id number] of Birth: 7Feb 25, 1952

## 2019-09-25 NOTE — Patient Instructions (Addendum)
Trigger Point Dry Needling  . What is Trigger Point Dry Needling (DN)? o DN is a physical therapy technique used to treat muscle pain and dysfunction. Specifically, DN helps deactivate muscle trigger points (muscle knots).  o A thin filiform needle is used to penetrate the skin and stimulate the underlying trigger point. The goal is for a local twitch response (LTR) to occur and for the trigger point to relax. No medication of any kind is injected during the procedure.   . What Does Trigger Point Dry Needling Feel Like?  o The procedure feels different for each individual patient. Some patients report that they do not actually feel the needle enter the skin and overall the process is not painful. Very mild bleeding may occur. However, many patients feel a deep cramping in the muscle in which the needle was inserted. This is the local twitch response.   Marland Kitchen How Will I feel after the treatment? o Soreness is normal, and the onset of soreness may not occur for a few hours. Typically this soreness does not last longer than two days.  o Bruising is uncommon, however; ice can be used to decrease any possible bruising.  o In rare cases feeling tired or nauseous after the treatment is normal. In addition, your symptoms may get worse before they get better, this period will typically not last longer than 24 hours.   . What Can I do After My Treatment? o Increase your hydration by drinking more water for the next 24 hours. o You may place ice or heat on the areas treated that have become sore, however, do not use heat on inflamed or bruised areas. Heat often brings more relief post needling. o You can continue your regular activities, but vigorous activity is not recommended initially after the treatment for 24 hours. o DN is best combined with other physical therapy such as strengthening, stretching, and other therapies.     Voncille Lo, PT, [place] Certified Exercise Expert for the Aging Adult   09/25/19 3:24 PM Phone: [phone] Fax: [phone]

## 2019-09-27 ENCOUNTER — Ambulatory Visit: Payer: Medicare Other

## 2019-09-27 ENCOUNTER — Other Ambulatory Visit: Payer: Self-pay

## 2019-09-27 DIAGNOSIS — M5442 Lumbago with sciatica, left side: Secondary | ICD-10-CM | POA: Diagnosis not present

## 2019-09-27 DIAGNOSIS — R293 Abnormal posture: Secondary | ICD-10-CM

## 2019-09-27 DIAGNOSIS — R2689 Other abnormalities of gait and mobility: Secondary | ICD-10-CM

## 2019-09-27 DIAGNOSIS — G8929 Other chronic pain: Secondary | ICD-10-CM

## 2019-09-27 DIAGNOSIS — R42 Dizziness and giddiness: Secondary | ICD-10-CM

## 2019-09-27 NOTE — Therapy (Signed)
[place], [place], [address] Phone: [phone]   Fax:  [phone]  Physical Therapy Treatment  Patient Details  Name: [name] MRN: [id number] Date of Birth: 04-May-1950 Referring Provider (PT): Rutherford Guys, MD   Encounter Date: 09/27/2019   PT End of Session - 09/27/19 1508    Visit Number 3    Number of Visits 12    Date for PT Re-Evaluation 10/25/19    PT Start Time 1502    PT Stop Time 1555    PT Time Calculation (min) 53 min    Activity Tolerance Patient tolerated treatment well    Behavior During Therapy [facility] for tasks assessed/performed           Past Medical History:  Diagnosis Date  . Allergy    Zyrtec PRN  . Anemia   . Cataract   . Colon polyp 03/11/2007  . Diverticulosis    by colonoscopy 2009  . Fibromyalgia    diagnosed by Dr. Teressa Lower. s/p rheumatology consultation  . GERD (gastroesophageal reflux disease)   . Hyperlipidemia   . Hypertension   . IBS (irritable bowel syndrome)    diarrhea, constipation alternating; s/p GI consult.  . Internal hemorrhoid    colonoscopy in 2009    Past Surgical History:  Procedure Laterality Date  . CESAREAN SECTION    . COLONOSCOPY    . ENDOVENOUS ABLATION SAPHENOUS VEIN W/ LASER Right 08/17/2016   endovenous laser ablation right greater saphenous vein by Tinnie Gens MD   . MYOMECTOMY    . TUBAL LIGATION    . UPPER GASTROINTESTINAL ENDOSCOPY      There were no vitals filed for this visit.   Subjective Assessment - 09/27/19 1507    Subjective Moving around sometimes causes pain. The worst is at night and after she exercises.    Pertinent History Osteopenia, bulging discs,    Limitations Sitting;Lifting;Standing;Walking    Patient Stated Goals Reduce pain; improve strength for activities such as hiking and exercising    Currently in Pain? Yes    Pain Score 4     Pain Location Back    Pain Orientation Right;Left     Pain Descriptors / Indicators Sore    Pain Type Chronic pain    Pain Onset More than a month ago                             [facility] Adult PT Treatment/Exercise - 09/27/19 0001      Lumbar Exercises: Stretches   Active Hamstring Stretch 2 reps;30 seconds    Hip Flexor Stretch Right;Left;3 reps;30 seconds    Pelvic Tilt 10 reps;5 seconds    ITB Stretch 2 reps;30 seconds    ITB Stretch Limitations knee bent, c strap      Lumbar Exercises: Aerobic   Nustep L3 6 min BUE/LE      Lumbar Exercises: Supine   AB Set Limitations + marching x 5 ea and x 10 alt    Bridge 10 reps    Bridge Limitations x1      Modalities   Modalities Moist Heat      Moist Heat Therapy   Number Minutes Moist Heat 10 Minutes    Moist Heat Location Lumbar Spine      Manual Therapy   Manual Therapy Joint mobilization;Soft tissue mobilization  PT Short Term Goals - 09/13/19 1650      PT SHORT TERM GOAL #1   Title Pt will be independent with initial HEP    Baseline Newly provided    Time 3    Period Weeks    Status New    Target Date 10/04/19      PT SHORT TERM GOAL #2   Title Pt will report no radiating pain sensation down her legs for improved pt comfort    Time 3    Period Weeks    Status New    Target Date 10/04/19      PT SHORT TERM GOAL #3   Title Pt will be able to improve bilat LE strength to be able to perform 10 squats    Baseline Unable    Time 3    Period Weeks    Status New    Target Date 10/04/19      PT SHORT TERM GOAL #4   Title Pt will be able to tolerate sitting at least 20 minutes without discomfort    Baseline tolerates ~15 min    Time 3    Period Weeks    Status New    Target Date 10/04/19      PT SHORT TERM GOAL #5   Title --    Time --    Period --    Status --    Target Date --             PT Long Term Goals - 09/13/19 1654      PT LONG TERM GOAL #1   Title Pt will be independent with advanced HEP     Time 6    Period Weeks    Status New    Target Date 10/25/19      PT LONG TERM GOAL #2   Title Pt will be able to walk ~1 mile without discomfort    Baseline Only able to comfortably walk a football field length    Time 6    Period Weeks    Status New    Target Date 10/25/19      PT LONG TERM GOAL #3   Title Pt will be able to sit at least 30 minutes without discomfort    Time 6    Period Weeks    Status New    Target Date 10/25/19      PT LONG TERM GOAL #4   Title Pt will be able to tolerate standing at least 30 minutes without discomfort    Time 6    Period Weeks    Status New    Target Date 10/25/19      PT LONG TERM GOAL #5   Title Pt will improve her FOTO score to </=40%    Baseline 56%    Time 6    Period Weeks    Status New    Target Date 10/25/19                 Plan - 09/27/19 1509    Clinical Impression Statement Pt initially felt some radicular symptoms down LLE on Nustep that began to dissipate after a few minutes. Increased fascial restrictions in L upper glutes and piriformis noted, along with L lumbar paraspinals. Added open books and supine marching in pelvic tilt to home program.    Personal Factors and Comorbidities Age;Comorbidity 1;Comorbidity 2    Comorbidities anxiety, GI disease, HTN, visual impairment    Examination-Activity Limitations  Bend;Carry;Lift;Squat;Sit;Stand;Stairs;Locomotion Level    Examination-Participation Restrictions Cleaning;Community Activity;Driving;Laundry;Shop    PT Frequency 2x / week    PT Duration 6 weeks    PT Treatment/Interventions ADLs/Self Care Home Management;Aquatic Therapy;Cryotherapy;Electrical Stimulation;Iontophoresis 47m/ml Dexamethasone;Moist Heat;Traction;Gait training;Stair training;Functional mobility training;Therapeutic activities;Therapeutic exercise;Balance training;Neuromuscular re-education;Patient/family education;Manual techniques;Passive range of motion;Dry needling;Taping;Spinal  Manipulations;Joint Manipulations    PT Next Visit Plan Assess response to HEP   Reassess pelvis for possible need of MET. Continue to progress core/hip strengthening and hip stretching    PT Home Exercise Plan Access Code: [email or web address]    Consulted and Agree with Plan of Care Patient           Patient will benefit from skilled therapeutic intervention in order to improve the following deficits and impairments:  Decreased range of motion, Increased fascial restricitons, Pain, Hypomobility, Impaired flexibility, Improper body mechanics, Decreased mobility, Decreased strength, Postural dysfunction  Visit Diagnosis: Chronic bilateral low back pain with left-sided sciatica  Abnormal posture  Other abnormalities of gait and mobility  Dizziness and giddiness     Problem List Patient Active Problem List   Diagnosis Date Noted  . Dyslipidemia 12/09/2017  . Class 1 obesity due to excess calories with serious comorbidity and body mass index (BMI) of 30.0 to 30.9 in adult 12/09/2017  . Muscle cramps 12/09/2017  . Osteopenia of right hip 08/02/2017  . Gastroesophageal reflux disease without esophagitis 07/28/2017  . Irritable bowel syndrome with diarrhea 09/18/2016  . Urge incontinence of urine 09/18/2016  . Inflamed external hemorrhoid 07/24/2016  . Varicose veins of right lower extremity with complications 183/43/7357 . Essential hypertension 08/10/2015  . Pure hypercholesterolemia 08/10/2015  . Glucose intolerance (impaired glucose tolerance) 08/10/2015    KIzell Carolina9/16/2021, 4:59 PM  [facility] [address]Phone: [phone]  Fax:  [phone] Name: BDENESIA DONELANMRN: [id number] of Birth: 7May 05, 1952

## 2019-10-02 ENCOUNTER — Ambulatory Visit: Payer: Medicare Other | Admitting: Physical Therapy

## 2019-10-04 ENCOUNTER — Other Ambulatory Visit: Payer: Self-pay

## 2019-10-04 ENCOUNTER — Ambulatory Visit: Payer: Medicare Other | Admitting: Physical Therapy

## 2019-10-04 ENCOUNTER — Encounter: Payer: Self-pay | Admitting: Physical Therapy

## 2019-10-04 DIAGNOSIS — R293 Abnormal posture: Secondary | ICD-10-CM

## 2019-10-04 DIAGNOSIS — R2689 Other abnormalities of gait and mobility: Secondary | ICD-10-CM

## 2019-10-04 DIAGNOSIS — M5442 Lumbago with sciatica, left side: Secondary | ICD-10-CM

## 2019-10-04 NOTE — Therapy (Signed)
[place], [place], [address] Phone: [phone]   Fax:  [phone]  Physical Therapy Treatment  Patient Details  Name: [name] MRN: [id number] Date of Birth: [birth year] Referring Provider (PT): Rutherford Guys, MD   Encounter Date: 10/04/2019   PT End of Session - 10/04/19 1319    Visit Number 4    Number of Visits 12    Date for PT Re-Evaluation 10/25/19    PT Start Time 1319    PT Stop Time 1400    PT Time Calculation (min) 41 min    Activity Tolerance Patient tolerated treatment well    Behavior During Therapy [facility] for tasks assessed/performed           Past Medical History:  Diagnosis Date  . Allergy    Zyrtec PRN  . Anemia   . Cataract   . Colon polyp 03/11/2007  . Diverticulosis    by colonoscopy 2009  . Fibromyalgia    diagnosed by Dr. Teressa Lower. s/p rheumatology consultation  . GERD (gastroesophageal reflux disease)   . Hyperlipidemia   . Hypertension   . IBS (irritable bowel syndrome)    diarrhea, constipation alternating; s/p GI consult.  . Internal hemorrhoid    colonoscopy in 2009    Past Surgical History:  Procedure Laterality Date  . CESAREAN SECTION    . COLONOSCOPY    . ENDOVENOUS ABLATION SAPHENOUS VEIN W/ LASER Right 08/17/2016   endovenous laser ablation right greater saphenous vein by Tinnie Gens MD   . MYOMECTOMY    . TUBAL LIGATION    . UPPER GASTROINTESTINAL ENDOSCOPY      There were no vitals filed for this visit.   Subjective Assessment - 10/04/19 1322    Subjective Pt reports one of the stretches (knee to chest) on more than one occasion caused dizziness while performing it on the floor. Pt states that she does feel that it's getting better and a little less pain. Pt reports improved sitting tolerance; however, still has sharp back/hip pains at night while laying down.    Pertinent History Osteopenia, bulging discs,    Limitations  Sitting;Lifting;Standing;Walking    Patient Stated Goals Reduce pain; improve strength for activities such as hiking and exercising    Currently in Pain? Yes    Pain Score 5     Pain Location Leg    Pain Orientation Left;Right    Pain Descriptors / Indicators Aching    Pain Type Chronic pain    Pain Onset More than a month ago                             [facility] Adult PT Treatment/Exercise - 10/04/19 0001      Lumbar Exercises: Stretches   Passive Hamstring Stretch Right;Left;30 seconds    Pelvic Tilt 10 reps      Lumbar Exercises: Aerobic   Nustep L5 x 6 min BUE/LE      Lumbar Exercises: Standing   Wall Slides 10 reps    Other Standing Lumbar Exercises PPT against wall x10    Other Standing Lumbar Exercises Mini squats x10      Lumbar Exercises: Seated   Other Seated Lumbar Exercises on dynadisk: PPT x10, marching x10, pelvic lateral tilting x10      Lumbar Exercises: Supine   Clam 20 reps    Clam Limitations with PPT & green tband  Bridge 20 reps    Other Supine Lumbar Exercises bilat knee bend with alternating heel taps x10      Manual Therapy   Manual Therapy Muscle Energy Technique    Muscle Energy Technique SI MET to correct R pelvic anterior tilt                    PT Short Term Goals - 10/04/19 1324      PT SHORT TERM GOAL #1   Title Pt will be independent with initial HEP    Baseline Newly provided    Time 3    Period Weeks    Status Achieved    Target Date 10/04/19      PT SHORT TERM GOAL #2   Title Pt will report no radiating pain sensation down her legs for improved pt comfort    Time 3    Period Weeks    Status On-going    Target Date 10/04/19      PT SHORT TERM GOAL #3   Title Pt will be able to improve bilat LE strength to be able to perform 10 squats    Baseline Unable    Time 3    Period Weeks    Status Partially Met    Target Date 10/04/19      PT SHORT TERM GOAL #4   Title Pt will be able to tolerate  sitting at least 20 minutes without discomfort    Baseline tolerates ~15 min    Time 3    Period Weeks    Status Achieved    Target Date 10/04/19             PT Long Term Goals - 09/13/19 1654      PT LONG TERM GOAL #1   Title Pt will be independent with advanced HEP    Time 6    Period Weeks    Status New    Target Date 10/25/19      PT LONG TERM GOAL #2   Title Pt will be able to walk ~1 mile without discomfort    Baseline Only able to comfortably walk a football field length    Time 6    Period Weeks    Status New    Target Date 10/25/19      PT LONG TERM GOAL #3   Title Pt will be able to sit at least 30 minutes without discomfort    Time 6    Period Weeks    Status New    Target Date 10/25/19      PT LONG TERM GOAL #4   Title Pt will be able to tolerate standing at least 30 minutes without discomfort    Time 6    Period Weeks    Status New    Target Date 10/25/19      PT LONG TERM GOAL #5   Title Pt will improve her FOTO score to </=40%    Baseline 56%    Time 6    Period Weeks    Status New    Target Date 10/25/19                 Plan - 10/04/19 1421    Clinical Impression Statement Pt tolerated treatment well. Treatment focused on stretching and strengthening bilat LE and core. Initiated pelvic tilting in sitting and standing. Pt is slowly meeting STG but still has "aches" down bilat LE. Pt found to have anteriorly rotated  R LE; performed MET and retested supine to long sit and pt found to have better alignment.    Personal Factors and Comorbidities Age;Comorbidity 1;Comorbidity 2    Comorbidities anxiety, GI disease, HTN, visual impairment    Examination-Activity Limitations Bend;Carry;Lift;Squat;Sit;Stand;Stairs;Locomotion Level    Examination-Participation Restrictions Cleaning;Community Activity;Driving;Laundry;Shop    PT Frequency 2x / week    PT Duration 6 weeks    PT Treatment/Interventions ADLs/Self Care Home Management;Aquatic  Therapy;Cryotherapy;Electrical Stimulation;Iontophoresis 46m/ml Dexamethasone;Moist Heat;Traction;Gait training;Stair training;Functional mobility training;Therapeutic activities;Therapeutic exercise;Balance training;Neuromuscular re-education;Patient/family education;Manual techniques;Passive range of motion;Dry needling;Taping;Spinal Manipulations;Joint Manipulations    PT Next Visit Plan Assess response to HEP. Manual therapy if indicated. Continue to progress core/hip strengthening and hip stretching    PT Home Exercise Plan Access Code: [email or web address]. SI MET self correction, hamstring stretch, bridging, bent leg raise with toe tap    Consulted and Agree with Plan of Care Patient           Patient will benefit from skilled therapeutic intervention in order to improve the following deficits and impairments:  Decreased range of motion, Increased fascial restricitons, Pain, Hypomobility, Impaired flexibility, Improper body mechanics, Decreased mobility, Decreased strength, Postural dysfunction  Visit Diagnosis: Chronic bilateral low back pain with left-sided sciatica  Abnormal posture  Other abnormalities of gait and mobility     Problem List Patient Active Problem List   Diagnosis Date Noted  . Dyslipidemia 12/09/2017  . Class 1 obesity due to excess calories with serious comorbidity and body mass index (BMI) of 30.0 to 30.9 in adult 12/09/2017  . Muscle cramps 12/09/2017  . Osteopenia of right hip 08/02/2017  . Gastroesophageal reflux disease without esophagitis 07/28/2017  . Irritable bowel syndrome with diarrhea 09/18/2016  . Urge incontinence of urine 09/18/2016  . Inflamed external hemorrhoid 07/24/2016  . Varicose veins of right lower extremity with complications 103/50/0938 . Essential hypertension 08/10/2015  . Pure hypercholesterolemia 08/10/2015  . Glucose intolerance (impaired glucose tolerance) 08/10/2015    Radford Pease April MGordy Levan9/23/2021, 10:18 PM  [facility] [address]Phone: [phone]  Fax:  [phone] Name: Stefanie NAPPIERMRN: [id number] of Birth: 7September 10, 1952

## 2019-10-09 ENCOUNTER — Encounter: Payer: Self-pay | Admitting: Physical Therapy

## 2019-10-09 ENCOUNTER — Other Ambulatory Visit: Payer: Self-pay

## 2019-10-09 ENCOUNTER — Ambulatory Visit: Payer: Medicare Other | Admitting: Physical Therapy

## 2019-10-09 DIAGNOSIS — R2689 Other abnormalities of gait and mobility: Secondary | ICD-10-CM

## 2019-10-09 DIAGNOSIS — R42 Dizziness and giddiness: Secondary | ICD-10-CM

## 2019-10-09 DIAGNOSIS — R293 Abnormal posture: Secondary | ICD-10-CM

## 2019-10-09 DIAGNOSIS — G8929 Other chronic pain: Secondary | ICD-10-CM

## 2019-10-09 DIAGNOSIS — M5442 Lumbago with sciatica, left side: Secondary | ICD-10-CM | POA: Diagnosis not present

## 2019-10-09 NOTE — Patient Instructions (Signed)
° °  [name], PT, [place] Certified Exercise Expert for the Aging Adult  10/09/19 3:45 PM Phone: [phone] Fax: [phone]

## 2019-10-09 NOTE — Therapy (Signed)
[place], [place], [address] Phone: [phone]   Fax:  [phone]  Physical Therapy Treatment  Patient Details  Name: [name] MRN: [id number] Date of Birth: [birth year] Referring Provider (PT): Rutherford Guys, MD   Encounter Date: 10/09/2019   PT End of Session - 10/09/19 1511    Visit Number 5    Number of Visits 12    Date for PT Re-Evaluation 10/25/19    PT Start Time 1505    PT Stop Time 1602    PT Time Calculation (min) 57 min    Activity Tolerance Patient tolerated treatment well    Behavior During Therapy [facility] for tasks assessed/performed           Past Medical History:  Diagnosis Date  . Allergy    Zyrtec PRN  . Anemia   . Cataract   . Colon polyp 03/11/2007  . Diverticulosis    by colonoscopy 2009  . Fibromyalgia    diagnosed by Dr. Teressa Lower. s/p rheumatology consultation  . GERD (gastroesophageal reflux disease)   . Hyperlipidemia   . Hypertension   . IBS (irritable bowel syndrome)    diarrhea, constipation alternating; s/p GI consult.  . Internal hemorrhoid    colonoscopy in 2009    Past Surgical History:  Procedure Laterality Date  . CESAREAN SECTION    . COLONOSCOPY    . ENDOVENOUS ABLATION SAPHENOUS VEIN W/ LASER Right 08/17/2016   endovenous laser ablation right greater saphenous vein by Tinnie Gens MD   . MYOMECTOMY    . TUBAL LIGATION    . UPPER GASTROINTESTINAL ENDOSCOPY      There were no vitals filed for this visit.   Subjective Assessment - 10/09/19 1508    Subjective I am not as consistent with my exercise as I need to do it. My pain is worse at night and I use my heating pad.  I take pressure off my left side    Pertinent History Osteopenia, bulging discs,    Limitations Sitting;Lifting;Standing;Walking    How long can you sit comfortably? about 30 to 45 min    How long can you stand comfortably? about 20 minutes    How long can you walk  comfortably? Left leg at times buckles; 2 blocks    Patient Stated Goals Reduce pain; improve strength for activities such as hiking and exercising    Currently in Pain? Yes    Pain Score 6    7 at night   Pain Location Leg    Pain Orientation Left;Right   LT worse than RT   Pain Descriptors / Indicators Aching    Pain Type Chronic pain    Pain Onset More than a month ago                             [facility] Adult PT Treatment/Exercise - 10/09/19 0001      Lumbar Exercises: Stretches   Passive Hamstring Stretch Right;Left;30 seconds    Pelvic Tilt 10 reps      Lumbar Exercises: Standing   Other Standing Lumbar Exercises standing back extension next to counter  hip hinge with dowel witt x 20,, then with wall as external cue x 20  pt needed VC for decreaed of back and increase use of hips with hip hinge.  Pt was able to do well at end of session.     Other Standing  Lumbar Exercises Mini squats x10      Lumbar Exercises: Seated   Other Seated Lumbar Exercises  PPT x10, marching x10, pelvic lateral tilting x10      Lumbar Exercises: Prone   Other Prone Lumbar Exercises prone press up x 5        Modalities   Modalities Moist Heat      Moist Heat Therapy   Number Minutes Moist Heat 12 Minutes    Moist Heat Location Lumbar Spine      Manual Therapy   Manual Therapy Joint mobilization;Soft tissue mobilization    Joint Mobilization Lunbar PA, sidelying lumbar thrust to RT/LT  also with MEt with trunk roation to RT and LT              Trigger Point Dry Needling - 10/09/19 0001    Consent Given? Yes    Education Handout Provided Previously provided    Muscles Treated Back/Hip Gluteus medius;Gluteus maximus;Lumbar multifidi   bil   Dry Needling Comments 100 mm .40 gage    Gluteus Medius Response Twitch response elicited;Palpable increased muscle length    Gluteus Maximus Response Twitch response elicited;Palpable increased muscle length    Lumbar multifidi  Response Twitch response elicited   L-5  s 1               PT Education - 10/09/19 1712    Education Details added hip hinge to HEP    Person(s) Educated Patient    Methods Explanation;Demonstration;Tactile cues;Verbal cues;Handout    Comprehension Verbalized understanding;Returned demonstration            PT Short Term Goals - 10/09/19 1716      PT SHORT TERM GOAL #1   Title Pt will be independent with initial HEP    Baseline Pt reports not being as consistent with exercise , trying to get into routine    Time 3    Period Weeks    Status On-going    Target Date 10/04/19      PT SHORT TERM GOAL #2   Title Pt will report no radiating pain sensation down her legs for improved pt comfort    Baseline radiating improved with treatment  today    Time 3    Period Weeks    Status On-going      PT SHORT TERM GOAL #3   Title Pt will be able to improve bilat LE strength to be able to perform 10 squats    Baseline Pt abale to do 20 x 2 hip hinge    Time 3    Period Weeks    Status Partially Met      PT SHORT TERM GOAL #4   Title Pt will be able to tolerate sitting at least 20 minutes without discomfort    Baseline sitting 30 to 45 min    Time 3    Period Weeks    Status Achieved             PT Long Term Goals - 10/09/19 1718      PT LONG TERM GOAL #1   Title Pt will be independent with advanced HEP    Time 6    Period Weeks    Status On-going      PT LONG TERM GOAL #2   Title Pt will be able to walk ~1 mile without discomfort    Baseline can walk 2 blocks    Time 6    Period Weeks  Status On-going      PT LONG TERM GOAL #3   Title Pt will be able to sit at least 30 minutes without discomfort    Baseline 30to 45 min    Time 6    Period Weeks    Status On-going      PT LONG TERM GOAL #4   Title Pt will be able to tolerate standing at least 30 minutes without discomfort    Baseline unable to stand 15 min or more    Time 6    Period Weeks    Status  On-going      PT LONG TERM GOAL #5   Title Pt will improve her FOTO score to </=40%    Baseline 56%    Time 6    Period Weeks    Status Unable to assess          added to HEP  Access Code: [email or web address]: [email or web address]: 09/28/2021Prepared by: Donnetta Simpers BeardsleyExercises   Standing Hip Hinge with Dowel - 1 x daily - 7 x weekly - 3 sets - 10 reps       Plan - 10/09/19 1508    Clinical Impression Statement Pt with 6/10 pain and shooting down legs.  Pt admits she does not do exercisees as consistently and needs to find a routinel  worked on hip hinging and good body mechanics today . pt requested TPDN and was closely monitored throughout session. Pt with some relief during RX ,but reports she did feel better last time but her pain in legs retruned. this is second TPDN  Will monitor but encourage consistent HEP    Personal Factors and Comorbidities Age;Comorbidity 1;Comorbidity 2    Comorbidities anxiety, GI disease, HTN, visual impairment    Examination-Activity Limitations Bend;Carry;Lift;Squat;Sit;Stand;Stairs;Locomotion Level    Examination-Participation Restrictions Cleaning;Community Activity;Driving;Laundry;Shop    Stability/Clinical Decision Making Evolving/Moderate complexity    Rehab Potential Good    PT Frequency 2x / week    PT Duration 6 weeks    PT Treatment/Interventions ADLs/Self Care Home Management;Aquatic Therapy;Cryotherapy;Electrical Stimulation;Iontophoresis 31m/ml Dexamethasone;Moist Heat;Traction;Gait training;Stair training;Functional mobility training;Therapeutic activities;Therapeutic exercise;Balance training;Neuromuscular re-education;Patient/family education;Manual techniques;Passive range of motion;Dry needling;Taping;Spinal Manipulations;Joint Manipulations    PT Next Visit Plan Assess response to HEP. Manual therapy if indicated. Continue to progress core/hip strengthening and hip stretching FOTO schore next visit    PT Home Exercise  Plan Access Code: [email or web address]. SI MET self correction, hamstring stretch, bridging, bent leg raise with toe tap    Consulted and Agree with Plan of Care Patient           Patient will benefit from skilled therapeutic intervention in order to improve the following deficits and impairments:  Decreased range of motion, Increased fascial restricitons, Pain, Hypomobility, Impaired flexibility, Improper body mechanics, Decreased mobility, Decreased strength, Postural dysfunction  Visit Diagnosis: Chronic bilateral low back pain with left-sided sciatica  Abnormal posture  Other abnormalities of gait and mobility  Dizziness and giddiness     Problem List Patient Active Problem List   Diagnosis Date Noted  . Dyslipidemia 12/09/2017  . Class 1 obesity due to excess calories with serious comorbidity and body mass index (BMI) of 30.0 to 30.9 in adult 12/09/2017  . Muscle cramps 12/09/2017  . Osteopenia of right hip 08/02/2017  . Gastroesophageal reflux disease without esophagitis 07/28/2017  . Irritable bowel syndrome with diarrhea 09/18/2016  . Urge incontinence of urine 09/18/2016  . Inflamed external hemorrhoid 07/24/2016  . Varicose veins of right lower extremity with complications 188/87/5797 .  Essential hypertension 08/10/2015  . Pure hypercholesterolemia 08/10/2015  . Glucose intolerance (impaired glucose tolerance) 08/10/2015    Voncille Lo, PT, [place] Certified Exercise Expert for the Aging Adult  10/09/19 5:23 PM Phone: [phone] Fax: [facility] [address] [place], [place], [address] Phone: [phone]   Fax:  [phone]  Name: [name] MRN: [id number] Date of Birth: [birth year]

## 2019-10-11 ENCOUNTER — Ambulatory Visit: Payer: Medicare Other

## 2019-10-26 ENCOUNTER — Ambulatory Visit: Payer: Medicare Other | Admitting: Cardiology

## 2019-10-30 ENCOUNTER — Other Ambulatory Visit: Payer: Self-pay

## 2019-10-30 ENCOUNTER — Ambulatory Visit: Payer: Medicare Other | Attending: Family Medicine

## 2019-10-30 DIAGNOSIS — R293 Abnormal posture: Secondary | ICD-10-CM | POA: Diagnosis present

## 2019-10-30 DIAGNOSIS — G8929 Other chronic pain: Secondary | ICD-10-CM | POA: Diagnosis present

## 2019-10-30 DIAGNOSIS — R2689 Other abnormalities of gait and mobility: Secondary | ICD-10-CM

## 2019-10-30 DIAGNOSIS — M5442 Lumbago with sciatica, left side: Secondary | ICD-10-CM | POA: Diagnosis not present

## 2019-10-30 NOTE — Therapy (Addendum)
[place], [place], [address] Phone: [phone]   Fax:  [phone]  Physical Therapy Treatment/Discharge  Patient Details  Name: [name] MRN: [id number] Date of Birth: [birth year] Referring Provider (PT): Rutherford Guys, MD   Encounter Date: 10/30/2019   PT End of Session - 10/30/19 1633    Visit Number 6    Number of Visits 12    Date for PT Re-Evaluation 10/25/19    PT Start Time 9937   pt arrived late   PT Stop Time 1630    PT Time Calculation (min) 38 min    Activity Tolerance Patient tolerated treatment well    Behavior During Therapy [facility] for tasks assessed/performed           Past Medical History:  Diagnosis Date  . Allergy    Zyrtec PRN  . Anemia   . Cataract   . Colon polyp 03/11/2007  . Diverticulosis    by colonoscopy 2009  . Fibromyalgia    diagnosed by Dr. Teressa Lower. s/p rheumatology consultation  . GERD (gastroesophageal reflux disease)   . Hyperlipidemia   . Hypertension   . IBS (irritable bowel syndrome)    diarrhea, constipation alternating; s/p GI consult.  . Internal hemorrhoid    colonoscopy in 2009    Past Surgical History:  Procedure Laterality Date  . CESAREAN SECTION    . COLONOSCOPY    . ENDOVENOUS ABLATION SAPHENOUS VEIN W/ LASER Right 08/17/2016   endovenous laser ablation right greater saphenous vein by Tinnie Gens MD   . MYOMECTOMY    . TUBAL LIGATION    . UPPER GASTROINTESTINAL ENDOSCOPY      There were no vitals filed for this visit.   Subjective Assessment - 10/30/19 1555    Subjective Pt reports she has not been in since she has been dealing with vertigo. She has not been able to do exercises like she wants to because of the vertigo, but her back is feeling better. She can't shift to her R bc of the vertigo. She took a tylenol earlier bc it was a little rough.    Pertinent History Osteopenia, bulging discs,    Limitations  Sitting;Lifting;Standing;Walking    How long can you sit comfortably? about 30 to 45 min    How long can you stand comfortably? about 20 minutes    How long can you walk comfortably? Left leg at times buckles; 2 blocks    Patient Stated Goals Reduce pain; improve strength for activities such as hiking and exercising    Currently in Pain? No/denies    Pain Onset More than a month ago                             [facility] Adult PT Treatment/Exercise - 10/30/19 0001      Lumbar Exercises: Standing   Other Standing Lumbar Exercises open books at wall, pelvic tilts, squats on wall with PPT      Lumbar Exercises: Seated   Other Seated Lumbar Exercises T/S ext over chair with pillow, HBH      Lumbar Exercises: Supine   Other Supine Lumbar Exercises LTR to R in supine                    PT Short Term Goals - 10/09/19 1716      PT SHORT TERM GOAL #1   Title Pt will be  independent with initial HEP    Baseline Pt reports not being as consistent with exercise , trying to get into routine    Time 3    Period Weeks    Status On-going    Target Date 10/04/19      PT SHORT TERM GOAL #2   Title Pt will report no radiating pain sensation down her legs for improved pt comfort    Baseline radiating improved with treatment  today    Time 3    Period Weeks    Status On-going      PT SHORT TERM GOAL #3   Title Pt will be able to improve bilat LE strength to be able to perform 10 squats    Baseline Pt abale to do 20 x 2 hip hinge    Time 3    Period Weeks    Status Partially Met      PT SHORT TERM GOAL #4   Title Pt will be able to tolerate sitting at least 20 minutes without discomfort    Baseline sitting 30 to 45 min    Time 3    Period Weeks    Status Achieved             PT Long Term Goals - 10/09/19 1718      PT LONG TERM GOAL #1   Title Pt will be independent with advanced HEP    Time 6    Period Weeks    Status On-going      PT LONG TERM GOAL  #2   Title Pt will be able to walk ~1 mile without discomfort    Baseline can walk 2 blocks    Time 6    Period Weeks    Status On-going      PT LONG TERM GOAL #3   Title Pt will be able to sit at least 30 minutes without discomfort    Baseline 30to 45 min    Time 6    Period Weeks    Status On-going      PT LONG TERM GOAL #4   Title Pt will be able to tolerate standing at least 30 minutes without discomfort    Baseline unable to stand 15 min or more    Time 6    Period Weeks    Status On-going      PT LONG TERM GOAL #5   Title Pt will improve her FOTO score to </=40%    Baseline 56%    Time 6    Period Weeks    Status Unable to assess                 Plan - 10/30/19 1633    Clinical Impression Statement Pt presents with intermittent vertigo symptoms the last few weeks. She was unable to perform any R S/L and limited in supine, so performed all exercises in standing and sitting prescribing new HEP for better tolerance. Pt agreeable to continuing at home for awhile until she gets her vertigo under control. She plans to see an ENT.    Personal Factors and Comorbidities Age;Comorbidity 1;Comorbidity 2    Comorbidities anxiety, GI disease, HTN, visual impairment    Examination-Activity Limitations Bend;Carry;Lift;Squat;Sit;Stand;Stairs;Locomotion Level    Examination-Participation Restrictions Cleaning;Community Activity;Driving;Laundry;Shop    Stability/Clinical Decision Making Evolving/Moderate complexity    Rehab Potential Good    PT Frequency 2x / week    PT Duration 6 weeks    PT Treatment/Interventions ADLs/Self Care Home  Management;Aquatic Therapy;Cryotherapy;Electrical Stimulation;Iontophoresis 9m/ml Dexamethasone;Moist Heat;Traction;Gait training;Stair training;Functional mobility training;Therapeutic activities;Therapeutic exercise;Balance training;Neuromuscular re-education;Patient/family education;Manual techniques;Passive range of motion;Dry  needling;Taping;Spinal Manipulations;Joint Manipulations    PT Next Visit Plan Assess response to HEP. Manual therapy if indicated. Continue to progress core/hip strengthening and hip stretching FOTO score next visit; hold chart    PT Home Exercise Plan Access Code: [email or web address]. SI MET self correction, hamstring stretch, bridging, bent leg raise with toe tap    Consulted and Agree with Plan of Care Patient           Patient will benefit from skilled therapeutic intervention in order to improve the following deficits and impairments:  Decreased range of motion, Increased fascial restricitons, Pain, Hypomobility, Impaired flexibility, Improper body mechanics, Decreased mobility, Decreased strength, Postural dysfunction  Visit Diagnosis: Chronic bilateral low back pain with left-sided sciatica  Abnormal posture  Other abnormalities of gait and mobility     Problem List Patient Active Problem List   Diagnosis Date Noted  . Dyslipidemia 12/09/2017  . Class 1 obesity due to excess calories with serious comorbidity and body mass index (BMI) of 30.0 to 30.9 in adult 12/09/2017  . Muscle cramps 12/09/2017  . Osteopenia of right hip 08/02/2017  . Gastroesophageal reflux disease without esophagitis 07/28/2017  . Irritable bowel syndrome with diarrhea 09/18/2016  . Urge incontinence of urine 09/18/2016  . Inflamed external hemorrhoid 07/24/2016  . Varicose veins of right lower extremity with complications 158/09/9831 . Essential hypertension 08/10/2015  . Pure hypercholesterolemia 08/10/2015  . Glucose intolerance (impaired glucose tolerance) 08/10/2015    KIzell Bushnell PT, DPT 10/30/2019, 4:45 PM  [facility]GC[address] [place] [address]Phone: [phone]  Fax:  [phone] Name: BSHAVAUGHN SEIDLMRN: [id number] of Birth: [birth year]  PHYSICAL THERAPY DISCHARGE SUMMARY  Visits from Start of Care:  6  Current functional level related to goals / functional outcomes: Unknown   Remaining deficits: unknown   Education / Equipment: HEP  Plan: Patient agrees to discharge.  Patient goals were partially met. Patient is being discharged due to not returning since the last visit.  ?????        KPhill Myron TYvette Rack PT, DPT

## 2019-11-05 ENCOUNTER — Other Ambulatory Visit: Payer: Self-pay

## 2019-11-05 ENCOUNTER — Encounter: Payer: Self-pay | Admitting: Registered Nurse

## 2019-11-05 ENCOUNTER — Ambulatory Visit (INDEPENDENT_AMBULATORY_CARE_PROVIDER_SITE_OTHER): Payer: Medicare Other | Admitting: Registered Nurse

## 2019-11-05 VITALS — BP 146/77 | HR 71 | Temp 98.2°F | Resp 18 | Ht 61.0 in | Wt 167.2 lb

## 2019-11-05 DIAGNOSIS — R42 Dizziness and giddiness: Secondary | ICD-10-CM

## 2019-11-05 MED ORDER — MECLIZINE HCL 12.5 MG PO TABS: 12.5000 mg | ORAL_TABLET | Freq: Two times a day (BID) | ORAL | 0 refills | Status: DC | PRN

## 2019-11-05 NOTE — Patient Instructions (Signed)
° ° ° °  If you have lab work done today you will be contacted with your lab results within the next 2 weeks.  If you have not heard from us then please contact us. The fastest way to get your results is to register for My Chart. ° ° °IF you received an x-ray today, you will receive an invoice from [facility]. Please contact [facility] at [phone] with questions or concerns regarding your invoice.  ° °IF you received labwork today, you will receive an invoice from [facility]. Please contact [facility] at [phone] with questions or concerns regarding your invoice.  ° °Our billing staff will not be able to assist you with questions regarding bills from these companies. ° °You will be contacted with the lab results as soon as they are available. The fastest way to get your results is to activate your My Chart account. Instructions are located on the last page of this paperwork. If you have not heard from us regarding the results in 2 weeks, please contact this office. °  ° ° ° °

## 2019-11-06 LAB — COMPREHENSIVE METABOLIC PANEL
ALT: 10 IU/L (ref 0–32)
AST: 11 IU/L (ref 0–40)
Albumin/Globulin Ratio: 1.6 (ref 1.2–2.2)
Albumin: 4.2 g/dL (ref 3.8–4.8)
Alkaline Phosphatase: 61 IU/L (ref 44–121)
BUN/Creatinine Ratio: 8 — ABNORMAL LOW (ref 12–28)
BUN: 7 mg/dL — ABNORMAL LOW (ref 8–27)
Bilirubin Total: 0.5 mg/dL (ref 0.0–1.2)
CO2: 21 mmol/L (ref 20–29)
Calcium: 9.5 mg/dL (ref 8.7–10.3)
Chloride: 105 mmol/L (ref 96–106)
Creatinine, Ser: 0.93 mg/dL (ref 0.57–1.00)
GFR calc Af Amer: 72 mL/min/{1.73_m2} (ref >59)
GFR calc non Af Amer: 62 mL/min/{1.73_m2} (ref >59)
Globulin, Total: 2.7 g/dL (ref 1.5–4.5)
Glucose: 107 mg/dL — ABNORMAL HIGH (ref 65–99)
Potassium: 3.9 mmol/L (ref 3.5–5.2)
Sodium: 143 mmol/L (ref 134–144)
Total Protein: 6.9 g/dL (ref 6.0–8.5)

## 2019-11-06 LAB — TSH: TSH: 1.78 u[IU]/mL (ref 0.450–4.500)

## 2019-11-08 ENCOUNTER — Encounter: Payer: Self-pay | Admitting: Registered Nurse

## 2019-11-12 ENCOUNTER — Encounter: Payer: Self-pay | Admitting: Registered Nurse

## 2019-11-12 NOTE — Progress Notes (Signed)
Acute Office Visit  Subjective:    Patient ID: [name], female    DOB: 13-Jul-1950, 69 y.o.   MRN: [id number]  Chief Complaint  Patient presents with  . Dizziness    Patient states she has still been very dizzy due to Vertigo. Per patient she has not been able to take any of the medication due too having alot going on for the past 3 days. EMS was called but she was not taken to the hospital.    HPI Patient is in today for dizziness.  Hx of vertigo. Has been off of her medications as she has been very busy for these past few days. She did call EMS but was not taken to ER - stabilized and remained at home Has noted that BP has been a little higher than usual - feels this is because she has been noncompliant with meds. Denies chest pain, shob, doe, palpitations, claudication, and dependent edema.   Otherwise feeling well. No new or changing neuro symptoms beyond exacerbation of vertigo.  Past Medical History:  Diagnosis Date  . Allergy    Zyrtec PRN  . Anemia   . Cataract   . Colon polyp 03/11/2007  . Diverticulosis    by colonoscopy 2009  . Fibromyalgia    diagnosed by Dr. Teressa Lower. s/p rheumatology consultation  . GERD (gastroesophageal reflux disease)   . Hyperlipidemia   . Hypertension   . IBS (irritable bowel syndrome)    diarrhea, constipation alternating; s/p GI consult.  . Internal hemorrhoid    colonoscopy in 2009    Past Surgical History:  Procedure Laterality Date  . CESAREAN SECTION    . COLONOSCOPY    . ENDOVENOUS ABLATION SAPHENOUS VEIN W/ LASER Right 08/17/2016   endovenous laser ablation right greater saphenous vein by Tinnie Gens MD   . MYOMECTOMY    . TUBAL LIGATION    . UPPER GASTROINTESTINAL ENDOSCOPY      Family History  Problem Relation Age of Onset  . Hypertension Mother   . Stroke Mother        multiple CVAs/TIAs  . Heart disease Mother 14       CHF  . Cancer Father        prostate cancer  . Hypertension Sister   .  Hyperlipidemia Sister   . Glaucoma Sister   . Hypertension Brother   . Prostatitis Brother   . Hypertension Maternal Grandmother   . Hypertension Maternal Grandfather   . Hypertension Sister   . Esophageal cancer Neg Hx   . Rectal cancer Neg Hx   . Stomach cancer Neg Hx     Social History   Socioeconomic History  . Marital status: Married    Spouse name: Elisabeth Cara  . Number of children: 3  . Years of education: Not on file  . Highest education level: Some college, no degree  Occupational History  . Occupation: retired  Tobacco Use  . Smoking status: Former Smoker    Packs/day: 2.00    Years: 27.00    Pack years: 54.00    Quit date: 10/25/1994    Years since quitting: 25.0  . Smokeless tobacco: Never Used  Vaping Use  . Vaping Use: Never used  Substance and Sexual Activity  . Alcohol use: No    Alcohol/week: 0.0 standard drinks  . Drug use: No  . Sexual activity: Yes    Birth control/protection: Post-menopausal    Comment: 1st intercourse 23 yo-1 partner  Other Topics  Concern  . Not on file  Social History Narrative   Marital status: married x 1969; happily married; no abuse     Children: 3 children; 1 grandchildren (53yo).      Lives: with husband, son, daughter      Employment:  Homemaker      Tobacco: quit smoking in 1997; smoked x 30 years.      Alcohol: none      Drugs: none      Exercise: walking three days per week 2019      Seatbelt: 100%     Guns:  None     ADLs: independent with ADLs; no assistant device      Advance Directives: none; desires FULL CODE; no prolonged measures.      Patient is right-handed. She walks 3 x a week.    Social Determinants of Health   Financial Resource Strain:   . Difficulty of Paying Living Expenses: Not on file  Food Insecurity:   . Worried About Charity fundraiser in the Last Year: Not on file  . Ran Out of Food in the Last Year: Not on file  Transportation Needs:   . Lack of Transportation (Medical): Not on file    . Lack of Transportation (Non-Medical): Not on file  Physical Activity:   . Days of Exercise per Week: Not on file  . Minutes of Exercise per Session: Not on file  Stress:   . Feeling of Stress : Not on file  Social Connections:   . Frequency of Communication with Friends and Family: Not on file  . Frequency of Social Gatherings with Friends and Family: Not on file  . Attends Religious Services: Not on file  . Active Member of Clubs or Organizations: Not on file  . Attends Archivist Meetings: Not on file  . Marital Status: Not on file  Intimate Partner Violence:   . Fear of Current or Ex-Partner: Not on file  . Emotionally Abused: Not on file  . Physically Abused: Not on file  . Sexually Abused: Not on file    Outpatient Medications Prior to Visit  Medication Sig Dispense Refill  . ezetimibe (ZETIA) 10 MG tablet Take 1 tablet (10 mg total) by mouth daily after supper. 30 tablet 2  . FLUoxetine (PROZAC) 20 MG capsule Take 1 capsule (20 mg total) by mouth daily. 30 capsule 3  . hydrochlorothiazide (MICROZIDE) 12.5 MG capsule TAKE 1 CAPSULE(12.5 MG) BY MOUTH DAILY 30 capsule 2  . nebivolol (BYSTOLIC) 10 MG tablet Take 1 tablet (10 mg total) by mouth daily. 90 tablet 3  . simvastatin (ZOCOR) 20 MG tablet TAKE 1 TABLET(20 MG) BY MOUTH AT BEDTIME 90 tablet 1   No facility-administered medications prior to visit.    Allergies  Allergen Reactions  . Aspirin Shortness Of Breath  . Pollen Extract     Skin itching, runny eyes, sinus pressure  . Sulfa Antibiotics Itching    itching    Review of Systems Per hpi      Objective:    Physical Exam Vitals and nursing note reviewed.  Constitutional:      Appearance: Normal appearance.  Cardiovascular:     Rate and Rhythm: Normal rate and regular rhythm.     Heart sounds: Normal heart sounds. No murmur heard.  No friction rub. No gallop.   Pulmonary:     Effort: Pulmonary effort is normal. No respiratory distress.      Breath sounds: Normal breath sounds.  No stridor. No wheezing, rhonchi or rales.  Chest:     Chest wall: No tenderness.  Skin:    General: Skin is warm and dry.     Coloration: Skin is not jaundiced or pale.     Findings: No bruising, erythema, lesion or rash.  Neurological:     General: No focal deficit present.     Mental Status: She is alert and oriented to person, place, and time. Mental status is at baseline.  Psychiatric:        Mood and Affect: Mood normal.        Behavior: Behavior normal.        Thought Content: Thought content normal.        Judgment: Judgment normal.     BP (!) 146/77   Pulse 71   Temp 98.2 F (36.8 C) (Temporal)   Resp 18   Ht 5\' 1"  (1.549 m)   Wt 167 lb 3.2 oz (75.8 kg)   SpO2 100%   BMI 31.59 kg/m  Wt Readings from Last 3 Encounters:  11/05/19 167 lb 3.2 oz (75.8 kg)  08/10/19 168 lb (76.2 kg)  08/02/19 170 lb (77.1 kg)    There are no preventive care reminders to display for this patient.  There are no preventive care reminders to display for this patient.   Lab Results  Component Value Date   TSH 1.780 11/05/2019   Lab Results  Component Value Date   WBC 3.5 07/27/2017   HGB 14.0 07/27/2017   HCT 41.9 07/27/2017   MCV 93 07/27/2017   PLT 165 07/27/2017   Lab Results  Component Value Date   NA 143 11/05/2019   K 3.9 11/05/2019   CO2 21 11/05/2019   GLUCOSE 107 (H) 11/05/2019   BUN 7 (L) 11/05/2019   CREATININE 0.93 11/05/2019   BILITOT 0.5 11/05/2019   ALKPHOS 61 11/05/2019   AST 11 11/05/2019   ALT 10 11/05/2019   PROT 6.9 11/05/2019   ALBUMIN 4.2 11/05/2019   CALCIUM 9.5 11/05/2019   ANIONGAP 12 06/09/2017   Lab Results  Component Value Date   CHOL 246 (H) 05/02/2019   Lab Results  Component Value Date   HDL 43 05/02/2019   Lab Results  Component Value Date   LDLCALC 158 (H) 05/02/2019   Lab Results  Component Value Date   TRIG 244 (H) 05/02/2019   Lab Results  Component Value Date   CHOLHDL 5.7 (H)  05/02/2019   Lab Results  Component Value Date   HGBA1C 5.6 07/27/2017       Assessment & Plan:   Problem List Items Addressed This Visit    None    Visit Diagnoses    Dizziness    -  Primary   Relevant Medications   meclizine (ANTIVERT) 12.5 MG tablet   Other Relevant Orders   Comprehensive metabolic panel (Completed)   TSH (Completed)   Ambulatory referral to ENT   Vertigo       Relevant Medications   meclizine (ANTIVERT) 12.5 MG tablet   Other Relevant Orders   Ambulatory referral to ENT       Meds ordered this encounter  Medications  . meclizine (ANTIVERT) 12.5 MG tablet    Sig: Take 1 tablet (12.5 mg total) by mouth 2 (two) times daily as needed for dizziness.    Dispense:  30 tablet    Refill:  0    Order Specific Question:   Supervising Provider  Answer:   GREENE, JEFFREY R [[id number]]   PLAN  Low dose meclizine given for intermittent use. Discussed risks of this medication - largely related to sedation and fall risk. Pt voiced understanding  Discussed at home maneuvers that may help with vertigo  Will refer to ENT for further management given the frequency and severity of her flares  Patient encouraged to call clinic with any questions, comments, or concerns.   Maximiano Coss, NP

## 2019-12-09 ENCOUNTER — Other Ambulatory Visit: Payer: Self-pay | Admitting: Cardiology

## 2019-12-09 DIAGNOSIS — I10 Essential (primary) hypertension: Secondary | ICD-10-CM

## 2019-12-11 ENCOUNTER — Other Ambulatory Visit: Payer: Self-pay

## 2019-12-11 ENCOUNTER — Ambulatory Visit (INDEPENDENT_AMBULATORY_CARE_PROVIDER_SITE_OTHER): Payer: Medicare Other | Admitting: Otolaryngology

## 2019-12-11 DIAGNOSIS — R42 Dizziness and giddiness: Secondary | ICD-10-CM

## 2019-12-11 DIAGNOSIS — J310 Chronic rhinitis: Secondary | ICD-10-CM

## 2019-12-11 NOTE — Progress Notes (Signed)
HPI: [name] is a 69 y.o. female who presents is referred by her PCP for evaluation of vertigo and dizziness.  She apparently has had history of vertigo and dizziness for several months.  She has previously seen neurology and had an MRI scan which was normal.  Her last episode of bouts of vertigo occurred about 2 weeks ago when she was at a store and became unbalanced.  By the time she got to her car she had a vertigo or spinning sensation that lasted for an hour or 2.  Apparently EMS came out to see her and gave her meclizine that helped her get home as she did not want to go to the ER.  The dizziness and vertigo is not really positional although she states that is sometimes worse when she lies on the right side.  She has dizziness when she gets up in the morning where she is unbalanced.  She also describes sometimes pressure in her ears.  She also has occasional ringing in the ears worse on the right side.  She does complain sometimes that she does not hear as well as she thinks she should.. She also complains of "sinus issues" where she has postnasal drainage and some pressure in her head that comes and goes.  Past Medical History:  Diagnosis Date  . Allergy    Zyrtec PRN  . Anemia   . Cataract   . Colon polyp 03/11/2007  . Diverticulosis    by colonoscopy 2009  . Fibromyalgia    diagnosed by Dr. Teressa Lower. s/p rheumatology consultation  . GERD (gastroesophageal reflux disease)   . Hyperlipidemia   . Hypertension   . IBS (irritable bowel syndrome)    diarrhea, constipation alternating; s/p GI consult.  . Internal hemorrhoid    colonoscopy in 2009   Past Surgical History:  Procedure Laterality Date  . CESAREAN SECTION    . COLONOSCOPY    . ENDOVENOUS ABLATION SAPHENOUS VEIN W/ LASER Right 08/17/2016   endovenous laser ablation right greater saphenous vein by Tinnie Gens MD   . MYOMECTOMY    . TUBAL LIGATION    . UPPER GASTROINTESTINAL ENDOSCOPY     Social History    Socioeconomic History  . Marital status: Married    Spouse name: Elisabeth Cara  . Number of children: 3  . Years of education: Not on file  . Highest education level: Some college, no degree  Occupational History  . Occupation: retired  Tobacco Use  . Smoking status: Former Smoker    Packs/day: 2.00    Years: 27.00    Pack years: 54.00    Quit date: 10/25/1994    Years since quitting: 25.1  . Smokeless tobacco: Never Used  Vaping Use  . Vaping Use: Never used  Substance and Sexual Activity  . Alcohol use: No    Alcohol/week: 0.0 standard drinks  . Drug use: No  . Sexual activity: Yes    Birth control/protection: Post-menopausal    Comment: 1st intercourse 69 yo-1 partner  Other Topics Concern  . Not on file  Social History Narrative   Marital status: married x 1969; happily married; no abuse     Children: 3 children; 1 grandchildren (57yo).      Lives: with husband, son, daughter      Employment:  Homemaker      Tobacco: quit smoking in 1997; smoked x 30 years.      Alcohol: none      Drugs: none  Exercise: walking three days per week 2019      Seatbelt: 100%     Guns:  None     ADLs: independent with ADLs; no assistant device      Advance Directives: none; desires FULL CODE; no prolonged measures.      Patient is right-handed. She walks 3 x a week.    Social Determinants of Health   Financial Resource Strain:   . Difficulty of Paying Living Expenses: Not on file  Food Insecurity:   . Worried About Charity fundraiser in the Last Year: Not on file  . Ran Out of Food in the Last Year: Not on file  Transportation Needs:   . Lack of Transportation (Medical): Not on file  . Lack of Transportation (Non-Medical): Not on file  Physical Activity:   . Days of Exercise per Week: Not on file  . Minutes of Exercise per Session: Not on file  Stress:   . Feeling of Stress : Not on file  Social Connections:   . Frequency of Communication with Friends and Family: Not on  file  . Frequency of Social Gatherings with Friends and Family: Not on file  . Attends Religious Services: Not on file  . Active Member of Clubs or Organizations: Not on file  . Attends Archivist Meetings: Not on file  . Marital Status: Not on file   Family History  Problem Relation Age of Onset  . Hypertension Mother   . Stroke Mother        multiple CVAs/TIAs  . Heart disease Mother 106       CHF  . Cancer Father        prostate cancer  . Hypertension Sister   . Hyperlipidemia Sister   . Glaucoma Sister   . Hypertension Brother   . Prostatitis Brother   . Hypertension Maternal Grandmother   . Hypertension Maternal Grandfather   . Hypertension Sister   . Esophageal cancer Neg Hx   . Rectal cancer Neg Hx   . Stomach cancer Neg Hx    Allergies  Allergen Reactions  . Aspirin Shortness Of Breath  . Pollen Extract     Skin itching, runny eyes, sinus pressure  . Sulfa Antibiotics Itching    itching   Prior to Admission medications   Medication Sig Start Date End Date Taking? Authorizing Provider  ezetimibe (ZETIA) 10 MG tablet Take 1 tablet (10 mg total) by mouth daily after supper. 08/10/19 11/08/19  Adrian Prows, MD  FLUoxetine (PROZAC) 20 MG capsule Take 1 capsule (20 mg total) by mouth daily. 08/02/19   Rutherford Guys, MD  hydrochlorothiazide (MICROZIDE) 12.5 MG capsule TAKE 1 CAPSULE(12.5 MG) BY MOUTH DAILY 12/11/19   Adrian Prows, MD  meclizine (ANTIVERT) 12.5 MG tablet Take 1 tablet (12.5 mg total) by mouth 2 (two) times daily as needed for dizziness. 11/05/19   Maximiano Coss, NP  nebivolol (BYSTOLIC) 10 MG tablet Take 1 tablet (10 mg total) by mouth daily. 08/10/19   Adrian Prows, MD  simvastatin (ZOCOR) 20 MG tablet TAKE 1 TABLET(20 MG) BY MOUTH AT BEDTIME 08/10/19   Adrian Prows, MD     Positive ROS: Otherwise negative  All other systems have been reviewed and were otherwise negative with the exception of those mentioned in the HPI and as above.  Physical  Exam: Constitutional: Alert, well-appearing, no acute distress Ears: External ears without lesions or tenderness. Ear canals are clear bilaterally.  TMs are clear with good  mobility pneumatic otoscopy.  Dix-Hallpike testing revealed no clinical evidence of BPPV.  However when she got off the table she fell a little off balance.  But she had no nystagmus and no vertigo.  On tuning fork testing at bedside she had no significant hearing loss with a 1024 tuning fork. Nasal: External nose without lesions. Septum midline with mild rhinitis.. Clear nasal passages bilaterally with clear middle meatus bilaterally. Oral: Lips and gums without lesions. Tongue and palate mucosa without lesions. Posterior oropharynx clear. Neck: No palpable adenopathy or masses Respiratory: Breathing comfortably  Skin: No facial/neck lesions or rash noted.  Procedures  Assessment: Dizziness questionable etiology. Chronic rhinitis. Questionable hearing loss.  Plan: We will go ahead and schedule for audiologic testing to evaluate tinnitus and complaints of hearing problems. Prescribed Nasacort 2 sprays each nostril at night to use for nasal sinus complaints. We will schedule her for VNG testing to evaluate vestibular function but this would not be able to be performed until January.   Radene Journey, MD   CC:

## 2019-12-20 ENCOUNTER — Other Ambulatory Visit: Payer: Self-pay

## 2019-12-20 ENCOUNTER — Encounter: Payer: Self-pay | Admitting: Family Medicine

## 2019-12-20 ENCOUNTER — Ambulatory Visit (INDEPENDENT_AMBULATORY_CARE_PROVIDER_SITE_OTHER): Payer: Medicare Other | Admitting: Family Medicine

## 2019-12-20 VITALS — BP 142/84 | HR 68 | Temp 97.4°F | Ht 61.0 in | Wt 166.0 lb

## 2019-12-20 DIAGNOSIS — E7800 Pure hypercholesterolemia, unspecified: Secondary | ICD-10-CM

## 2019-12-20 DIAGNOSIS — Z1231 Encounter for screening mammogram for malignant neoplasm of breast: Secondary | ICD-10-CM

## 2019-12-20 DIAGNOSIS — M85851 Other specified disorders of bone density and structure, right thigh: Secondary | ICD-10-CM | POA: Diagnosis not present

## 2019-12-20 DIAGNOSIS — I10 Essential (primary) hypertension: Secondary | ICD-10-CM

## 2019-12-20 DIAGNOSIS — F411 Generalized anxiety disorder: Secondary | ICD-10-CM

## 2019-12-20 DIAGNOSIS — R252 Cramp and spasm: Secondary | ICD-10-CM

## 2019-12-20 MED ORDER — ROSUVASTATIN CALCIUM 10 MG PO TABS: 10.0000 mg | ORAL_TABLET | Freq: Every day | ORAL | 3 refills | Status: DC

## 2019-12-20 MED ORDER — HYDROCHLOROTHIAZIDE 25 MG PO TABS: 25.0000 mg | ORAL_TABLET | Freq: Every day | ORAL | 3 refills | Status: DC

## 2019-12-20 MED ORDER — ESCITALOPRAM OXALATE 10 MG PO TABS: 10.0000 mg | ORAL_TABLET | Freq: Every day | ORAL | 1 refills | Status: DC

## 2019-12-20 MED ORDER — POTASSIUM CHLORIDE CRYS ER 10 MEQ PO TBCR: 10.0000 meq | EXTENDED_RELEASE_TABLET | Freq: Every day | ORAL | 3 refills | Status: DC

## 2019-12-20 NOTE — Patient Instructions (Addendum)
BP goal less than 130/80   Hypertension, Adult Hypertension is another name for high blood pressure. High blood pressure forces your heart to work harder to pump blood. This can cause problems over time. There are two numbers in a blood pressure reading. There is a top number (systolic) over a bottom number (diastolic). It is best to have a blood pressure that is below 120/80. Healthy choices can help lower your blood pressure, or you may need medicine to help lower it. What are the causes? The cause of this condition is not known. Some conditions may be related to high blood pressure. What increases the risk?  Smoking.  Having type 2 diabetes mellitus, high cholesterol, or both.  Not getting enough exercise or physical activity.  Being overweight.  Having too much fat, sugar, calories, or salt (sodium) in your diet.  Drinking too much alcohol.  Having long-term (chronic) kidney disease.  Having a family history of high blood pressure.  Age. Risk increases with age.  Race. You may be at higher risk if you are African American.  Gender. Men are at higher risk than women before age 7. After age 10, women are at higher risk than men.  Having obstructive sleep apnea.  Stress. What are the signs or symptoms?  High blood pressure may not cause symptoms. Very high blood pressure (hypertensive crisis) may cause: ? Headache. ? Feelings of worry or nervousness (anxiety). ? Shortness of breath. ? Nosebleed. ? A feeling of being sick to your stomach (nausea). ? Throwing up (vomiting). ? Changes in how you see. ? Very bad chest pain. ? Seizures. How is this treated?  This condition is treated by making healthy lifestyle changes, such as: ? Eating healthy foods. ? Exercising more. ? Drinking less alcohol.  Your health care provider may prescribe medicine if lifestyle changes are not enough to get your blood pressure under control, and if: ? Your top number is above  130. ? Your bottom number is above 80.  Your personal target blood pressure may vary. Follow these instructions at home: Eating and drinking   If told, follow the DASH eating plan. To follow this plan: ? Fill one half of your plate at each meal with fruits and vegetables. ? Fill one fourth of your plate at each meal with whole grains. Whole grains include whole-wheat pasta, brown rice, and whole-grain bread. ? Eat or drink low-fat dairy products, such as skim milk or low-fat yogurt. ? Fill one fourth of your plate at each meal with low-fat (lean) proteins. Low-fat proteins include fish, chicken without skin, eggs, beans, and tofu. ? Avoid fatty meat, cured and processed meat, or chicken with skin. ? Avoid pre-made or processed food.  Eat less than 1,500 mg of salt each day.  Do not drink alcohol if: ? Your doctor tells you not to drink. ? You are pregnant, may be pregnant, or are planning to become pregnant.  If you drink alcohol: ? Limit how much you use to:  0-1 drink a day for women.  0-2 drinks a day for men. ? Be aware of how much alcohol is in your drink. In the [place] one drink equals one 12 oz bottle of beer (355 mL), one 5 oz glass of wine (148 mL), or one 1 oz glass of hard liquor (44 mL). Lifestyle   Work with your doctor to stay at a healthy weight or to lose weight. Ask your doctor what the best weight is for you.  Get at least 30 minutes of exercise most days of the week. This may include walking, swimming, or biking.  Get at least 30 minutes of exercise that strengthens your muscles (resistance exercise) at least 3 days a week. This may include lifting weights or doing Pilates.  Do not use any products that contain nicotine or tobacco, such as cigarettes, e-cigarettes, and chewing tobacco. If you need help quitting, ask your doctor.  Check your blood pressure at home as told by your doctor.  Keep all follow-up visits as told by your doctor. This is  important. Medicines  Take over-the-counter and prescription medicines only as told by your doctor. Follow directions carefully.  Do not skip doses of blood pressure medicine. The medicine does not work as well if you skip doses. Skipping doses also puts you at risk for problems.  Ask your doctor about side effects or reactions to medicines that you should watch for. Contact a doctor if you:  Think you are having a reaction to the medicine you are taking.  Have headaches that keep coming back (recurring).  Feel dizzy.  Have swelling in your ankles.  Have trouble with your vision. Get help right away if you:  Get a very bad headache.  Start to feel mixed up (confused).  Feel weak or numb.  Feel faint.  Have very bad pain in your: ? Chest. ? Belly (abdomen).  Throw up more than once.  Have trouble breathing. Summary  Hypertension is another name for high blood pressure.  High blood pressure forces your heart to work harder to pump blood.  For most people, a normal blood pressure is less than 120/80.  Making healthy choices can help lower blood pressure. If your blood pressure does not get lower with healthy choices, you may need to take medicine. This information is not intended to replace advice given to you by your health care provider. Make sure you discuss any questions you have with your health care provider. Document Revised: 09/07/2017 Document Reviewed: 09/07/2017 Elsevier Patient Education  [employer].  If you have lab work done today you will be contacted with your lab results within the next 2 weeks.  If you have not heard from [place] then please contact us. The fastest way to get your results is to register for My Chart.   IF you received an x-ray today, you will receive an invoice from [facility]. Please contact [facility] at [phone] with questions or concerns regarding your invoice.   IF you received labwork today, you  will receive an invoice from [facility]. Please contact [facility] at [phone] with questions or concerns regarding your invoice.   Our billing staff will not be able to assist you with questions regarding bills from these companies.  You will be contacted with the lab results as soon as they are available. The fastest way to get your results is to activate your My Chart account. Instructions are located on the last page of this paperwork. If you have not heard from [place] regarding the results in 2 weeks, please contact this office.

## 2019-12-20 NOTE — Progress Notes (Signed)
12/9/20213:18 PM  [name] 1950-12-21, 69 y.o., female [id number]  Chief Complaint  Patient presents with  . Transitions Of Care    Had recent awv through home health nurse from insurance co.    HPI:   Patient is a 69 y.o. female with past medical history significant for HTN, IBS, GERD, Depression who presents today for TOC.  Patient Care Team: Madai Nuccio, Laurita Quint, FNP as PCP - General (Family Medicine) Pieter Partridge, DO as Consulting Physician (Neurology)  ENT: dizziness Meclizine. Positional  HTN  HCTZ 12.5 Nebivolol 10mg  daily (too expensive) Not at goal< 130/80 Takes BP at home   BP Readings from Last 3 Encounters:  12/20/19 (!) 142/84  11/05/19 (!) 146/77  08/10/19 (!) 148/82    HLD  Simvastatin 20mg  daily: not taking due to muscle aches Lab Results  Component Value Date   CHOL 246 (H) 05/02/2019   HDL 43 05/02/2019   LDLCALC 158 (H) 05/02/2019   TRIG 244 (H) 05/02/2019   CHOLHDL 5.7 (H) 05/02/2019   The 10-year ASCVD risk score Mikey Bussing DC Jr., et al., 2013) is: 16.9%   Values used to calculate the score:     Age: 89 years     Sex: Female     Is Non-Hispanic African American: Yes     Diabetic: No     Tobacco smoker: No     Systolic Blood Pressure: 607 mmHg     Is BP treated: Yes     HDL Cholesterol: 43 mg/dL     Total Cholesterol: 246 mg/dL   Depression Fluoxetine 20mg  daily Not taking Anxiety and panic attacks PhQ-2 = 0  Screening Colon:  Done 2017 10 years Mammogram: 10/04/18 [facility] (order placed) PaP: 03/05/19 nilm Dexa: 2018 (-2.1 Osteopenia)  FRAX* 10-year Probability of Fracture Based on femoral neck BMD: DualFemur (Right) Major Osteoporotic Fracture: 5.0% Hip Fracture: 0.8% Population: [place] (Black) Risk Factors: None COVID: done Flu:  Done  Depression screen [facility] 2/9 12/20/2019 11/05/2019 08/02/2019  Decreased Interest 0 0 0  Down, Depressed, Hopeless 0 0 0  PHQ - 2 Score 0 0 0  Altered sleeping - - -  Tired, decreased  energy - - -  Change in appetite - - -  Feeling bad or failure about yourself  - - -  Trouble concentrating - - -  Moving slowly or fidgety/restless - - -  Suicidal thoughts - - -  PHQ-9 Score - - -    Fall Risk  12/20/2019 11/05/2019 08/02/2019 05/02/2019 02/14/2019  Falls in the past year? 0 1 0 1 1  Number falls in past yr: 0 0 0 0 0  Comment - - - - per pt [name] caught herself  Injury with Fall? 0 0 0 0 0  Follow up Falls evaluation completed Falls evaluation completed Falls evaluation completed Falls evaluation completed Falls evaluation completed     Allergies  Allergen Reactions  . Aspirin Shortness Of Breath  . Pollen Extract     Skin itching, runny eyes, sinus pressure  . Sulfa Antibiotics Itching    itching    Prior to Admission medications   Medication Sig Start Date End Date Taking? Authorizing Provider  hydrochlorothiazide (MICROZIDE) 12.5 MG capsule TAKE 1 CAPSULE(12.5 MG) BY MOUTH DAILY 12/11/19  Yes Adrian Prows, MD  ezetimibe (ZETIA) 10 MG tablet Take 1 tablet (10 mg total) by mouth daily after supper. Patient not taking: Reported on 12/20/2019 08/10/19 11/08/19  Adrian Prows, MD  FLUoxetine (PROZAC) 20  MG capsule Take 1 capsule (20 mg total) by mouth daily. Patient not taking: Reported on 12/20/2019 08/02/19   Rutherford Guys, MD  meclizine (ANTIVERT) 12.5 MG tablet Take 1 tablet (12.5 mg total) by mouth 2 (two) times daily as needed for dizziness. Patient not taking: Reported on 12/20/2019 11/05/19   Maximiano Coss, NP  nebivolol (BYSTOLIC) 10 MG tablet Take 1 tablet (10 mg total) by mouth daily. Patient not taking: Reported on 12/20/2019 08/10/19   Adrian Prows, MD  simvastatin (ZOCOR) 20 MG tablet TAKE 1 TABLET(20 MG) BY MOUTH AT BEDTIME Patient not taking: Reported on 12/20/2019 08/10/19   Adrian Prows, MD    Past Medical History:  Diagnosis Date  . Allergy    Zyrtec PRN  . Anemia   . Cataract   . Colon polyp 03/11/2007  . Diverticulosis    by colonoscopy 2009  .  Fibromyalgia    diagnosed by Dr. Teressa Lower. s/p rheumatology consultation  . GERD (gastroesophageal reflux disease)   . Hyperlipidemia   . Hypertension   . IBS (irritable bowel syndrome)    diarrhea, constipation alternating; s/p GI consult.  . Internal hemorrhoid    colonoscopy in 2009    Past Surgical History:  Procedure Laterality Date  . CESAREAN SECTION    . COLONOSCOPY    . ENDOVENOUS ABLATION SAPHENOUS VEIN W/ LASER Right 08/17/2016   endovenous laser ablation right greater saphenous vein by Tinnie Gens MD   . MYOMECTOMY    . TUBAL LIGATION    . UPPER GASTROINTESTINAL ENDOSCOPY      Social History   Tobacco Use  . Smoking status: Former Smoker    Packs/day: 2.00    Years: 27.00    Pack years: 54.00    Quit date: 10/25/1994    Years since quitting: 25.1  . Smokeless tobacco: Never Used  Substance Use Topics  . Alcohol use: No    Alcohol/week: 0.0 standard drinks    Family History  Problem Relation Age of Onset  . Hypertension Mother   . Stroke Mother        multiple CVAs/TIAs  . Heart disease Mother 79       CHF  . Cancer Father        prostate cancer  . Hypertension Sister   . Hyperlipidemia Sister   . Glaucoma Sister   . Hypertension Brother   . Prostatitis Brother   . Hypertension Maternal Grandmother   . Hypertension Maternal Grandfather   . Hypertension Sister   . Esophageal cancer Neg Hx   . Rectal cancer Neg Hx   . Stomach cancer Neg Hx     Review of Systems  Constitutional: Negative for chills, fever and malaise/fatigue.  Eyes: Negative for blurred vision and double vision.  Respiratory: Negative for cough, shortness of breath and wheezing.   Cardiovascular: Negative for chest pain, palpitations and leg swelling.  Gastrointestinal: Negative for abdominal pain, blood in stool, constipation, diarrhea, heartburn, nausea and vomiting.  Genitourinary: Negative for dysuria, frequency and hematuria.  Musculoskeletal: Positive for back pain.  Negative for joint pain.  Skin: Negative for rash.  Neurological: Positive for dizziness. Negative for weakness and headaches.  Psychiatric/Behavioral: The patient does not have insomnia.      OBJECTIVE:  Today's Vitals   12/20/19 1413 12/20/19 1435  BP: (!) 155/79 (!) 142/84  Pulse: 68   Temp: (!) 97.4 F (36.3 C)   SpO2: 99%   Weight: 166 lb (75.3 kg)   Height: 5'  1" (1.549 m)    Body mass index is 31.37 kg/m.   Physical Exam Constitutional:      General: [name] is not in acute distress.    Appearance: Normal appearance. [name] is not ill-appearing.  HENT:     Head: Normocephalic.  Cardiovascular:     Rate and Rhythm: Normal rate and regular rhythm.     Pulses: Normal pulses.     Heart sounds: Normal heart sounds. No murmur heard. No friction rub. No gallop.   Pulmonary:     Effort: Pulmonary effort is normal. No respiratory distress.     Breath sounds: Normal breath sounds. No stridor. No wheezing, rhonchi or rales.  Abdominal:     General: Bowel sounds are normal.     Palpations: Abdomen is soft.     Tenderness: There is no abdominal tenderness.  Musculoskeletal:     Right lower leg: Edema present.     Left lower leg: No edema.  Skin:    General: Skin is warm and dry.  Neurological:     Mental Status: [name] is alert and oriented to person, place, and time.  Psychiatric:        Mood and Affect: Mood normal.        Behavior: Behavior normal.     No results found for this or any previous visit (from the past 24 hour(s)).  No results found.   ASSESSMENT and PLAN  Problem List Items Addressed This Visit      Cardiovascular and Mediastinum   Essential hypertension   Relevant Medications   rosuvastatin (CRESTOR) 10 MG tablet   hydrochlorothiazide (HYDRODIURIL) 25 MG tablet  Stopped BP medications due to cost Increase HCTZ continue to monitor home BP for goal<130/80 BP today 142/84 Did not tolerate simvastatin, switching to crestor  R/se/b of medications  dicussed Started on daily KCL to prevent hypokalemia Will follow up with lab results     Musculoskeletal and Integument   Osteopenia of right hip - Primary   Relevant Orders   DG Bone Density     Other   Pure hypercholesterolemia   Relevant Medications   rosuvastatin (CRESTOR) 10 MG tablet   hydrochlorothiazide (HYDRODIURIL) 25 MG tablet   Muscle cramps   Relevant Medications   potassium chloride SA (KLOR-CON) 10 MEQ tablet   Other Relevant Orders   Magnesium   Phosphorus   Potassium   Vitamin D, 25-hydroxy    Other Visit Diagnoses    Encounter for screening mammogram for malignant neoplasm of breast       Relevant Orders   MM 3D SCREEN BREAST W/IMPLANT BILATERAL   GAD (generalized anxiety disorder)       Relevant Medications   escitalopram (LEXAPRO) 10 MG tablet Stopped fluoxetine due to headaches R/se/b of medication discussed Will f/u in 5 weeks to determine effectiveness  Continues to have issues with vertigo. Being followed by ENT      Return in about 5 weeks (around 01/24/2020) for medication follow up.   Huston Foley Deloma Spindle, FNP-BC Primary Care at [place], [place] [address] Ph.  [phone] Fax [phone]

## 2019-12-21 ENCOUNTER — Other Ambulatory Visit: Payer: Self-pay | Admitting: Family Medicine

## 2019-12-21 DIAGNOSIS — E559 Vitamin D deficiency, unspecified: Secondary | ICD-10-CM

## 2019-12-21 LAB — PHOSPHORUS: Phosphorus: 2.6 mg/dL — ABNORMAL LOW (ref 3.0–4.3)

## 2019-12-21 LAB — POTASSIUM: Potassium: 3.8 mmol/L (ref 3.5–5.2)

## 2019-12-21 LAB — VITAMIN D 25 HYDROXY (VIT D DEFICIENCY, FRACTURES): Vit D, 25-Hydroxy: 5.7 ng/mL — ABNORMAL LOW (ref 30.0–100.0)

## 2019-12-21 LAB — MAGNESIUM: Magnesium: 2.3 mg/dL (ref 1.6–2.3)

## 2019-12-21 MED ORDER — VITAMIN D (ERGOCALCIFEROL) 1.25 MG (50000 UNIT) PO CAPS: 50000.0000 [IU] | ORAL_CAPSULE | ORAL | 6 refills | Status: DC

## 2020-01-24 ENCOUNTER — Ambulatory Visit: Payer: Medicare Other | Admitting: Family Medicine

## 2020-02-06 ENCOUNTER — Ambulatory Visit: Payer: Medicare Other | Admitting: Family Medicine

## 2020-02-27 ENCOUNTER — Encounter: Payer: Self-pay | Admitting: Family Medicine

## 2020-02-27 ENCOUNTER — Other Ambulatory Visit: Payer: Self-pay

## 2020-02-27 ENCOUNTER — Ambulatory Visit (INDEPENDENT_AMBULATORY_CARE_PROVIDER_SITE_OTHER): Payer: Medicare Other | Admitting: Family Medicine

## 2020-02-27 VITALS — BP 146/79 | HR 64 | Temp 97.3°F | Ht 61.0 in | Wt 167.0 lb

## 2020-02-27 DIAGNOSIS — I10 Essential (primary) hypertension: Secondary | ICD-10-CM | POA: Diagnosis not present

## 2020-02-27 DIAGNOSIS — E559 Vitamin D deficiency, unspecified: Secondary | ICD-10-CM | POA: Diagnosis not present

## 2020-02-27 DIAGNOSIS — F411 Generalized anxiety disorder: Secondary | ICD-10-CM | POA: Diagnosis not present

## 2020-02-27 DIAGNOSIS — E7800 Pure hypercholesterolemia, unspecified: Secondary | ICD-10-CM | POA: Diagnosis not present

## 2020-02-27 NOTE — Progress Notes (Signed)
2/16/20223:48 PM  [name] 1950-10-27, 70 y.o., female [id number]  Chief Complaint  Patient presents with  . medication follow up     States has taken most of her medications except for lexapro due to possible side effects Gad 7= 7    HPI:   Patient is a 70 y.o. female with past medical history significant for HTN, IBS, GERD, Depression who presents today for TOC.  Patient Care Team: Nathanyl Andujo, Laurita Quint, FNP as PCP - General (Family Medicine) Pieter Partridge, DO as Consulting Physician (Neurology)  ENT: dizziness Meclizine. Positional Better than before Continues to be intermittent Tinnitus continues: audiology in March  HTN Increased last OV HCTZ 13m She never increased that medication and is continuing at HCTZ 12.5 mg Nebivolol 119mdaily (too expensive) Not at goal< 130/80 Takes BP at home occassionally Stopped BP medications due to cost Started on daily KCL to prevent hypokalemia Last potassium 3.8 in December  BP Readings from Last 3 Encounters:  02/27/20 (!) 146/79  12/20/19 (!) 142/84  11/05/19 (!) 146/77    HLD  Simvastatin 2053maily: not taking due to muscle aches Started Crestor last OV Lab Results  Component Value Date   CHOL 246 (H) 05/02/2019   HDL 43 05/02/2019   LDLCALC 158 (H) 05/02/2019   TRIG 244 (H) 05/02/2019   CHOLHDL 5.7 (H) 05/02/2019   The 10-year ASCVD risk score (GoMikey Bussing Jr., et al., 2013) is: 17.8%   Values used to calculate the score:     Age: [age]     Sex: Female     Is Non-Hispanic African American: Yes     Diabetic: No     Tobacco smoker: No     Systolic Blood Pressure: 146568Hg     Is BP treated: Yes     HDL Cholesterol: 43 mg/dL     Total Cholesterol: 246 mg/dL   Depression Anxiety and panic attacks PhQ-2 = 1 Doesn't want to see a counselor Last OV started escitalopram (LEXAPRO) 10 MG  Had Stopped fluoxetine due to headaches Never started the Lexapro  Vitamin D Deficiency Weekly 50,000 started last  OV Last vitamin D Lab Results  Component Value Date   VD25OH 5.7 (L) 12/20/2019    Screening Colon:  Done 2017 10 years Mammogram: 10/04/18 [facility]1: will do in August PaP: 03/05/19 nilm Dexa: 2018 (-2.1 Osteopenia) Will do in August FRAX* 10-year Probability of Fracture Based on femoral neck BMD: DualFemur (Right) Major Osteoporotic Fracture: 5.0% Hip Fracture: 0.8%  Health Maintenance  Topic Date Due  . INFLUENZA VACCINE  04/10/2020 (Originally 08/12/2019)  . COVID-19 Vaccine (4 - Booster for Pfizer series) 07/02/2020  . MAMMOGRAM  09/26/2020  . TETANUS/TDAP  12/05/2023  . COLONOSCOPY (Pts 45-49y80yrsurance coverage will need to be confirmed)  05/14/2025  . DEXA SCAN  Completed  . Hepatitis C Screening  Completed  . PNA vac Low Risk Adult  Completed     Depression screen PHQ [facility] 02/27/2020 12/20/2019 11/05/2019  Decreased Interest 1 0 0  Down, Depressed, Hopeless 0 0 0  PHQ - 2 Score 1 0 0  Altered sleeping - - -  Tired, decreased energy - - -  Change in appetite - - -  Feeling bad or failure about yourself  - - -  Trouble concentrating - - -  Moving slowly or fidgety/restless - - -  Suicidal thoughts - - -  PHQ-9 Score - - -    Fall Risk  02/27/2020  12/20/2019 11/05/2019 08/02/2019 05/02/2019  Falls in the past year? 0 0 1 0 1  Number falls in past yr: 0 0 0 0 0  Comment - - - - -  Injury with Fall? 0 0 0 0 0  Follow up Falls evaluation completed Falls evaluation completed Falls evaluation completed Falls evaluation completed Falls evaluation completed     Allergies  Allergen Reactions  . Aspirin Shortness Of Breath  . Pollen Extract     Skin itching, runny eyes, sinus pressure  . Sulfa Antibiotics Itching    itching    Prior to Admission medications   Medication Sig Start Date End Date Taking? Authorizing Provider  hydrochlorothiazide (MICROZIDE) 12.5 MG capsule TAKE 1 CAPSULE(12.5 MG) BY MOUTH DAILY 12/11/19  Yes Adrian Prows, MD  ezetimibe (ZETIA) 10 MG tablet  Take 1 tablet (10 mg total) by mouth daily after supper. Patient not taking: Reported on 12/20/2019 08/10/19 11/08/19  Adrian Prows, MD  FLUoxetine (PROZAC) 20 MG capsule Take 1 capsule (20 mg total) by mouth daily. Patient not taking: Reported on 12/20/2019 08/02/19   Rutherford Guys, MD  meclizine (ANTIVERT) 12.5 MG tablet Take 1 tablet (12.5 mg total) by mouth 2 (two) times daily as needed for dizziness. Patient not taking: Reported on 12/20/2019 11/05/19   Maximiano Coss, NP  nebivolol (BYSTOLIC) 10 MG tablet Take 1 tablet (10 mg total) by mouth daily. Patient not taking: Reported on 12/20/2019 08/10/19   Adrian Prows, MD  simvastatin (ZOCOR) 20 MG tablet TAKE 1 TABLET(20 MG) BY MOUTH AT BEDTIME Patient not taking: Reported on 12/20/2019 08/10/19   Adrian Prows, MD    Past Medical History:  Diagnosis Date  . Allergy    Zyrtec PRN  . Anemia   . Cataract   . Colon polyp 03/11/2007  . Diverticulosis    by colonoscopy 2009  . Fibromyalgia    diagnosed by Dr. Teressa Lower. s/p rheumatology consultation  . GERD (gastroesophageal reflux disease)   . Hyperlipidemia   . Hypertension   . IBS (irritable bowel syndrome)    diarrhea, constipation alternating; s/p GI consult.  . Internal hemorrhoid    colonoscopy in 2009    Past Surgical History:  Procedure Laterality Date  . CESAREAN SECTION    . COLONOSCOPY    . ENDOVENOUS ABLATION SAPHENOUS VEIN W/ LASER Right 08/17/2016   endovenous laser ablation right greater saphenous vein by Tinnie Gens MD   . MYOMECTOMY    . TUBAL LIGATION    . UPPER GASTROINTESTINAL ENDOSCOPY      Social History   Tobacco Use  . Smoking status: Former Smoker    Packs/day: 2.00    Years: 27.00    Pack years: 54.00    Quit date: 10/25/1994    Years since quitting: 25.3  . Smokeless tobacco: Never Used  Substance Use Topics  . Alcohol use: No    Alcohol/week: 0.0 standard drinks    Family History  Problem Relation Age of Onset  . Hypertension Mother   .  Stroke Mother        multiple CVAs/TIAs  . Heart disease Mother 39       CHF  . Cancer Father        prostate cancer  . Hypertension Sister   . Hyperlipidemia Sister   . Glaucoma Sister   . Hypertension Brother   . Prostatitis Brother   . Hypertension Maternal Grandmother   . Hypertension Maternal Grandfather   . Hypertension Sister   .  Esophageal cancer Neg Hx   . Rectal cancer Neg Hx   . Stomach cancer Neg Hx     Review of Systems  Constitutional: Negative for chills, fever and malaise/fatigue.  Eyes: Negative for blurred vision and double vision.  Respiratory: Negative for cough, shortness of breath and wheezing.   Cardiovascular: Negative for chest pain, palpitations and leg swelling.  Gastrointestinal: Negative for abdominal pain, blood in stool, constipation, diarrhea, heartburn, nausea and vomiting.  Genitourinary: Negative for dysuria, frequency and hematuria.  Musculoskeletal: Positive for back pain. Negative for joint pain.  Skin: Negative for rash.  Neurological: Positive for dizziness. Negative for weakness and headaches.  Psychiatric/Behavioral: The patient does not have insomnia.      OBJECTIVE:  Today's Vitals   02/27/20 1511  BP: (!) 146/79  Pulse: 64  Temp: (!) 97.3 F (36.3 C)  SpO2: 99%  Weight: 167 lb (75.8 kg)  Height: '5\' 1"'  (1.549 m)   Body mass index is 31.55 kg/m.   Physical Exam Constitutional:      General: She is not in acute distress.    Appearance: Normal appearance. She is not ill-appearing.  HENT:     Head: Normocephalic.  Cardiovascular:     Rate and Rhythm: Normal rate and regular rhythm.     Pulses: Normal pulses.     Heart sounds: Normal heart sounds. No murmur heard. No friction rub. No gallop.   Pulmonary:     Effort: Pulmonary effort is normal. No respiratory distress.     Breath sounds: Normal breath sounds. No stridor. No wheezing, rhonchi or rales.  Abdominal:     General: Bowel sounds are normal.      Palpations: Abdomen is soft.     Tenderness: There is no abdominal tenderness.  Musculoskeletal:     Right lower leg: No edema.     Left lower leg: No edema.  Skin:    General: Skin is warm and dry.  Neurological:     Mental Status: She is alert and oriented to person, place, and time.  Psychiatric:        Mood and Affect: Mood normal.        Behavior: Behavior normal.     No results found for this or any previous visit (from the past 24 hour(s)).  No results found.   ASSESSMENT and PLAN  Problem List Items Addressed This Visit      Cardiovascular and Mediastinum   Essential hypertension   Relevant Orders   CMP14+EGFR     Other   Pure hypercholesterolemia   Relevant Orders   Lipid Panel    Other Visit Diagnoses    Vitamin D deficiency    -  Primary   Relevant Orders   Vitamin D, 25-hydroxy   GAD (generalized anxiety disorder)            Plan  DIscussed BP goal< 130/80, encouraged to take HCTZ 25 mg, declines wanting to start additional medications, r/se/b discussed  Encouraged to take BP at home  Encouraged to start lexapro  Continue daily crestor, potassium and vitamin d: will follow up with lab results  Declines counseling at this time  Return in about 3 months (around 05/26/2020).   Huston Foley Jenavie Stanczak, FNP-BC Primary Care at [place], [place] [address] Ph.  [phone] Fax [phone]

## 2020-02-27 NOTE — Patient Instructions (Addendum)
  Health Maintenance After Age 71 After age 71, you are at a higher risk for certain long-term diseases and infections as well as injuries from falls. Falls are a major cause of broken bones and head injuries in people who are older than age 71. Getting regular preventive care can help to keep you healthy and well. Preventive care includes getting regular testing and making lifestyle changes as recommended by your health care provider. Talk with your health care provider about:  Which screenings and tests you should have. A screening is a test that checks for a disease when you have no symptoms.  A diet and exercise plan that is right for you. What should I know about screenings and tests to prevent falls? Screening and testing are the best ways to find a health problem early. Early diagnosis and treatment give you the best chance of managing medical conditions that are common after age 71. Certain conditions and lifestyle choices may make you more likely to have a fall. Your health care provider may recommend:  Regular vision checks. Poor vision and conditions such as cataracts can make you more likely to have a fall. If you wear glasses, make sure to get your prescription updated if your vision changes.  Medicine review. Work with your health care provider to regularly review all of the medicines you are taking, including over-the-counter medicines. Ask your health care provider about any side effects that may make you more likely to have a fall. Tell your health care provider if any medicines that you take make you feel dizzy or sleepy.  Osteoporosis screening. Osteoporosis is a condition that causes the bones to get weaker. This can make the bones weak and cause them to break more easily.  Blood pressure screening. Blood pressure changes and medicines to control blood pressure can make you feel dizzy.  Strength and balance checks. Your health care provider may recommend certain tests to  check your strength and balance while standing, walking, or changing positions.  Foot health exam. Foot pain and numbness, as well as not wearing proper footwear, can make you more likely to have a fall.  Depression screening. You may be more likely to have a fall if you have a fear of falling, feel emotionally low, or feel unable to do activities that you used to do.  Alcohol use screening. Using too much alcohol can affect your balance and may make you more likely to have a fall. What actions can I take to lower my risk of falls? General instructions  Talk with your health care provider about your risks for falling. Tell your health care provider if: ? You fall. Be sure to tell your health care provider about all falls, even ones that seem minor. ? You feel dizzy, sleepy, or off-balance.  Take over-the-counter and prescription medicines only as told by your health care provider. These include any supplements.  Eat a healthy diet and maintain a healthy weight. A healthy diet includes low-fat dairy products, low-fat (lean) meats, and fiber from whole grains, beans, and lots of fruits and vegetables. Home safety  Remove any tripping hazards, such as rugs, cords, and clutter.  Install safety equipment such as grab bars in bathrooms and safety rails on stairs.  Keep rooms and walkways well-lit. Activity  Follow a regular exercise program to stay fit. This will help you maintain your balance. Ask your health care provider what types of exercise are appropriate for you.  If you need a cane   or walker, use it as recommended by your health care provider.  Wear supportive shoes that have nonskid soles.   Lifestyle  Do not drink alcohol if your health care provider tells you not to drink.  If you drink alcohol, limit how much you have: ? 0-1 drink a day for women. ? 0-2 drinks a day for men.  Be aware of how much alcohol is in your drink. In the [place] one drink equals one typical bottle  of beer (12 oz), one-half glass of wine (5 oz), or one shot of hard liquor (1 oz).  Do not use any products that contain nicotine or tobacco, such as cigarettes and e-cigarettes. If you need help quitting, ask your health care provider. Summary  Having a healthy lifestyle and getting preventive care can help to protect your health and wellness after age 71.  Screening and testing are the best way to find a health problem early and help you avoid having a fall. Early diagnosis and treatment give you the best chance for managing medical conditions that are more common for people who are older than age 71.  Falls are a major cause of broken bones and head injuries in people who are older than age 71. Take precautions to prevent a fall at home.  Work with your health care provider to learn what changes you can make to improve your health and wellness and to prevent falls. This information is not intended to replace advice given to you by your health care provider. Make sure you discuss any questions you have with your health care provider. Document Revised: 04/20/2018 Document Reviewed: 11/10/2016 Elsevier Patient Education  2021 [employer]vier Inc.   If you have lab work done today you will be contacted with your lab results within the next 2 weeks.  If you have not heard from us then please contact us. The fastest way to get your results is to register for My Chart.   IF you received an x-ray today, you will receive an invoice from [facility]. Please contact [facility] at [phone] with questions or concerns regarding your invoice.   IF you received labwork today, you will receive an invoice from [facility]. Please contact [facility] at [phone] with questions or concerns regarding your invoice.   Our billing staff will not be able to assist you with questions regarding bills from these companies.  You will be contacted with the lab results as soon as they are available.  The fastest way to get your results is to activate your My Chart account. Instructions are located on the last page of this paperwork. If you have not heard from us regarding the results in 2 weeks, please contact this office.      

## 2020-02-28 ENCOUNTER — Other Ambulatory Visit: Payer: Self-pay | Admitting: Family Medicine

## 2020-02-28 DIAGNOSIS — E7800 Pure hypercholesterolemia, unspecified: Secondary | ICD-10-CM

## 2020-02-28 LAB — LIPID PANEL
Chol/HDL Ratio: 5.7 ratio — ABNORMAL HIGH (ref 0.0–4.4)
Cholesterol, Total: 274 mg/dL — ABNORMAL HIGH (ref 100–199)
HDL: 48 mg/dL (ref >39)
LDL Chol Calc (NIH): 194 mg/dL — ABNORMAL HIGH (ref 0–99)
Triglycerides: 172 mg/dL — ABNORMAL HIGH (ref 0–149)
VLDL Cholesterol Cal: 32 mg/dL (ref 5–40)

## 2020-02-28 LAB — CMP14+EGFR
ALT: 8 IU/L (ref 0–32)
AST: 13 IU/L (ref 0–40)
Albumin/Globulin Ratio: 1.4 (ref 1.2–2.2)
Albumin: 4.1 g/dL (ref 3.8–4.8)
Alkaline Phosphatase: 57 IU/L (ref 44–121)
BUN/Creatinine Ratio: 13 (ref 12–28)
BUN: 12 mg/dL (ref 8–27)
Bilirubin Total: 0.3 mg/dL (ref 0.0–1.2)
CO2: 23 mmol/L (ref 20–29)
Calcium: 9.7 mg/dL (ref 8.7–10.3)
Chloride: 100 mmol/L (ref 96–106)
Creatinine, Ser: 0.96 mg/dL (ref 0.57–1.00)
GFR calc Af Amer: 69 mL/min/{1.73_m2} (ref >59)
GFR calc non Af Amer: 60 mL/min/{1.73_m2} (ref >59)
Globulin, Total: 2.9 g/dL (ref 1.5–4.5)
Glucose: 104 mg/dL — ABNORMAL HIGH (ref 65–99)
Potassium: 3.8 mmol/L (ref 3.5–5.2)
Sodium: 138 mmol/L (ref 134–144)
Total Protein: 7 g/dL (ref 6.0–8.5)

## 2020-02-28 LAB — VITAMIN D 25 HYDROXY (VIT D DEFICIENCY, FRACTURES): Vit D, 25-Hydroxy: 28.7 ng/mL — ABNORMAL LOW (ref 30.0–100.0)

## 2020-02-28 MED ORDER — ROSUVASTATIN CALCIUM 20 MG PO TABS: 20.0000 mg | ORAL_TABLET | Freq: Every day | ORAL | 3 refills | Status: DC

## 2020-05-28 ENCOUNTER — Ambulatory Visit: Payer: Medicare Other | Admitting: Family Medicine

## 2020-07-02 ENCOUNTER — Other Ambulatory Visit: Payer: Self-pay | Admitting: Family Medicine

## 2020-07-02 ENCOUNTER — Ambulatory Visit
Admission: RE | Admit: 2020-07-02 | Discharge: 2020-07-02 | Disposition: A | Discharge status: Home / Self Care | Payer: Medicare Other | Source: Ambulatory Visit | Attending: Family Medicine | Admitting: Family Medicine

## 2020-07-02 ENCOUNTER — Other Ambulatory Visit: Payer: Self-pay

## 2020-07-02 DIAGNOSIS — Z1231 Encounter for screening mammogram for malignant neoplasm of breast: Secondary | ICD-10-CM

## 2020-07-02 DIAGNOSIS — M85851 Other specified disorders of bone density and structure, right thigh: Secondary | ICD-10-CM

## 2020-09-18 ENCOUNTER — Encounter: Payer: Self-pay | Admitting: Gastroenterology

## 2021-12-02 ENCOUNTER — Ambulatory Visit (INDEPENDENT_AMBULATORY_CARE_PROVIDER_SITE_OTHER): Payer: Medicare Other | Admitting: Internal Medicine

## 2021-12-02 ENCOUNTER — Encounter: Payer: Self-pay | Admitting: Internal Medicine

## 2021-12-02 VITALS — BP 170/81 | HR 65 | Temp 97.9°F | Ht 61.0 in | Wt 179.2 lb

## 2021-12-02 DIAGNOSIS — R609 Edema, unspecified: Secondary | ICD-10-CM

## 2021-12-02 DIAGNOSIS — I10 Essential (primary) hypertension: Secondary | ICD-10-CM

## 2021-12-02 DIAGNOSIS — Z23 Encounter for immunization: Secondary | ICD-10-CM | POA: Diagnosis not present

## 2021-12-02 DIAGNOSIS — E7800 Pure hypercholesterolemia, unspecified: Secondary | ICD-10-CM

## 2021-12-02 DIAGNOSIS — R0609 Other forms of dyspnea: Secondary | ICD-10-CM

## 2021-12-02 DIAGNOSIS — Z8639 Personal history of other endocrine, nutritional and metabolic disease: Secondary | ICD-10-CM

## 2021-12-02 DIAGNOSIS — Z6830 Body mass index (BMI) 30.0-30.9, adult: Secondary | ICD-10-CM

## 2021-12-02 DIAGNOSIS — H538 Other visual disturbances: Secondary | ICD-10-CM | POA: Diagnosis not present

## 2021-12-02 DIAGNOSIS — G4489 Other headache syndrome: Secondary | ICD-10-CM

## 2021-12-02 DIAGNOSIS — Z87891 Personal history of nicotine dependence: Secondary | ICD-10-CM

## 2021-12-02 DIAGNOSIS — K5790 Diverticulosis of intestine, part unspecified, without perforation or abscess without bleeding: Secondary | ICD-10-CM

## 2021-12-02 DIAGNOSIS — E785 Hyperlipidemia, unspecified: Secondary | ICD-10-CM

## 2021-12-02 DIAGNOSIS — M85851 Other specified disorders of bone density and structure, right thigh: Secondary | ICD-10-CM

## 2021-12-02 DIAGNOSIS — R519 Headache, unspecified: Secondary | ICD-10-CM

## 2021-12-02 DIAGNOSIS — R4789 Other speech disturbances: Secondary | ICD-10-CM

## 2021-12-02 DIAGNOSIS — E6609 Other obesity due to excess calories: Secondary | ICD-10-CM

## 2021-12-02 DIAGNOSIS — K219 Gastro-esophageal reflux disease without esophagitis: Secondary | ICD-10-CM

## 2021-12-02 DIAGNOSIS — Z789 Other specified health status: Secondary | ICD-10-CM

## 2021-12-02 HISTORY — DX: Personal history of nicotine dependence: Z87.891

## 2021-12-02 HISTORY — DX: Headache, unspecified: R51.9

## 2021-12-02 HISTORY — DX: Other specified health status: Z78.9

## 2021-12-02 MED ORDER — VALSARTAN-HYDROCHLOROTHIAZIDE 80-12.5 MG PO TABS: 1.0000 | ORAL_TABLET | Freq: Every day | ORAL | 3 refills | Status: DC

## 2021-12-02 NOTE — Assessment & Plan Note (Addendum)
Stable mild intermittent symptoms at this time but I advised her that if it seems to be progressing that she should not wait for the referrals that I placed instead should go straight to the hospital to get the consults and imaging  Seems to have vascular etiology base on BP and exertion associations.  Otherwise no signs & symptoms of serious underlying cause- except for age and vision changes.  She is oriented x 4.  She notes that she has had occasional difficulty with word finding for several years not associated with the headache  Unclear if the vision started before the headache or not but since that seem tightly associated I am going to refer to an eye specialist also a neurologist and get MRI and carotid ultrasound to evaluate further although I am okay with that if she decides she does not want to do all this as its only been going on a few days but there are some risk factors for to be something possibly serious in particular the vision issue  Could just be due to recent high BP but its not high enough right now to cause the blurred vision - yet she has the blurred vision right now.  She wants to hold off on neurology.

## 2021-12-02 NOTE — Assessment & Plan Note (Addendum)
I encouraged to treat all your heartburn with Pepcid Complete anytime you get it and pick some of that up the berry flavor taste great

## 2021-12-02 NOTE — Assessment & Plan Note (Signed)
Recheck vitamin d

## 2021-12-02 NOTE — Progress Notes (Signed)
[place]  Phone: [phone]  New patient visit  Visit Date: 12/02/2021 Patient: [name]   DOB: Oct 30, 1950   71 y.o. Female  MRN: [id number]  Today's healthcare provider: Loralee Pacas, MD  Assessment and Plan:     [name] was seen today for establish care, headache, hypertension, swelling, gastroesophageal reflux and abdominal pain.  Other headache syndrome Overview: Headache mostly all over fluctuating pressure, kind of far off feeling, feels a little off balance, blurry viison with itoff an on aching more like pressure and fullness Associated with ear symptoms maybe it all kind of runs together Started 11/28/21 Associated with tinnitus x 4 years so not tightly associated with   History of migraines & dizziness long ago but this headache is different.  Saw headache clinic back then Patient denies that its the "the worst ever," , slurred speech, problems moving yur arms or legs, loss of balance, confusion, or memory loss, denies thunderclap or positional Symptom(s) worsened by exertion, and the BP pill  Assessment & Plan: Stable mild intermittent symptoms at this time but I advised her that if it seems to be progressing that she should not wait for the referrals that I placed instead should go straight to the hospital to get the consults and imaging  Seems to have vascular etiology base on BP and exertion associations.  Otherwise no signs & symptoms of serious underlying cause- except for age and vision changes.  She is oriented x 4.  She notes that she has had occasional difficulty with word finding for several years not associated with the headache  Unclear if the vision started before the headache or not but since that seem tightly associated I am going to refer to an eye specialist also a neurologist and get MRI and carotid ultrasound to evaluate further although I am okay with that if she decides she does not want to do all this as its only  been going on a few days but there are some risk factors for to be something possibly serious in particular the vision issue  Could just be due to recent high BP but its not high enough right now to cause the blurred vision - yet she has the blurred vision right now.  She wants to hold off on neurology.        Orders: -     Ambulatory referral to Ophthalmology -     [place]; Future -     [place] Carotid Bilateral; Future  Need for influenza vaccination  Need for immunization against influenza -     Flu Vaccine QUAD High Dose(Fluad)  Blurry vision -     Ambulatory referral to Ophthalmology -     [place]; Future -     US Carotid Bilateral; Future  Word finding difficulty  Essential hypertension Overview: Cough Lisinopril. Joint aches Losartan. Amlodipine fatigued, lightheaded, leg swelling. Metoprolol joint aches. Does not desire diuretic due to overactive bladder. Clonidine with constipation, dry mouth.    Assessment & Plan: Individualized Hypertension Management: Stable despite headache and vision problem lately from being out of medications and being quite uncontrolled at  BP Readings from Last 1 Encounters:  12/02/21 (!) 170/81    Goal blood pressure of less than 140/90 explained Resistant/secondary hypertension workup: not necessary in my opinion yet   Adjust medications as follows:  Try low dose diovan  Return to clinic 1 week with BP reads  Standardized hypertension  counseling and management: Counseled her to limit: salt, alcohol, NSAIDS, excess body weight. Have explained risks of poor control are FUTURE stroke and heart attacks Encouragement for home blood pressure monitoring Explained Red Flag symptoms for ER: if blood pressure over 180 AND new headache, shortness of breath, confusion, or chest discomfort See AFTER VISIT SUMMARY for addition educational information provided Offered to refill meds.   Orders: -      Valsartan-hydroCHLOROthiazide; Take 1 tablet by mouth daily. To start: just half tablet for first week.  Dispense: 90 tablet; Refill: 3 -     CBC -     Comprehensive metabolic panel -     Hemoglobin A1c -     TSH  Pure hypercholesterolemia Overview: Crestor causes leg fatigue and malaise.  Orders: -     Lipid panel  Class 1 obesity due to excess calories with serious comorbidity and body mass index (BMI) of 30.0 to 30.9 in adult -     Hemoglobin A1c  History of vitamin D deficiency -     VITAMIN D 25 Hydroxy (Vit-D Deficiency, Fractures)  Swelling Overview: Feet hands face for years Has had work done on R leg and its been getting worse lately.   Assessment & Plan: She has pretty bad swelling and varicosity on the right leg it probably needs another procedure done on it I advised her to let me know who did the prior procedure and will refer back she will call and let me know  Orders: -     Brain natriuretic peptide -     D-dimer, quantitative  DOE (dyspnea on exertion) -     Brain natriuretic peptide  Gastroesophageal reflux disease without esophagitis Overview: Not as bad lately Used to take tagamet Taking a OTC she isn't sure which   Assessment & Plan: I encouraged to treat all your heartburn with Pepcid Complete anytime you get it and pick some of that up the berry flavor taste great   Osteopenia of right hip Overview: Off vitamin d  Assessment & Plan: Recheck vitamin d   Dyslipidemia Overview: Documented severe statin intolerance History of rosuvastatin  mar trial failure 02/28/2020  Lipid Panel     Component Value Date/Time   CHOL 274 (H) 02/27/2020 1549   TRIG 172 (H) 02/27/2020 1549   HDL 48 02/27/2020 1549   CHOLHDL 5.7 (H) 02/27/2020 1549   CHOLHDL 3.9 07/22/2015 1356   VLDL 24 07/22/2015 1356   LDLCALC 194 (H) 02/27/2020 1549   LABVLDL 32 02/27/2020 1549   The 10-year ASCVD risk score (Arnett DK, et al., 2019) is: 26.5%   Values used to  calculate the score:     Age: 43 years     Sex: Female     Is Non-Hispanic African American: Yes     Diabetic: No     Tobacco smoker: No     Systolic Blood Pressure: 974 mmHg     Is BP treated: Yes     HDL Cholesterol: 48 mg/dL     Total Cholesterol: 274 mg/dL   Assessment & Plan: Advised we will address this in more detail next week after I have more chance to review prior drug trials    Statin intolerance Assessment & Plan: She has tried multiple times to tolerate statin medications but always gets muscle aches and even 1 time had a memory impairment with them   Diverticulosis  History of smoking greater than 50 pack years Overview: Social History   Tobacco Use  Smoking Status Former   Packs/day: 2.00   Years: 27.00   Total pack years: 54.00   Types: Cigarettes   Quit date: 10/25/1994   Years since quitting: 27.1  Smokeless Tobacco Never            Today's visit is a problem focused visit, but preventive care maintenance records were considered by the physician in deciding follow up : Health Maintenance  Topic Date Due   Zoster Vaccines- Shingrix (1 of 2) Never done   Medicare Annual Wellness (AWV)  08/16/2019   MAMMOGRAM  07/03/2022   COLONOSCOPY (Pts 45-28yr Insurance coverage will need to be confirmed)  05/14/2025   Pneumonia Vaccine 71 Years old  Completed   INFLUENZA VACCINE  Completed   DEXA SCAN  Completed   Hepatitis C Screening  Completed   HPV VACCINES  Aged Out   COVID-19 Vaccine  Discontinued   She reported she was too afraid of guillain-barre syndrome to do shingles vaccination- I assured her this is very rare and its generally worth the risk.  Subjective:  Patient presents today to establish care. Her former Primary Care Provider (PCP) retired over a year ago and she has been out of medications due to delay in getting re-established. Chief Complaint  Patient presents with   Establish Care   Headache    Intermittent, has been for  several days up until today this time.   Hypertension   Swelling    Feet, hands and face.   Gastroesophageal Reflux    A lot of burning from throat down to stomach.   Abdominal Pain    Has diverticulosis and IBS.    Problem-oriented charting was used to update the medical history: Problem  Headache   Headache mostly all over fluctuating pressure, kind of far off feeling, feels a little off balance, blurry viison with itoff an on aching more like pressure and fullness Associated with ear symptoms maybe it all kind of runs together Started 11/28/21 Associated with tinnitus x 4 years so not tightly associated with   History of migraines & dizziness long ago but this headache is different.  Saw headache clinic back then Patient denies that its the "the worst ever," , slurred speech, problems moving yur arms or legs, loss of balance, confusion, or memory loss, denies thunderclap or positional Symptom(s) worsened by exertion, and the BP pill   Word Finding Difficulty  Swelling   Feet hands face for years Has had work done on R leg and its been getting worse lately.    Statin Intolerance  Diverticulosis  History of Smoking Greater Than 50 Pack Years   Social History   Tobacco Use  Smoking Status Former   Packs/day: 2.00   Years: 27.00   Total pack years: 54.00   Types: Cigarettes   Quit date: 10/25/1994   Years since quitting: 27.1  Smokeless Tobacco Never      Dyslipidemia   Documented severe statin intolerance History of rosuvastatin  mar trial failure 02/28/2020  Lipid Panel     Component Value Date/Time   CHOL 274 (H) 02/27/2020 1549   TRIG 172 (H) 02/27/2020 1549   HDL 48 02/27/2020 1549   CHOLHDL 5.7 (H) 02/27/2020 1549   CHOLHDL 3.9 07/22/2015 1356   VLDL 24 07/22/2015 1356   LDLCALC 194 (H) 02/27/2020 1549   LABVLDL 32 02/27/2020 1549   The 10-year ASCVD risk score (Arnett DK, et al., 2019) is: 26.5%   Values used to calculate the score:  Age: 37  years     Sex: Female     Is Non-Hispanic African American: Yes     Diabetic: No     Tobacco smoker: No     Systolic Blood Pressure: 147 mmHg     Is BP treated: Yes     HDL Cholesterol: 48 mg/dL     Total Cholesterol: 274 mg/dL    Obesity Due to Energy Imbalance  Osteopenia of Right Hip   Off vitamin d   Gastroesophageal Reflux Disease Without Esophagitis   Not as bad lately Used to take tagamet Taking a OTC she isn't sure which    Essential Hypertension   Cough Lisinopril. Joint aches Losartan. Amlodipine fatigued, lightheaded, leg swelling. Metoprolol joint aches. Does not desire diuretic due to overactive bladder. Clonidine with constipation, dry mouth.        Depression Screen    12/02/2021    2:17 PM 02/27/2020    3:14 PM 12/20/2019    2:22 PM 11/05/2019    2:13 PM  PHQ 2/9 Scores  PHQ - 2 Score 0 1 0 0    The following were reviewed and entered/updated in epic: Past Medical History:  Diagnosis Date   Allergy    Zyrtec PRN   Anemia    Cataract    Colon polyp 03/11/2007   Diverticulosis    by colonoscopy 2009   Fibromyalgia    diagnosed by Dr. Teressa Lower. s/p rheumatology consultation   GERD (gastroesophageal reflux disease)    Headache 12/02/2021   Headache mostly all over fluctuating pressure, kind of far off feeling, feels a little off balance, lurry viison with itoff an on aching more like pressure and fullness Associated with ear symptoms maybe it all kind of runs together Started 11/28/21    History of smoking greater than 50 pack years 12/02/2021   Social History  Tobacco Use Smoking Status Former  Packs/day: 2.00  Years: 27.00  Total pack years: 54.00  Types: Cigarettes  Quit date: 10/25/1994  Years since quitting: 27.1 Smokeless Tobacco Never     Hyperlipidemia    Hypertension    IBS (irritable bowel syndrome)    diarrhea, constipation alternating; s/p GI consult.   Internal hemorrhoid    colonoscopy in 2009   Statin  intolerance 12/02/2021   Past Surgical History:  Procedure Laterality Date   CESAREAN SECTION     COLONOSCOPY     ENDOVENOUS ABLATION SAPHENOUS VEIN W/ LASER Right 08/17/2016   endovenous laser ablation right greater saphenous vein by Tinnie Gens MD    MYOMECTOMY     TUBAL LIGATION     UPPER GASTROINTESTINAL ENDOSCOPY     Family History  Problem Relation Age of Onset   Hypertension Mother    Stroke Mother        multiple CVAs/TIAs   Heart disease Mother 25       CHF   Cancer Father        prostate cancer   Hypertension Sister    Hyperlipidemia Sister    Glaucoma Sister    Hypertension Brother    Prostatitis Brother    Hypertension Maternal Grandmother    Hypertension Maternal Grandfather    Hypertension Sister    Esophageal cancer Neg Hx    Rectal cancer Neg Hx    Stomach cancer Neg Hx    Outpatient Medications Prior to Visit  Medication Sig Dispense Refill   hydrochlorothiazide (HYDRODIURIL) 25 MG tablet Take 1 tablet (25 mg total) by mouth daily.  90 tablet 3   escitalopram (LEXAPRO) 10 MG tablet Take 1 tablet (10 mg total) by mouth daily. (Patient not taking: Reported on 02/27/2020) 60 tablet 1   meclizine (ANTIVERT) 12.5 MG tablet Take 1 tablet (12.5 mg total) by mouth 2 (two) times daily as needed for dizziness. (Patient not taking: Reported on 12/02/2021) 30 tablet 0   potassium chloride SA (KLOR-CON) 10 MEQ tablet Take 1 tablet (10 mEq total) by mouth daily. (Patient not taking: Reported on 12/02/2021) 90 tablet 3   rosuvastatin (CRESTOR) 20 MG tablet Take 1 tablet (20 mg total) by mouth daily. (Patient not taking: Reported on 12/02/2021) 90 tablet 3   Vitamin D, Ergocalciferol, (DRISDOL) 1.25 MG (50000 UNIT) CAPS capsule Take 1 capsule (50,000 Units total) by mouth every 7 (seven) days. (Patient not taking: Reported on 12/02/2021) 5 capsule 6   ezetimibe (ZETIA) 10 MG tablet Take 1 tablet (10 mg total) by mouth daily after supper. (Patient not taking: Reported on  12/20/2019) 30 tablet 2   No facility-administered medications prior to visit.    Allergies  Allergen Reactions   Aspirin Shortness Of Breath   Pollen Extract     Skin itching, runny eyes, sinus pressure   Sulfa Antibiotics Itching    itching   Social History   Tobacco Use   Smoking status: Former    Packs/day: 2.00    Years: 27.00    Total pack years: 54.00    Types: Cigarettes    Quit date: 10/25/1994    Years since quitting: 27.1   Smokeless tobacco: Never  Vaping Use   Vaping Use: Never used  Substance Use Topics   Alcohol use: No    Alcohol/week: 0.0 standard drinks of alcohol   Drug use: No    Immunization History  Administered Date(s) Administered   Fluad Quad(high Dose 65+) 10/12/2018, 12/02/2021   Influenza, High Dose Seasonal PF 10/13/2017, 09/27/2018   Influenza,inj,Quad PF,6+ Mos 11/10/2015, 09/14/2016   Influenza-Unspecified 03/14/2015, 09/27/2018   PFIZER(Purple Top)SARS-COV-2 Vaccination 03/09/2019, 04/04/2019, 01/02/2020   Pneumococcal Conjugate-13 03/14/2015   Pneumococcal Polysaccharide-23 07/12/2016   Tdap 12/04/2013    Objective:  BP (!) 170/81 (BP Location: Right Arm, Patient Position: Sitting)   Pulse 65   Temp 97.9 F (36.6 C) (Temporal)   Ht _0  (1.549 m)   Wt 179 lb 3.2 oz (81.3 kg)   SpO2 99%   BMI 33.86 kg/m  Body mass index is 33.86 kg/m.   Physical Exam Vitals and nursing note reviewed.  Constitutional:      General: She is awake. She is not in acute distress.    Appearance: Normal appearance. She is well-developed. She is obese. She is not ill-appearing, toxic-appearing or diaphoretic.  HENT:     Head: Normocephalic and atraumatic.     Nose: Nose normal.  Eyes:     General: Lids are normal. Vision grossly intact. No scleral icterus.       Right eye: No discharge.        Left eye: No discharge.     Conjunctiva/sclera: Conjunctivae normal.  Pulmonary:     Effort: Pulmonary effort is normal. No accessory muscle usage or  respiratory distress.  Neurological:     General: No focal deficit present.     Mental Status: She is alert.  Psychiatric:        Attention and Perception: Perception normal.        Mood and Affect: Mood normal.        Behavior:  Behavior normal.        Thought Content: Thought content normal.        Judgment: Judgment normal.      Results Reviewed: Results for orders placed or performed in visit on 02/27/20  Vitamin D, 25-hydroxy  Result Value Ref Range   Vit D, 25-Hydroxy 28.7 (L) 30.0 - 100.0 ng/mL  Lipid Panel  Result Value Ref Range   Cholesterol, Total 274 (H) 100 - 199 mg/dL   Triglycerides 172 (H) 0 - 149 mg/dL   HDL 48 >39 mg/dL   VLDL Cholesterol Cal 32 5 - 40 mg/dL   LDL Chol Calc (NIH) 194 (H) 0 - 99 mg/dL   Chol/HDL Ratio 5.7 (H) 0.0 - 4.4 ratio  CMP14+EGFR  Result Value Ref Range   Glucose 104 (H) 65 - 99 mg/dL   BUN 12 8 - 27 mg/dL   Creatinine, Ser 0.96 0.57 - 1.00 mg/dL   GFR calc non Af Amer 60 >59 mL/min/1.73   GFR calc Af Amer 69 >59 mL/min/1.73   BUN/Creatinine Ratio 13 12 - 28   Sodium 138 134 - 144 mmol/L   Potassium 3.8 3.5 - 5.2 mmol/L   Chloride 100 96 - 106 mmol/L   CO2 23 20 - 29 mmol/L   Calcium 9.7 8.7 - 10.3 mg/dL   Total Protein 7.0 6.0 - 8.5 g/dL   Albumin 4.1 3.8 - 4.8 g/dL   Globulin, Total 2.9 1.5 - 4.5 g/dL   Albumin/Globulin Ratio 1.4 1.2 - 2.2   Bilirubin Total 0.3 0.0 - 1.2 mg/dL   Alkaline Phosphatase 57 44 - 121 IU/L   AST 13 0 - 40 IU/L   ALT 8 0 - 32 IU/L

## 2021-12-02 NOTE — Assessment & Plan Note (Signed)
Advised we will address this in more detail next week after I have more chance to review prior drug trials

## 2021-12-02 NOTE — Assessment & Plan Note (Signed)
She has tried multiple times to tolerate statin medications but always gets muscle aches and even 1 time had a memory impairment with them

## 2021-12-02 NOTE — Assessment & Plan Note (Signed)
She has pretty bad swelling and varicosity on the right leg it probably needs another procedure done on it I advised her to let me know who did the prior procedure and will refer back she will call and let me know

## 2021-12-02 NOTE — Patient Instructions (Addendum)
It was a pleasure seeing you today! I truly hope you feel like you received 5 star service and please let me know if there is anything I can improve.  Loralee Pacas, MD   Today the plan is...  Other headache syndrome Assessment & Plan: Stable mild intermittent symptoms at this time but I advised her that if it seems to be progressing that she should not wait for the referrals that I placed instead should go straight to the hospital to get the consults and imaging  Seems to have vascular etiology base on BP and exertion associations.  Otherwise no signs & symptoms of serious underlying cause- except for age and vision changes.  She is oriented x 4.  She notes that she has had occasional difficulty with word finding for several years not associated with the headache  Unclear if the vision started before the headache or not but since that seem tightly associated I am going to refer to an eye specialist also a neurologist and get MRI and carotid ultrasound to evaluate further although I am okay with that if she decides she does not want to do all this as its only been going on a few days but there are some risk factors for to be something possibly serious in particular the vision issue  Could just be due to recent high BP but its not high enough right now to cause the blurred vision - yet she has the blurred vision right now.  She wants to hold off on neurology.        Orders: -     Ambulatory referral to Ophthalmology -     [place]; Future -     [place] Carotid Bilateral; Future  Need for influenza vaccination  Need for immunization against influenza -     Flu Vaccine QUAD High Dose(Fluad)  Blurry vision -     Ambulatory referral to Ophthalmology -     [place]; Future -     [place] Carotid Bilateral; Future  Word finding difficulty  Essential hypertension Assessment & Plan: Individualized Hypertension Management: Stable despite headache and vision  problem lately from being out of medications and being quite uncontrolled at  BP Readings from Last 1 Encounters:  12/02/21 (!) 170/81    Goal blood pressure of less than 140/90 explained Resistant/secondary hypertension workup: not necessary in my opinion yet   Adjust medications as follows:  Try low dose diovan  Return to clinic 1 week with BP reads  Standardized hypertension counseling and management: Counseled her to limit: salt, alcohol, NSAIDS, excess body weight. Have explained risks of poor control are FUTURE stroke and heart attacks Encouragement for home blood pressure monitoring Explained Red Flag symptoms for ER: if blood pressure over 180 AND new headache, shortness of breath, confusion, or chest discomfort See AFTER VISIT SUMMARY for addition educational information provided Offered to refill meds.   Orders: -     CBC -     Comprehensive metabolic panel -     TSH -     Hemoglobin A1c -     Valsartan-hydroCHLOROthiazide; Take 1 tablet by mouth daily. To start: just half tablet for first week.  Dispense: 90 tablet; Refill: 3  Pure hypercholesterolemia -     Lipid panel  Class 1 obesity due to excess calories with serious comorbidity and body mass index (BMI) of 30.0 to 30.9 in adult -     Hemoglobin A1c  History  of vitamin D deficiency -     VITAMIN D 25 Hydroxy (Vit-D Deficiency, Fractures)  Swelling Assessment & Plan: She has pretty bad swelling and varicosity on the right leg it probably needs another procedure done on it I advised her to let me know who did the prior procedure and will refer back she will call and let me know  Orders: -     Brain natriuretic peptide -     D-dimer, quantitative  DOE (dyspnea on exertion) -     Brain natriuretic peptide  Gastroesophageal reflux disease without esophagitis Assessment & Plan: I encouraged to treat all your heartburn with Pepcid Complete anytime you get it and pick some of that up the berry flavor taste  great   Osteopenia of right hip Assessment & Plan: Recheck vitamin d   Dyslipidemia  Statin intolerance Assessment & Plan: She has tried multiple times to tolerate statin medications but always gets muscle aches and even 1 time had a memory impairment with them          '[x]'$  RETURN TO CLINIC: Return in about 1 week (around 12/09/2021) for review response to recent changes.   - If you are not doing well: RETURN to the office sooner. - Please bring all your medicines to each appointment.  - If your condition begins to worsen or become severe:  GO to the ER.  '[x]'$  QUESTIONS/CONCERNS:  If you have follow-up questions / concerns:  - CLINICAL: please contact me via phone [phone] OR MyChart messaging  - [facility] you will be contacted with the lab results as soon as they are available. For any labs or imaging tests, we will call you if the results are significantly abnormal.  Most normal results will be posted to myChart as soon as they are available and I will comment on them there within 2-3 business days.  The fastest way to get your results is to activate your My Chart account. Instructions are located on the last page of this paperwork. If you have not heard from [place] regarding the results in 2 weeks, please contact this office.  - BILLING: xray and lab orders are billed from separate companies and questions./concerns should be directed to the [facility].  For visit charges please discuss with our administrative services

## 2021-12-02 NOTE — Assessment & Plan Note (Signed)
Individualized Hypertension Management: Stable despite headache and vision problem lately from being out of medications and being quite uncontrolled at  BP Readings from Last 1 Encounters:  12/02/21 (!) 170/81    Goal blood pressure of less than 140/90 explained Resistant/secondary hypertension workup: not necessary in my opinion yet   Adjust medications as follows:  Try low dose diovan  Return to clinic 1 week with BP reads  Standardized hypertension counseling and management: Counseled her to limit: salt, alcohol, NSAIDS, excess body weight. Have explained risks of poor control are FUTURE stroke and heart attacks Encouragement for home blood pressure monitoring Explained Red Flag symptoms for ER: if blood pressure over 180 AND new headache, shortness of breath, confusion, or chest discomfort See AFTER VISIT SUMMARY for addition educational information provided Offered to refill meds.

## 2021-12-03 LAB — TSH: TSH: 1.35 mIU/L (ref 0.40–4.50)

## 2021-12-03 LAB — COMPREHENSIVE METABOLIC PANEL
AG Ratio: 1.4 (calc) (ref 1.0–2.5)
ALT: 9 U/L (ref 6–29)
AST: 11 U/L (ref 10–35)
Albumin: 3.9 g/dL (ref 3.6–5.1)
Alkaline phosphatase (APISO): 48 U/L (ref 37–153)
BUN: 14 mg/dL (ref 7–25)
CO2: 25 mmol/L (ref 20–32)
Calcium: 9.2 mg/dL (ref 8.6–10.4)
Chloride: 109 mmol/L (ref 98–110)
Creat: 0.93 mg/dL (ref 0.60–1.00)
Globulin: 2.8 g/dL (calc) (ref 1.9–3.7)
Glucose, Bld: 109 mg/dL — ABNORMAL HIGH (ref 65–99)
Potassium: 4.4 mmol/L (ref 3.5–5.3)
Sodium: 140 mmol/L (ref 135–146)
Total Bilirubin: 0.6 mg/dL (ref 0.2–1.2)
Total Protein: 6.7 g/dL (ref 6.1–8.1)

## 2021-12-03 LAB — HEMOGLOBIN A1C
Hgb A1c MFr Bld: 5.8 % of total Hgb — ABNORMAL HIGH (ref <5.7)
Mean Plasma Glucose: 120 mg/dL
eAG (mmol/L): 6.6 mmol/L

## 2021-12-03 LAB — CBC
HCT: 41.6 % (ref 35.0–45.0)
Hemoglobin: 14.2 g/dL (ref 11.7–15.5)
MCH: 31.9 pg (ref 27.0–33.0)
MCHC: 34.1 g/dL (ref 32.0–36.0)
MCV: 93.5 fL (ref 80.0–100.0)
MPV: 12.2 fL (ref 7.5–12.5)
Platelets: 154 10*3/uL (ref 140–400)
RBC: 4.45 10*6/uL (ref 3.80–5.10)
RDW: 12.9 % (ref 11.0–15.0)
WBC: 3.3 10*3/uL — ABNORMAL LOW (ref 3.8–10.8)

## 2021-12-03 LAB — D-DIMER, QUANTITATIVE: D-Dimer, Quant: 0.39 mcg/mL FEU (ref <0.50)

## 2021-12-03 LAB — LIPID PANEL
Cholesterol: 259 mg/dL — ABNORMAL HIGH (ref <200)
HDL: 50 mg/dL (ref >=50)
LDL Cholesterol (Calc): 182 mg/dL (calc) — ABNORMAL HIGH
Non-HDL Cholesterol (Calc): 209 mg/dL (calc) — ABNORMAL HIGH (ref <130)
Total CHOL/HDL Ratio: 5.2 (calc) — ABNORMAL HIGH (ref <5.0)
Triglycerides: 131 mg/dL (ref <150)

## 2021-12-03 LAB — BRAIN NATRIURETIC PEPTIDE: Brain Natriuretic Peptide: 25 pg/mL (ref <100)

## 2021-12-03 LAB — VITAMIN D 25 HYDROXY (VIT D DEFICIENCY, FRACTURES): Vit D, 25-Hydroxy: 19 ng/mL — ABNORMAL LOW (ref 30–100)

## 2021-12-04 ENCOUNTER — Encounter: Payer: Self-pay | Admitting: Internal Medicine

## 2021-12-04 DIAGNOSIS — R7303 Prediabetes: Secondary | ICD-10-CM | POA: Insufficient documentation

## 2021-12-04 DIAGNOSIS — D72819 Decreased white blood cell count, unspecified: Secondary | ICD-10-CM | POA: Insufficient documentation

## 2021-12-09 ENCOUNTER — Ambulatory Visit (INDEPENDENT_AMBULATORY_CARE_PROVIDER_SITE_OTHER): Payer: Medicare Other | Admitting: Internal Medicine

## 2021-12-09 ENCOUNTER — Encounter: Payer: Self-pay | Admitting: Internal Medicine

## 2021-12-09 VITALS — BP 120/82 | HR 63 | Temp 97.8°F | Resp 12 | Ht 61.0 in | Wt 180.0 lb

## 2021-12-09 DIAGNOSIS — S90229A Contusion of unspecified lesser toe(s) with damage to nail, initial encounter: Secondary | ICD-10-CM | POA: Diagnosis not present

## 2021-12-09 DIAGNOSIS — G4489 Other headache syndrome: Secondary | ICD-10-CM

## 2021-12-09 DIAGNOSIS — E785 Hyperlipidemia, unspecified: Secondary | ICD-10-CM | POA: Diagnosis not present

## 2021-12-09 DIAGNOSIS — E559 Vitamin D deficiency, unspecified: Secondary | ICD-10-CM

## 2021-12-09 MED ORDER — PRALUENT 150 MG/ML ~~LOC~~ SOAJ: 150.0000 mg | SUBCUTANEOUS | 11 refills | Status: DC

## 2021-12-09 MED ORDER — OYSTER SHELL CALCIUM/D3 500-5 MG-MCG PO TABS: 1.0000 | ORAL_TABLET | Freq: Every day | ORAL | 3 refills | Status: AC

## 2021-12-09 NOTE — Assessment & Plan Note (Signed)
Uncertain diagnosis because she has no recall of trauma. But it is tender and appears likely to be hematoma Nonetheless  we need close follow up to be sure its not melanoma... Especially with all the headache and foggyheadedness. Return to clinic 2 weeks

## 2021-12-09 NOTE — Assessment & Plan Note (Addendum)
We reviewed how she has been doing since I recently saw her take is at least 80% good on if not completely gone today.  Also her vision is pretty clear at the moment.  So my concerns of serious underlying causes are even further diminished although the associated vision changes still have me concerned and I want her to get that MRI ultrasound of the carotids and eye doctor and I encouraged her to follow through with those things even if the headache stays gone.  I explained again how if it comes back with a really severe case it may be necessary to a go to the emergency room and not wait for these follow-up tests. I am really hopeful that this was just an unusual migraine headache that just happened to happen due to dehydration and I let her know that that is what I think is the most likely explanation even though I have ordered testing to confirm it is nothing more serious She denies any temporal tenderness eye pain weight loss fevers.  The 1 other thing that had me concerned is the sense of foggy headedness which would not be typical for migraine but I do think it is explained by blood pressure lowering medications and she has been noted to lower blood pressure.  She is reporting an evening slump in her mental energy and I am encouraging her to check her blood pressure 3 times a day and see if maybe the blood pressure is lower at that time a day and if there is no difference there then maybe try checking the blood sugar.  Slump thing has gone on for years.

## 2021-12-09 NOTE — Assessment & Plan Note (Signed)
Encouraged resume supplement

## 2021-12-09 NOTE — Patient Instructions (Addendum)
It was a pleasure seeing you today!  Your health and satisfaction are my top priorities. If you believe your experience today was worthy of a 5-star rating, I'd be grateful for your feedback! Loralee Pacas, MD   AT CHECKOUT:  '[]'$    Schedule next appointment:  2 weeks  If you are not doing well:  Return to the office sooner  Please bring all your medicines to each appointment If your condition begins to worsen or become severe:  GO to the ER   CLINICAL PLAN REMINDERS: Your checklist to help you remember today's clinical plan  '[]'$    We will try to prior authorization on praluent due to concern you can't take statin but you likely have significant vascular disease, maybe contributing to your headache.  Also stay hydrated. Also monitor blood pressure.  '[]'$   (Optional):  Review your clinical notes on MyChart after they are completed.

## 2021-12-09 NOTE — Assessment & Plan Note (Signed)
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

## 2021-12-09 NOTE — Progress Notes (Signed)
[facility]: [phone]   Routine Medical Office Visit  Patient:  [name]      Age: 71 y.o.       Sex:  female  Date:   12/09/2021  PCP:    Loralee Pacas, [place] Provider: Loralee Pacas, MD  Assessment/Plan:   Shelvie was seen today for one week follow-up new persistent headache with foggy headed feeling and she also wanted me to look at spot on toenail and we went over recent labs showing bad lipids and vitamin D deficiency  Other headache syndrome Overview: Headache mostly all over fluctuating pressure, kind of far off feeling, feels a little off balance, blurry viison with itoff an on aching more like pressure and fullness Associated with ear symptoms maybe it all kind of runs together Started 11/28/21 Associated with tinnitus x 4 years so not tightly associated with   History of migraines & dizziness long ago but this headache is different.  Saw headache clinic back then Patient denies that its the "the worst ever," , slurred speech, problems moving your arms or legs, loss of balance, confusion, or memory loss, denies thunderclap or positional  Symptom(s) worsened by exertion, and the BP pill; she reports she has continued to take the blood pressure pill and it still flares the headache and maybe the vision blurring.  ? Dehydration or low blood pressure related.  I wondered about vascular etiology base on BP and exertion associations, but since blood pressure lowering medicine makes headache and nausea worse, doesn't seem aneurysmal.  Other than age and vision changes no concerns of serious underlying cause- MRI pending, neurology deferred    She is persistently oriented x 4.  She notes that she has had occasional difficulty with word finding for several years not associated with the headache.   Unclear if the vision started before the headache or not but since that seem tightly associated so we did refer to an eye  specialist and carotid ultrasound  pending.    Only treatment medication is tylenol  Assessment & Plan: We reviewed how she has been doing since I recently saw her take is at least 80% good on if not completely gone today.  Also her vision is pretty clear at the moment.  So my concerns of serious underlying causes are even further diminished although the associated vision changes still have me concerned and I want her to get that MRI ultrasound of the carotids and eye doctor and I encouraged her to follow through with those things even if the headache stays gone.  I explained again how if it comes back with a really severe case it may be necessary to a go to the emergency room and not wait for these follow-up tests. I am really hopeful that this was just an unusual migraine headache that just happened to happen due to dehydration and I let her know that that is what I think is the most likely explanation even though I have ordered testing to confirm it is nothing more serious She denies any temporal tenderness eye pain weight loss fevers.  The 1 other thing that had me concerned is the sense of foggy headedness which would not be typical for migraine but I do think it is explained by blood pressure lowering medications and she has been noted to lower blood pressure.  She is reporting an evening slump in her mental energy and I am encouraging her to check her  blood pressure 3 times a day and see if maybe the blood pressure is lower at that time a day and if there is no difference there then maybe try checking the blood sugar.  Slump thing has gone on for years.    Dyslipidemia Overview: Documented severe statin intolerance History of rosuvastatin  mar trial failure 02/28/2020  Lipid Panel     Component Value Date/Time   CHOL 274 (H) 02/27/2020 1549   TRIG 172 (H) 02/27/2020 1549   HDL 48 02/27/2020 1549   CHOLHDL 5.7 (H) 02/27/2020 1549   CHOLHDL 3.9 07/22/2015 1356   VLDL 24 07/22/2015 1356    LDLCALC 194 (H) 02/27/2020 1549   LABVLDL 32 02/27/2020 1549   The 10-year ASCVD risk score (Arnett DK, et al., 2019) is: 26.5%   Values used to calculate the score:     Age: 18 years     Sex: Female     Is Non-Hispanic African American: Yes     Diabetic: No     Tobacco smoker: No     Systolic Blood Pressure: 170 mmHg     Is BP treated: Yes     HDL Cholesterol: 48 mg/dL     Total Cholesterol: 274 mg/dL   Assessment & Plan: Associated with extensive failed efforts to tolerate statin History of smoking, prediabetes, and hypertension  and headache concerning for vascular Twi on EKG 2021 cardiology workup and inferior fixed defect on EKG 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   Orders: -     Praluent; Inject 150 mg into the skin every 14 (fourteen) days.  Dispense: 2 mL; Refill: 11  Vitamin D deficiency Overview: Lab Results  Component Value Date/Time   VD25OH 19 (L) 12/02/2021 03:37 PM   VD25OH 28.7 (L) 02/27/2020 03:49 PM   VD25OH 5.7 (L) 12/20/2019 04:31 PM    History of not tolerating 50k weekly  Assessment & Plan: Encouraged resume supplement   Orders: -     Oyster Shell Calcium/D3; Take 1 tablet by mouth daily with breakfast.  Dispense: 90 tablet; Refill: 3  Subungual hematoma of toe, unspecified laterality, initial encounter Overview:    Assessment & Plan: Uncertain diagnosis because she has no recall of trauma. But it is tender and appears likely to be hematoma Nonetheless  we need close follow up to be sure its not melanoma... Especially with all the headache and foggyheadedness. Return to clinic 2 weeks   Orders: -     Ambulatory referral to Podiatry     Return in about 2 weeks (around 12/23/2021) for recheck toe lesion to rule out melanoma..   Today's key discussion points and AVS reminders.  She was encouraged to contact our office by phone or message via MyChart if she has any questions or concerns regarding our treatment plan  (see AVS).  Common side effects, risks, benefits, and alternatives for medications and treatment plan prescribed today were discussed, and she expressed understanding of the given instructions.  We discussed red flag symptoms and signs in detail and when to call the office or go to ER if her condition worsens (see AVS). She expressed understanding.  No barriers to understanding were identified  She was given an opportunity to ask questions/clarifications about all of the above.    Subjective:   GIOVANNA KEMMERER is a 71 y.o. female with past medical history including: Past Medical History:  Diagnosis Date   Allergy    Zyrtec PRN   Anemia  Cataract    Colon polyp 03/11/2007   Diverticulosis    by colonoscopy 2009   Fibromyalgia    diagnosed by Dr. Teressa Lower. s/p rheumatology consultation   GERD (gastroesophageal reflux disease)    Headache 12/02/2021   Headache mostly all over fluctuating pressure, kind of far off feeling, feels a little off balance, lurry viison with itoff an on aching more like pressure and fullness Associated with ear symptoms maybe it all kind of runs together Started 11/28/21    History of smoking greater than 50 pack years 12/02/2021   Social History  Tobacco Use Smoking Status Former  Packs/day: 2.00  Years: 27.00  Total pack years: 54.00  Types: Cigarettes  Quit date: 10/25/1994  Years since quitting: 27.1 Smokeless Tobacco Never     Hyperlipidemia    Hypertension    IBS (irritable bowel syndrome)    diarrhea, constipation alternating; s/p GI consult.   Internal hemorrhoid    colonoscopy in 2009   Statin intolerance 12/02/2021     She presented today reporting reason for visit as: Chief Complaint  Patient presents with   One week follow-up    Some slight improvement.   Spot on toenail    Dark area on left big toe nail.     Problem focused charting was used to record today's medical interview as follows: Problem  Subungual Hematoma  of Toe  Vitamin D Deficiency  Headache   Headache mostly all over fluctuating pressure, kind of far off feeling, feels a little off balance, blurry viison with itoff an on aching more like pressure and fullness Associated with ear symptoms maybe it all kind of runs together Started 11/28/21 Associated with tinnitus x 4 years so not tightly associated with   History of migraines & dizziness long ago but this headache is different.  Saw headache clinic back then Patient denies that its the "the worst ever," , slurred speech, problems moving your arms or legs, loss of balance, confusion, or memory loss, denies thunderclap or positional  Symptom(s) worsened by exertion, and the BP pill; she reports she has continued to take the blood pressure pill and it still flares the headache and maybe the vision blurring.  ? Dehydration or low blood pressure related.  I wondered about vascular etiology base on BP and exertion associations, but since blood pressure lowering medicine makes headache and nausea worse, doesn't seem aneurysmal.  Other than age and vision changes no concerns of serious underlying cause- MRI pending, neurology deferred    She is persistently oriented x 4.  She notes that she has had occasional difficulty with word finding for several years not associated with the headache.   Unclear if the vision started before the headache or not but since that seem tightly associated so we did refer to an eye specialist and carotid ultrasound  pending.    Only treatment medication is tylenol   Dyslipidemia   Documented severe statin intolerance History of rosuvastatin  mar trial failure 02/28/2020  Lipid Panel     Component Value Date/Time   CHOL 274 (H) 02/27/2020 1549   TRIG 172 (H) 02/27/2020 1549   HDL 48 02/27/2020 1549   CHOLHDL 5.7 (H) 02/27/2020 1549   CHOLHDL 3.9 07/22/2015 1356   VLDL 24 07/22/2015 1356   LDLCALC 194 (H) 02/27/2020 1549   LABVLDL 32 02/27/2020 1549   The  10-year ASCVD risk score (Arnett DK, et al., 2019) is: 26.5%   Values used to calculate the score:  Age: 89 years     Sex: Female     Is Non-Hispanic African American: Yes     Diabetic: No     Tobacco smoker: No     Systolic Blood Pressure: 254 mmHg     Is BP treated: Yes     HDL Cholesterol: 48 mg/dL     Total Cholesterol: 274 mg/dL               Objective:  Physical Exam: BP 120/82 (BP Location: Left Arm, Patient Position: Sitting)   Pulse 63   Temp 97.8 F (36.6 C) (Temporal)   Resp 12   Ht '5\' 1"'$  (1.549 m)   Wt 180 lb (81.6 kg)   SpO2 98%   BMI 34.01 kg/m   Problem-specific physical exam findings:   She struggles to articulate her symptoms but no apparent focal deficit and no change from prior neurological status. Dark spot is tender on the big toenail    Results Reviewed:  No results found for any visits on 12/09/21.   Recent Results (from the past 2160 hour(s))  B Nat Peptide     Status: None   Collection Time: 12/02/21  3:37 PM  Result Value Ref Range   Brain Natriuretic Peptide 25 <100 pg/mL    Comment: . BNP levels increase with age in the general population with the highest values seen in individuals greater than 38 years of age. Reference: J. Am. [place] Ar. Cardiol. 2002; 27:062-376. .   CBC     Status: Abnormal   Collection Time: 12/02/21  3:37 PM  Result Value Ref Range   WBC 3.3 (L) 3.8 - 10.8 Thousand/uL   RBC 4.45 3.80 - 5.10 Million/uL   Hemoglobin 14.2 11.7 - 15.5 g/dL   HCT 41.6 35.0 - 45.0 %   MCV 93.5 80.0 - 100.0 fL   MCH 31.9 27.0 - 33.0 pg   MCHC 34.1 32.0 - 36.0 g/dL   RDW 12.9 11.0 - 15.0 %   Platelets 154 140 - 400 Thousand/uL   MPV 12.2 7.5 - 12.5 fL  D-Dimer, Quantitative     Status: None   Collection Time: 12/02/21  3:37 PM  Result Value Ref Range   D-Dimer, Quant 0.39 <0.50 mcg/mL FEU    Comment: . The D-Dimer test is used frequently to exclude an acute PE or DVT. In patients with a low to moderate clinical risk  assessment and a D-Dimer result <0.50 mcg/mL FEU, the likelihood of a PE or DVT is very low. However, a thromboembolic event should not be excluded solely on the basis of the D-Dimer level. Increased levels of D-Dimer are associated with a PE, DVT, DIC, malignancies, inflammation, sepsis, surgery, trauma, pregnancy, and advancing patient age. [Jama 2006 11:295(2):199-207] . For additional information, please refer to: http://education.questdiagnostics.com/faq/FAQ149 (This link is being provided for informational/ educational purposes only) .   Comprehensive metabolic panel     Status: Abnormal   Collection Time: 12/02/21  3:37 PM  Result Value Ref Range   Glucose, Bld 109 (H) 65 - 99 mg/dL    Comment: .            Fasting reference interval . For someone without known diabetes, a glucose value between 100 and 125 mg/dL is consistent with prediabetes and should be confirmed with a follow-up test. .    BUN 14 7 - 25 mg/dL   Creat 0.93 0.60 - 1.00 mg/dL   BUN/Creatinine Ratio SEE NOTE: 6 - 22 (calc)  Comment:    Not Reported: BUN and Creatinine are within    reference range. .    Sodium 140 135 - 146 mmol/L   Potassium 4.4 3.5 - 5.3 mmol/L   Chloride 109 98 - 110 mmol/L   CO2 25 20 - 32 mmol/L   Calcium 9.2 8.6 - 10.4 mg/dL   Total Protein 6.7 6.1 - 8.1 g/dL   Albumin 3.9 3.6 - 5.1 g/dL   Globulin 2.8 1.9 - 3.7 g/dL (calc)   AG Ratio 1.4 1.0 - 2.5 (calc)   Total Bilirubin 0.6 0.2 - 1.2 mg/dL   Alkaline phosphatase (APISO) 48 37 - 153 U/L   AST 11 10 - 35 U/L   ALT 9 6 - 29 U/L  Hemoglobin A1c     Status: Abnormal   Collection Time: 12/02/21  3:37 PM  Result Value Ref Range   Hgb A1c MFr Bld 5.8 (H) <5.7 % of total Hgb    Comment: For someone without known diabetes, a hemoglobin  A1c value between 5.7% and 6.4% is consistent with prediabetes and should be confirmed with a  follow-up test. . For someone with known diabetes, a value <7% indicates that their  diabetes is well controlled. A1c targets should be individualized based on duration of diabetes, age, comorbid conditions, and other considerations. . This assay result is consistent with an increased risk of diabetes. . Currently, no consensus exists regarding use of hemoglobin A1c for diagnosis of diabetes for children. .    Mean Plasma Glucose 120 mg/dL   eAG (mmol/L) 6.6 mmol/L  Lipid panel     Status: Abnormal   Collection Time: 12/02/21  3:37 PM  Result Value Ref Range   Cholesterol 259 (H) <200 mg/dL   HDL 50 > OR = 50 mg/dL   Triglycerides 131 <150 mg/dL   LDL Cholesterol (Calc) 182 (H) mg/dL (calc)    Comment: Reference range: <100 . Desirable range <100 mg/dL for primary prevention;   <70 mg/dL for patients with CHD or diabetic patients  with > or = 2 CHD risk factors. Marland Kitchen LDL-C is now calculated using the Martin-Hopkins  calculation, which is a validated novel method providing  better accuracy than the Friedewald equation in the  estimation of LDL-C.  Cresenciano Genre et al. Annamaria Helling. 9528;413(24): 2061-2068  (http://education.[facility].com/faq/FAQ164)    Total CHOL/HDL Ratio 5.2 (H) <5.0 (calc)   Non-HDL Cholesterol (Calc) 209 (H) <130 mg/dL (calc)    Comment: For patients with diabetes plus 1 major ASCVD risk  factor, treating to a non-HDL-C goal of <100 mg/dL  (LDL-C of <70 mg/dL) is considered a therapeutic  option.   TSH     Status: None   Collection Time: 12/02/21  3:37 PM  Result Value Ref Range   TSH 1.35 0.40 - 4.50 mIU/L  VITAMIN D 25 Hydroxy (Vit-D Deficiency, Fractures)     Status: Abnormal   Collection Time: 12/02/21  3:37 PM  Result Value Ref Range   Vit D, 25-Hydroxy 19 (L) 30 - 100 ng/mL    Comment: Vitamin D Status         25-OH Vitamin D: . Deficiency:                    <20 ng/mL Insufficiency:             20 - 29 ng/mL Optimal:                 > or = 30 ng/mL .  For 25-OH Vitamin D testing on patients on  D2-supplementation and patients  for whom quantitation  of D2 and D3 fractions is required, the QuestAssureD(TM) 25-OH VIT D, (D2,D3), LC/MS/MS is recommended: order  code [id number] (patients >53yr). . See Note 1 . Note 1 . For additional information, please refer to  http://education.[facility].com/faq/FAQ199  (This link is being provided for informational/ educational purposes only.)

## 2021-12-11 ENCOUNTER — Other Ambulatory Visit: Payer: Self-pay | Admitting: Internal Medicine

## 2021-12-11 ENCOUNTER — Telehealth: Payer: Self-pay | Admitting: Internal Medicine

## 2021-12-11 DIAGNOSIS — Z1231 Encounter for screening mammogram for malignant neoplasm of breast: Secondary | ICD-10-CM

## 2021-12-11 NOTE — Telephone Encounter (Signed)
Pt states: -Because of symptoms, Specialty has requested PCP team to write orders for a: Bilateral Diagnostic Mamagram and Left Ultra Sound.  Pt Requests: -Orders be send to [facility] or call back with information from PCP Team.

## 2021-12-16 ENCOUNTER — Other Ambulatory Visit: Payer: Self-pay

## 2021-12-16 DIAGNOSIS — N644 Mastodynia: Secondary | ICD-10-CM

## 2021-12-16 NOTE — Telephone Encounter (Signed)
Ordered imaging/ultrasound for patient for the [facility].

## 2021-12-18 ENCOUNTER — Telehealth: Payer: Self-pay | Admitting: Internal Medicine

## 2021-12-18 NOTE — Telephone Encounter (Signed)
Patient states: -She is having an MRI done on 12/24/21.  - She believes this is the machine that puts you in a tube    Patient requests: -A temporary medication that could calm her down during her MRI be sent in

## 2021-12-21 ENCOUNTER — Other Ambulatory Visit: Payer: Self-pay | Admitting: Internal Medicine

## 2021-12-21 DIAGNOSIS — N644 Mastodynia: Secondary | ICD-10-CM

## 2021-12-23 ENCOUNTER — Ambulatory Visit: Payer: Medicare Other | Admitting: Internal Medicine

## 2021-12-24 ENCOUNTER — Other Ambulatory Visit: Payer: Medicare Other

## 2021-12-29 NOTE — Telephone Encounter (Signed)
This has been taken care of.

## 2021-12-31 ENCOUNTER — Ambulatory Visit: Payer: Medicare Other | Admitting: Podiatry

## 2021-12-31 DIAGNOSIS — S90212A Contusion of left great toe with damage to nail, initial encounter: Secondary | ICD-10-CM | POA: Diagnosis not present

## 2021-12-31 NOTE — Patient Instructions (Signed)
Look for urea 40% cream or ointment and apply to the thickened dry skin / calluses. This can be bought over the counter, at a pharmacy or online such as [employer].  

## 2022-01-05 NOTE — Progress Notes (Signed)
  Subjective:  Patient ID: [name], female    DOB: 1950/03/22,  MRN: [id number]  Chief Complaint  Patient presents with   Nail Problem    (np) Subungual hematoma of toe, black spot under toenail left great toe    71 y.o. female presents with the above complaint. History confirmed with patient.  She is not sure how long it has been here  Objective:  Physical Exam: warm, good capillary refill, no trophic changes or ulcerative lesions, normal DP and PT pulses, normal sensory exam, and subungual hematoma lateral proximal nail fold left hallux.     Assessment:   1. Subungual hematoma of great toe of left foot, initial encounter      Plan:  Patient was evaluated and treated and all questions answered.  Discussed the nature of subdural hematoma and pigmented lesions under the nailbed today.  Discussed with her the does not appear to be consistent with onychomycosis and I do not think there is anything that requires immediate biopsy that is concerning for a subungual melanoma.  I recommend monitoring and photographs and measurements were taken today.  I will see her back in follow-up 3 months  Return in about 3 months (around 04/01/2022) for follow up on spot on toenail .

## 2022-01-12 ENCOUNTER — Other Ambulatory Visit: Payer: Medicare Other

## 2022-01-19 ENCOUNTER — Telehealth: Payer: Self-pay | Admitting: Internal Medicine

## 2022-01-19 NOTE — Telephone Encounter (Signed)
Patient states: -She had to reschedule her MRI because she did not get a reply to her previous note. - She believes this is the machine that puts you in a tube      Patient requests: -A temporary medication that could calm her down during her MRI be sent in   Please advise and call pt back with details.

## 2022-01-19 NOTE — Telephone Encounter (Signed)
Copied from [place] [phone]. Topic: Medicare AWV >> Jan 19, 2022 12:15 PM Gillis Santa wrote: Reason for CRM: lvm PT CALL [phone] TO [place]

## 2022-01-26 ENCOUNTER — Other Ambulatory Visit: Payer: Medicare Other

## 2022-01-26 NOTE — Telephone Encounter (Signed)
Left vm to call the office back concerning this.

## 2022-02-01 ENCOUNTER — Encounter: Payer: Self-pay | Admitting: Internal Medicine

## 2022-02-01 ENCOUNTER — Ambulatory Visit (INDEPENDENT_AMBULATORY_CARE_PROVIDER_SITE_OTHER): Payer: Medicare Other | Admitting: Internal Medicine

## 2022-02-01 VITALS — BP 122/69 | HR 67 | Temp 98.6°F | Ht 61.0 in | Wt 177.8 lb

## 2022-02-01 DIAGNOSIS — R8290 Unspecified abnormal findings in urine: Secondary | ICD-10-CM | POA: Diagnosis not present

## 2022-02-01 DIAGNOSIS — I10 Essential (primary) hypertension: Secondary | ICD-10-CM | POA: Diagnosis not present

## 2022-02-01 DIAGNOSIS — L608 Other nail disorders: Secondary | ICD-10-CM | POA: Insufficient documentation

## 2022-02-01 DIAGNOSIS — F410 Panic disorder [episodic paroxysmal anxiety] without agoraphobia: Secondary | ICD-10-CM

## 2022-02-01 DIAGNOSIS — R3121 Asymptomatic microscopic hematuria: Secondary | ICD-10-CM

## 2022-02-01 DIAGNOSIS — S90222D Contusion of left lesser toe(s) with damage to nail, subsequent encounter: Secondary | ICD-10-CM

## 2022-02-01 MED ORDER — VALSARTAN 80 MG PO TABS: 80.0000 mg | ORAL_TABLET | Freq: Every day | ORAL | 1 refills | Status: DC

## 2022-02-01 MED ORDER — DIAZEPAM 10 MG PO TABS: 10.0000 mg | ORAL_TABLET | Freq: Two times a day (BID) | ORAL | 1 refills | Status: DC | PRN

## 2022-02-01 NOTE — Assessment & Plan Note (Signed)
This is around mri machines We discussed the sedative options so that she can get planned mri without panic attack She has had valium without issue in past, and her husband can drive her and watch her

## 2022-02-01 NOTE — Assessment & Plan Note (Addendum)
I suspect this is subungual hematoma and podiatrist also thinks but we both are hedging her beds and making sure it does not grow and so I encouraged her to continue with monthly check-in's on it but it is not growing on today's visit.  Most likely explanation is it is due to an ingrown toenail.  I will share this note back to Dr. Sherryle Lis for consideration of when he would like to see back but my impression based on the last 2 to 3 months is that there has been almost no change it has not grown or shrunk.  I does give me because to pause because I would have expected it to have shrunk or grown as a bruise.  Asked her to try to find old pictures of her feet may be an swimsuits and see if it has been there for a really long time she does not know of any that exist.  The presumed subungual amateur hematoma does appear to be sort of feathering at the borders a little bit versus 2 months ago and so I think this is most consistent with a bruise but I feel like I also cannot rule out melanoma with slight growth we will share these thoughts back to Dr. Sherryle Lis

## 2022-02-01 NOTE — Telephone Encounter (Signed)
This was taken care of during OV with Dr.Morrison today.

## 2022-02-01 NOTE — Progress Notes (Signed)
[facility]: [phone]   Routine Medical Office Visit  Patient:  [name]      Age: 72 y.o.       Sex:  female  Date:   02/01/2022  PCP:    Loralee Pacas, [place] Provider: Loralee Pacas, MD   Problem Focused Charting:   Medical Decision Making per Assessment/Plan   Charita was seen today for 2 week toe lesion recheck and hypertension.  Subungual hematoma of toe of left foot, subsequent encounter Overview:       Assessment & Plan: I suspect this is subungual hematoma and podiatrist also thinks but we both are hedging her beds and making sure it does not grow and so I encouraged her to continue with monthly check-in's on it but it is not growing on today's visit.  Most likely explanation is it is due to an ingrown toenail.  I will share this note back to Dr. Sherryle Lis for consideration of when he would like to see back but my impression based on the last 2 to 3 months is that there has been almost no change it has not grown or shrunk.  I does give me because to pause because I would have expected it to have shrunk or grown as a bruise.  Asked her to try to find old pictures of her feet may be an swimsuits and see if it has been there for a really long time she does not know of any that exist.  The presumed subungual amateur hematoma does appear to be sort of feathering at the borders a little bit versus 2 months ago and so I think this is most consistent with a bruise but I feel like I also cannot rule out melanoma with slight growth we will share these thoughts back to Dr. Sherryle Lis   Essential hypertension Overview: Probably essential, nonresistant. Treatment complicated by medication sensitivities: Cough Lisinopril. Joint aches Losartan. Amlodipine fatigued, lightheaded, leg swelling. Metoprolol joint aches. Does not desire diuretic due to overactive bladder. Clonidine with constipation, dry mouth.    Assessment &  Plan: Current hypertension medications:       Sig   valsartan-hydrochlorothiazide (DIOVAN-HCT) 80-12.5 MG tablet (Taking) Take 1 tablet by mouth daily. To start: just half tablet for first week.     We discussed about how hydrochlorothiazide is likely the responsible part of her blood pressure medicine causing the ringing in the ears and I therefore prescribed valsartan without hydrochlorothiazide.  She will miss that it keeps the fluid off but I encouraged her to just be aggressive on restricting salt and that she keep the fluid off.  I also suggested keeping the legs elevated a couple times a day to keep the fluid coming back to the kidney so you can pick it off without the pill  Orders: -     Valsartan; Take 1 tablet (80 mg total) by mouth daily. Take one half tablet 2 hours prior to mri.  Take another half tablet 30 minutes prior to MRI only if needed for continued anxiety  Dispense: 1 tablet; Refill: 1  Asymptomatic microscopic hematuria  Abnormal finding on urinalysis  Panic attack Assessment & Plan:  This is around mri machines We discussed the sedative options so that she can get planned mri without panic attack She has had valium without issue in past, and her husband can drive her and watch her  Orders: -     diazePAM; Take 1 tablet (10  mg total) by mouth every 12 (twelve) hours as needed for anxiety.  Dispense: 1 tablet; Refill: 1  Hypertension, unspecified type Overview: Probably essential, nonresistant. Treatment complicated by medication sensitivities: Cough Lisinopril. Joint aches Losartan. Amlodipine fatigued, lightheaded, leg swelling. Metoprolol joint aches. Does not desire diuretic due to overactive bladder. Clonidine with constipation, dry mouth.    Assessment & Plan: Current hypertension medications:       Sig   valsartan-hydrochlorothiazide (DIOVAN-HCT) 80-12.5 MG tablet (Taking) Take 1 tablet by mouth daily. To start: just half tablet for first week.      We discussed about how hydrochlorothiazide is likely the responsible part of her blood pressure medicine causing the ringing in the ears and I therefore prescribed valsartan without hydrochlorothiazide.  She will miss that it keeps the fluid off but I encouraged her to just be aggressive on restricting salt and that she keep the fluid off.  I also suggested keeping the legs elevated a couple times a day to keep the fluid coming back to the kidney so you can pick it off without the pill        Subjective - Clinical Presentation:   [name] is a 72 y.o. female  Patient Active Problem List   Diagnosis Date Noted   Asymptomatic microscopic hematuria 02/01/2022   Panic attack 02/01/2022   Subungual hematoma of toe 12/09/2021   Vitamin D deficiency 12/09/2021   Leucopenia 12/04/2021   Prediabetes 12/04/2021   Headache 12/02/2021   Word finding difficulty 12/02/2021   Swelling 12/02/2021   Statin intolerance 12/02/2021   Diverticulosis 12/02/2021   History of smoking greater than 50 pack years 12/02/2021   Dyslipidemia 12/09/2017   Obesity due to energy imbalance 12/09/2017   Muscle cramps 12/09/2017   Osteopenia of right hip 08/02/2017   Gastroesophageal reflux disease without esophagitis 07/28/2017   Irritable bowel syndrome with diarrhea 09/18/2016   Urge incontinence of urine 09/18/2016   Inflamed external hemorrhoid 07/24/2016   Varicose veins of right lower extremity with complications 00/17/4944   Hypertensive disorder 08/10/2015   Pure hypercholesterolemia 08/10/2015   Glucose intolerance (impaired glucose tolerance) 08/10/2015   Past Medical History:  Diagnosis Date   Allergy    Zyrtec PRN   Anemia    Cataract    Colon polyp 03/11/2007   Diverticulosis    by colonoscopy 2009   Fibromyalgia    diagnosed by Dr. Teressa Lower. s/p rheumatology consultation   GERD (gastroesophageal reflux disease)    Headache 12/02/2021   Headache mostly all over  fluctuating pressure, kind of far off feeling, feels a little off balance, lurry viison with itoff an on aching more like pressure and fullness Associated with ear symptoms maybe it all kind of runs together Started 11/28/21    History of smoking greater than 50 pack years 12/02/2021   Social History  Tobacco Use Smoking Status Former  Packs/day: 2.00  Years: 27.00  Total pack years: 54.00  Types: Cigarettes  Quit date: 10/25/1994  Years since quitting: 27.1 Smokeless Tobacco Never     Hyperlipidemia    Hypertension    IBS (irritable bowel syndrome)    diarrhea, constipation alternating; s/p GI consult.   Internal hemorrhoid    colonoscopy in 2009   Statin intolerance 12/02/2021    Outpatient Medications Prior to Visit  Medication Sig   Alirocumab (PRALUENT) 150 MG/ML SOAJ Inject 150 mg into the skin every 14 (fourteen) days.   calcium-vitamin D (OSCAL WITH D) 500-5 MG-MCG tablet Take  1 tablet by mouth daily with breakfast.   valsartan-hydrochlorothiazide (DIOVAN-HCT) 80-12.5 MG tablet Take 1 tablet by mouth daily. To start: just half tablet for first week.   No facility-administered medications prior to visit.    Chief Complaint  Patient presents with   2 week toe lesion recheck    To rule out melanoma. Saw podiatrist already   Hypertension    Making ear ringing louder    HPI           Objective:  Physical Exam  BP 122/69 (BP Location: Left Arm, Patient Position: Sitting)   Pulse 67   Temp 98.6 F (37 C) (Temporal)   Ht '5\' 1"'$  (1.549 m)   Wt 177 lb 12.8 oz (80.6 kg)   SpO2 99%   BMI 33.60 kg/m  Obese  by BMI criteria but truncal adiposity (waist circumference or caliper) should be used instead. Wt Readings from Last 10 Encounters:  02/01/22 177 lb 12.8 oz (80.6 kg)  12/09/21 180 lb (81.6 kg)  12/02/21 179 lb 3.2 oz (81.3 kg)  02/27/20 167 lb (75.8 kg)  12/20/19 166 lb (75.3 kg)  11/05/19 167 lb 3.2 oz (75.8 kg)  08/10/19 168 lb (76.2 kg)  08/02/19  170 lb (77.1 kg)  06/29/19 172 lb (78 kg)  05/22/19 173 lb (78.5 kg)   Vital signs reviewed.  Nursing notes reviewed. Weight trend reviewed. General Appearance:  Well developed, well nourished female in no acute distress.   Normal work of breathing at rest Musculoskeletal: All extremities are intact.  Neurological:  Awake, alert,  No obvious focal neurological deficits or cognitive impairments Psychiatric:  Appropriate mood, pleasant demeanor Problem-specific findings:  unchanged , possibly slight feathering of toe bruise vs melanocytic skin lesion   Results Reviewed: No results found for any visits on 02/01/22.  Recent Results (from the past 2160 hour(s))  B Nat Peptide     Status: None   Collection Time: 12/02/21  3:37 PM  Result Value Ref Range   Brain Natriuretic Peptide 25 <100 pg/mL    Comment: . BNP levels increase with age in the general population with the highest values seen in individuals greater than 37 years of age. Reference: J. Am. [place] Ar. Cardiol. 2002; 14:431-540. .   CBC     Status: Abnormal   Collection Time: 12/02/21  3:37 PM  Result Value Ref Range   WBC 3.3 (L) 3.8 - 10.8 Thousand/uL   RBC 4.45 3.80 - 5.10 Million/uL   Hemoglobin 14.2 11.7 - 15.5 g/dL   HCT 41.6 35.0 - 45.0 %   MCV 93.5 80.0 - 100.0 fL   MCH 31.9 27.0 - 33.0 pg   MCHC 34.1 32.0 - 36.0 g/dL   RDW 12.9 11.0 - 15.0 %   Platelets 154 140 - 400 Thousand/uL   MPV 12.2 7.5 - 12.5 fL  D-Dimer, Quantitative     Status: None   Collection Time: 12/02/21  3:37 PM  Result Value Ref Range   D-Dimer, Quant 0.39 <0.50 mcg/mL FEU    Comment: . The D-Dimer test is used frequently to exclude an acute PE or DVT. In patients with a low to moderate clinical risk assessment and a D-Dimer result <0.50 mcg/mL FEU, the likelihood of a PE or DVT is very low. However, a thromboembolic event should not be excluded solely on the basis of the D-Dimer level. Increased levels of D-Dimer are associated with a PE,  DVT, DIC, malignancies, inflammation, sepsis, surgery, trauma, pregnancy, and advancing patient  age. Annamaria Helling 2006 11:295(2):199-207] . For additional information, please refer to: http://education.questdiagnostics.com/faq/FAQ149 (This link is being provided for informational/ educational purposes only) .   Comprehensive metabolic panel     Status: Abnormal   Collection Time: 12/02/21  3:37 PM  Result Value Ref Range   Glucose, Bld 109 (H) 65 - 99 mg/dL    Comment: .            Fasting reference interval . For someone without known diabetes, a glucose value between 100 and 125 mg/dL is consistent with prediabetes and should be confirmed with a follow-up test. .    BUN 14 7 - 25 mg/dL   Creat 0.93 0.60 - 1.00 mg/dL   BUN/Creatinine Ratio SEE NOTE: 6 - 22 (calc)    Comment:    Not Reported: BUN and Creatinine are within    reference range. .    Sodium 140 135 - 146 mmol/L   Potassium 4.4 3.5 - 5.3 mmol/L   Chloride 109 98 - 110 mmol/L   CO2 25 20 - 32 mmol/L   Calcium 9.2 8.6 - 10.4 mg/dL   Total Protein 6.7 6.1 - 8.1 g/dL   Albumin 3.9 3.6 - 5.1 g/dL   Globulin 2.8 1.9 - 3.7 g/dL (calc)   AG Ratio 1.4 1.0 - 2.5 (calc)   Total Bilirubin 0.6 0.2 - 1.2 mg/dL   Alkaline phosphatase (APISO) 48 37 - 153 U/L   AST 11 10 - 35 U/L   ALT 9 6 - 29 U/L  Hemoglobin A1c     Status: Abnormal   Collection Time: 12/02/21  3:37 PM  Result Value Ref Range   Hgb A1c MFr Bld 5.8 (H) <5.7 % of total Hgb    Comment: For someone without known diabetes, a hemoglobin  A1c value between 5.7% and 6.4% is consistent with prediabetes and should be confirmed with a  follow-up test. . For someone with known diabetes, a value <7% indicates that their diabetes is well controlled. A1c targets should be individualized based on duration of diabetes, age, comorbid conditions, and other considerations. . This assay result is consistent with an increased risk of diabetes. . Currently, no consensus  exists regarding use of hemoglobin A1c for diagnosis of diabetes for children. .    Mean Plasma Glucose 120 mg/dL   eAG (mmol/L) 6.6 mmol/L  Lipid panel     Status: Abnormal   Collection Time: 12/02/21  3:37 PM  Result Value Ref Range   Cholesterol 259 (H) <200 mg/dL   HDL 50 > OR = 50 mg/dL   Triglycerides 131 <150 mg/dL   LDL Cholesterol (Calc) 182 (H) mg/dL (calc)    Comment: Reference range: <100 . Desirable range <100 mg/dL for primary prevention;   <70 mg/dL for patients with CHD or diabetic patients  with > or = 2 CHD risk factors. Marland Kitchen LDL-C is now calculated using the Martin-Hopkins  calculation, which is a validated novel method providing  better accuracy than the Friedewald equation in the  estimation of LDL-C.  Cresenciano Genre et al. Annamaria Helling. 8527;782(42): 2061-2068  (http://education.[facility].com/faq/FAQ164)    Total CHOL/HDL Ratio 5.2 (H) <5.0 (calc)   Non-HDL Cholesterol (Calc) 209 (H) <130 mg/dL (calc)    Comment: For patients with diabetes plus 1 major ASCVD risk  factor, treating to a non-HDL-C goal of <100 mg/dL  (LDL-C of <70 mg/dL) is considered a therapeutic  option.   TSH     Status: None   Collection Time: 12/02/21  3:37 PM  Result Value Ref Range   TSH 1.35 0.40 - 4.50 mIU/L  VITAMIN D 25 Hydroxy (Vit-D Deficiency, Fractures)     Status: Abnormal   Collection Time: 12/02/21  3:37 PM  Result Value Ref Range   Vit D, 25-Hydroxy 19 (L) 30 - 100 ng/mL    Comment: Vitamin D Status         25-OH Vitamin D: . Deficiency:                    <20 ng/mL Insufficiency:             20 - 29 ng/mL Optimal:                 > or = 30 ng/mL . For 25-OH Vitamin D testing on patients on  D2-supplementation and patients for whom quantitation  of D2 and D3 fractions is required, the QuestAssureD(TM) 25-OH VIT D, (D2,D3), LC/MS/MS is recommended: order  code [id number] (patients >73yr). . See Note 1 . Note 1 . For additional information, please refer to   http://education.[facility].com/faq/FAQ199  (This link is being provided for informational/ educational purposes only.)           Signed: RLoralee Pacas MD 02/01/2022 7:55 PM

## 2022-02-01 NOTE — Assessment & Plan Note (Signed)
Current hypertension medications:       Sig   valsartan-hydrochlorothiazide (DIOVAN-HCT) 80-12.5 MG tablet (Taking) Take 1 tablet by mouth daily. To start: just half tablet for first week.     We discussed about how hydrochlorothiazide is likely the responsible part of her blood pressure medicine causing the ringing in the ears and I therefore prescribed valsartan without hydrochlorothiazide.  She will miss that it keeps the fluid off but I encouraged her to just be aggressive on restricting salt and that she keep the fluid off.  I also suggested keeping the legs elevated a couple times a day to keep the fluid coming back to the kidney so you can pick it off without the pill

## 2022-02-09 ENCOUNTER — Ambulatory Visit
Admission: RE | Admit: 2022-02-09 | Discharge: 2022-02-09 | Disposition: A | Discharge status: Home / Self Care | Payer: Medicare Other | Source: Ambulatory Visit | Attending: Internal Medicine | Admitting: Internal Medicine

## 2022-02-09 DIAGNOSIS — G4489 Other headache syndrome: Secondary | ICD-10-CM

## 2022-02-09 DIAGNOSIS — H538 Other visual disturbances: Secondary | ICD-10-CM

## 2022-02-09 MED ORDER — GADOPICLENOL 0.5 MMOL/ML IV SOLN
8.5000 mL | Freq: Once | INTRAVENOUS | Status: AC | PRN
  Administered 2022-02-09: 8.5 mL via INTRAVENOUS

## 2022-02-26 ENCOUNTER — Telehealth: Payer: Self-pay | Admitting: Internal Medicine

## 2022-02-26 NOTE — Telephone Encounter (Signed)
[facility] would like a call back for clarification on an order that was sent to them. Please call them @ [place].

## 2022-03-02 ENCOUNTER — Ambulatory Visit
Admission: RE | Admit: 2022-03-02 | Discharge: 2022-03-02 | Disposition: A | Discharge status: Home / Self Care | Payer: Medicare Other | Source: Ambulatory Visit | Attending: Internal Medicine | Admitting: Internal Medicine

## 2022-03-02 DIAGNOSIS — N644 Mastodynia: Secondary | ICD-10-CM

## 2022-03-04 ENCOUNTER — Ambulatory Visit: Payer: Medicare Other | Admitting: Internal Medicine

## 2022-03-04 NOTE — Telephone Encounter (Signed)
Called and a message was relayed to Leatrice Jewels to call me back.

## 2022-03-18 NOTE — Telephone Encounter (Signed)
[name] never did call me back to my knowledge.

## 2022-03-19 ENCOUNTER — Telehealth: Payer: Self-pay

## 2022-03-19 NOTE — Telephone Encounter (Signed)
Called patient to schedule Medicare Annual Wellness Visit (AWV). Left message for patient to call back and schedule Medicare Annual Wellness Visit (AWV).  Last date of AWV: 08/16/18  Please schedule an appointment at any time with [facility] health Coach for an AWV-S.   Norton Blizzard, [place] (AAMA)  [place] [facility] [phone]

## 2022-03-25 ENCOUNTER — Ambulatory Visit: Payer: Medicare Other | Admitting: Internal Medicine

## 2022-03-29 ENCOUNTER — Telehealth: Payer: Self-pay | Admitting: Internal Medicine

## 2022-03-29 NOTE — Telephone Encounter (Signed)
Contacted [name] to schedule their annual wellness visit. Appointment made for 04/08/2022.  [place] Direct Dial [phone]

## 2022-03-31 ENCOUNTER — Ambulatory Visit: Payer: Medicare Other | Admitting: Internal Medicine

## 2022-04-01 ENCOUNTER — Ambulatory Visit: Payer: Medicare Other | Admitting: Podiatry

## 2022-04-08 ENCOUNTER — Ambulatory Visit (INDEPENDENT_AMBULATORY_CARE_PROVIDER_SITE_OTHER): Payer: Medicare Other

## 2022-04-08 VITALS — Wt 177.0 lb

## 2022-04-08 DIAGNOSIS — Z0000 Encounter for general adult medical examination without abnormal findings: Secondary | ICD-10-CM

## 2022-04-08 NOTE — Patient Instructions (Signed)
[name] , Thank you for taking time to come for your Medicare Wellness Visit. I appreciate your ongoing commitment to your health goals. Please review the following plan we discussed and let me know if I can assist you in the future.   These are the goals we discussed:  Goals      Patient Stated     Eat right. Lose weight  and exercise      Weight (lb) < 200 lb (90.7 kg)     Loose weight/stop drinking soda         This is a list of the screening recommended for you and due dates:  Health Maintenance  Topic Date Due   Zoster (Shingles) Vaccine (1 of 2) Never done   Medicare Annual Wellness Visit  04/08/2023   DTaP/Tdap/Td vaccine (2 - Td or Tdap) 12/05/2023   Mammogram  03/02/2024   Colon Cancer Screening  05/14/2025   Pneumonia Vaccine  Completed   Flu Shot  Completed   DEXA scan (bone density measurement)  Completed   Hepatitis C Screening: USPSTF Recommendation to screen - Ages 51-79 yo.  Completed   HPV Vaccine  Aged Out   COVID-19 Vaccine  Discontinued    Advanced directives: Please bring a copy of your health care power of attorney and living will to the office at your convenience.  Conditions/risks identified: Eat right. Lose weight and exercise   Next appointment: Follow up in one year for your annual wellness visit    Preventive Care 65 Years and Older, Female Preventive care refers to lifestyle choices and visits with your health care provider that can promote health and wellness. What does preventive care include? A yearly physical exam. This is also called an annual well check. Dental exams once or twice a year. Routine eye exams. Ask your health care provider how often you should have your eyes checked. Personal lifestyle choices, including: Daily care of your teeth and gums. Regular physical activity. Eating a healthy diet. Avoiding tobacco and drug use. Limiting alcohol use. Practicing safe sex. Taking low-dose aspirin every day. Taking vitamin  and mineral supplements as recommended by your health care provider. What happens during an annual well check? The services and screenings done by your health care provider during your annual well check will depend on your age, overall health, lifestyle risk factors, and family history of disease. Counseling  Your health care provider may ask you questions about your: Alcohol use. Tobacco use. Drug use. Emotional well-being. Home and relationship well-being. Sexual activity. Eating habits. History of falls. Memory and ability to understand (cognition). Work and work Statistician. Reproductive health. Screening  You may have the following tests or measurements: Height, weight, and BMI. Blood pressure. Lipid and cholesterol levels. These may be checked every 5 years, or more frequently if you are over 32 years old. Skin check. Lung cancer screening. You may have this screening every year starting at age 57 if you have a 30-pack-year history of smoking and currently smoke or have quit within the past 15 years. Fecal occult blood test (FOBT) of the stool. You may have this test every year starting at age 62. Flexible sigmoidoscopy or colonoscopy. You may have a sigmoidoscopy every 5 years or a colonoscopy every 10 years starting at age 50. Hepatitis C blood test. Hepatitis B blood test. Sexually transmitted disease (STD) testing. Diabetes screening. This is done by checking your blood sugar (glucose) after you have not eaten for a while (fasting). You may  have this done every 1-3 years. Bone density scan. This is done to screen for osteoporosis. You may have this done starting at age 54. Mammogram. This may be done every 1-2 years. Talk to your health care provider about how often you should have regular mammograms. Talk with your health care provider about your test results, treatment options, and if necessary, the need for more tests. Vaccines  Your health care provider may recommend  certain vaccines, such as: Influenza vaccine. This is recommended every year. Tetanus, diphtheria, and acellular pertussis (Tdap, Td) vaccine. You may need a Td booster every 10 years. Zoster vaccine. You may need this after age 58. Pneumococcal 13-valent conjugate (PCV13) vaccine. One dose is recommended after age 41. Pneumococcal polysaccharide (PPSV23) vaccine. One dose is recommended after age 63. Talk to your health care provider about which screenings and vaccines you need and how often you need them. This information is not intended to replace advice given to you by your health care provider. Make sure you discuss any questions you have with your health care provider. Document Released: 01/24/2015 Document Revised: 09/17/2015 Document Reviewed: 10/29/2014 Elsevier Interactive Patient Education  2017 [place] Prevention in the Home Falls can cause injuries. They can happen to people of all ages. There are many things you can do to make your home safe and to help prevent falls. What can I do on the outside of my home? Regularly fix the edges of walkways and driveways and fix any cracks. Remove anything that might make you trip as you walk through a door, such as a raised step or threshold. Trim any bushes or trees on the path to your home. Use bright outdoor lighting. Clear any walking paths of anything that might make someone trip, such as rocks or tools. Regularly check to see if handrails are loose or broken. Make sure that both sides of any steps have handrails. Any raised decks and porches should have guardrails on the edges. Have any leaves, snow, or ice cleared regularly. Use sand or salt on walking paths during winter. Clean up any spills in your garage right away. This includes oil or grease spills. What can I do in the bathroom? Use night lights. Install grab bars by the toilet and in the tub and shower. Do not use towel bars as grab bars. Use non-skid mats or  decals in the tub or shower. If you need to sit down in the shower, use a plastic, non-slip stool. Keep the floor dry. Clean up any water that spills on the floor as soon as it happens. Remove soap buildup in the tub or shower regularly. Attach bath mats securely with double-sided non-slip rug tape. Do not have throw rugs and other things on the floor that can make you trip. What can I do in the bedroom? Use night lights. Make sure that you have a light by your bed that is easy to reach. Do not use any sheets or blankets that are too big for your bed. They should not hang down onto the floor. Have a firm chair that has side arms. You can use this for support while you get dressed. Do not have throw rugs and other things on the floor that can make you trip. What can I do in the kitchen? Clean up any spills right away. Avoid walking on wet floors. Keep items that you use a lot in easy-to-reach places. If you need to reach something above you, use a strong step  stool that has a grab bar. Keep electrical cords out of the way. Do not use floor polish or wax that makes floors slippery. If you must use wax, use non-skid floor wax. Do not have throw rugs and other things on the floor that can make you trip. What can I do with my stairs? Do not leave any items on the stairs. Make sure that there are handrails on both sides of the stairs and use them. Fix handrails that are broken or loose. Make sure that handrails are as long as the stairways. Check any carpeting to make sure that it is firmly attached to the stairs. Fix any carpet that is loose or worn. Avoid having throw rugs at the top or bottom of the stairs. If you do have throw rugs, attach them to the floor with carpet tape. Make sure that you have a light switch at the top of the stairs and the bottom of the stairs. If you do not have them, ask someone to add them for you. What else can I do to help prevent falls? Wear shoes that: Do not  have high heels. Have rubber bottoms. Are comfortable and fit you well. Are closed at the toe. Do not wear sandals. If you use a stepladder: Make sure that it is fully opened. Do not climb a closed stepladder. Make sure that both sides of the stepladder are locked into place. Ask someone to hold it for you, if possible. Clearly mark and make sure that you can see: Any grab bars or handrails. First and last steps. Where the edge of each step is. Use tools that help you move around (mobility aids) if they are needed. These include: Canes. Walkers. Scooters. Crutches. Turn on the lights when you go into a dark area. Replace any light bulbs as soon as they burn out. Set up your furniture so you have a clear path. Avoid moving your furniture around. If any of your floors are uneven, fix them. If there are any pets around you, be aware of where they are. Review your medicines with your doctor. Some medicines can make you feel dizzy. This can increase your chance of falling. Ask your doctor what other things that you can do to help prevent falls. This information is not intended to replace advice given to you by your health care provider. Make sure you discuss any questions you have with your health care provider. Document Released: 10/24/2008 Document Revised: 06/05/2015 Document Reviewed: 02/01/2014 Elsevier Interactive Patient Education  2017 [employer].

## 2022-04-08 NOTE — Progress Notes (Signed)
I connected with  [name] on 04/08/22 by a audio enabled telemedicine application and verified that I am speaking with the correct person using two identifiers.  Patient Location: Home  Provider Location: Office/Clinic  I discussed the limitations of evaluation and management by telemedicine. The patient expressed understanding and agreed to proceed.   Subjective:   [name] is a 72 y.o. female who presents for Medicare Annual (Subsequent) preventive examination.    Patient Medicare AWV questionnaire was completed by the patient on 04/06/22; I have confirmed that all information answered by patient is correct and no changes since this date.     Review of Systems     Cardiac Risk Factors include: advanced age (>76men, >30 women);dyslipidemia;hypertension;obesity (BMI >30kg/m2)     Objective:    Today's Vitals   04/08/22 1404  Weight: 177 lb (80.3 kg)   Body mass index is 33.44 kg/m.     04/08/2022    2:10 PM 09/13/2019    2:51 PM 04/04/2019    5:07 PM 11/27/2018    2:25 PM 08/16/2018    8:24 AM 07/25/2018    2:27 PM 07/27/2017    3:00 PM  Advanced Directives  Does Patient Have a Medical Advance Directive? Yes No No No Yes No No  Type of Paramedic of [place];Living will    [place];Living will    Does patient want to make changes to medical advance directive?     Yes (MAU/Ambulatory/Procedural Areas - Information given)    Copy of [place] in Chart? No - copy requested        Would patient like information on creating a medical advance directive?  Yes (Inpatient - patient defers creating a medical advance directive at this time - Information given) No - Patient declined No - Patient declined  Yes (MAU/Ambulatory/Procedural Areas - Information given) No - Patient declined    Current Medications (verified) Outpatient Encounter Medications as of 04/08/2022  Medication Sig   valsartan (DIOVAN) 80  MG tablet Take 1 tablet (80 mg total) by mouth daily. Take one half tablet 2 hours prior to mri.  Take another half tablet 30 minutes prior to MRI only if needed for continued anxiety   valsartan-hydrochlorothiazide (DIOVAN-HCT) 80-12.5 MG tablet Take 1 tablet by mouth daily. To start: just half tablet for first week.   calcium-vitamin D (OSCAL WITH D) 500-5 MG-MCG tablet Take 1 tablet by mouth daily with breakfast. (Patient not taking: Reported on 04/08/2022)   [DISCONTINUED] Alirocumab (PRALUENT) 150 MG/ML SOAJ Inject 150 mg into the skin every 14 (fourteen) days.   [DISCONTINUED] diazepam (VALIUM) 10 MG tablet Take 1 tablet (10 mg total) by mouth every 12 (twelve) hours as needed for anxiety.   No facility-administered encounter medications on file as of 04/08/2022.    Allergies (verified) Aspirin, Pollen extract, and Sulfa antibiotics   History: Past Medical History:  Diagnosis Date   Allergy    Zyrtec PRN   Anemia    Cataract    Colon polyp 03/11/2007   Diverticulosis    by colonoscopy 2009   Fibromyalgia    diagnosed by Dr. Teressa Lower. s/p rheumatology consultation   GERD (gastroesophageal reflux disease)    Headache 12/02/2021   Headache mostly all over fluctuating pressure, kind of far off feeling, feels a little off balance, lurry viison with itoff an on aching more like pressure and fullness Associated with ear symptoms maybe it all kind of runs  together Started 11/28/21    History of smoking greater than 50 pack years 12/02/2021   Social History  Tobacco Use Smoking Status Former  Packs/day: 2.00  Years: 27.00  Total pack years: 54.00  Types: Cigarettes  Quit date: 10/25/1994  Years since quitting: 27.1 Smokeless Tobacco Never     Hyperlipidemia    Hypertension    IBS (irritable bowel syndrome)    diarrhea, constipation alternating; s/p GI consult.   Internal hemorrhoid    colonoscopy in 2009   Statin intolerance 12/02/2021   Past Surgical History:   Procedure Laterality Date   CESAREAN SECTION     COLONOSCOPY     ENDOVENOUS ABLATION SAPHENOUS VEIN W/ LASER Right 08/17/2016   endovenous laser ablation right greater saphenous vein by Tinnie Gens MD    MYOMECTOMY     TUBAL LIGATION     UPPER GASTROINTESTINAL ENDOSCOPY     Family History  Problem Relation Age of Onset   Hypertension Mother    Stroke Mother        multiple CVAs/TIAs   Heart disease Mother 68       CHF   Cancer Father        prostate cancer   Hypertension Sister    Hyperlipidemia Sister    Glaucoma Sister    Hypertension Brother    Prostatitis Brother    Hypertension Maternal Grandmother    Hypertension Maternal Grandfather    Hypertension Sister    Esophageal cancer Neg Hx    Rectal cancer Neg Hx    Stomach cancer Neg Hx    Social History   Socioeconomic History   Marital status: Married    Spouse name: Elisabeth Cara   Number of children: 3   Years of education: Not on file   Highest education level: Some college, no degree  Occupational History   Occupation: retired  Tobacco Use   Smoking status: Former    Packs/day: 2.00    Years: 27.00    Additional pack years: 0.00    Total pack years: 54.00    Types: Cigarettes    Quit date: 10/25/1994    Years since quitting: 27.4   Smokeless tobacco: Never  Vaping Use   Vaping Use: Never used  Substance and Sexual Activity   Alcohol use: No    Alcohol/week: 0.0 standard drinks of alcohol   Drug use: No   Sexual activity: Yes    Birth control/protection: Post-menopausal    Comment: 1st intercourse 61 yo-1 partner  Other Topics Concern   Not on file  Social History Narrative   Marital status: married x 1969; happily married; no abuse     Children: 3 children; 1 grandchildren (64yo).      Lives: with husband, son, daughter      Employment:  Homemaker      Tobacco: quit smoking in 1997; smoked x 30 years.      Alcohol: none      Drugs: none      Exercise: walking three days per week 2019       Seatbelt: 100%     Guns:  None     ADLs: independent with ADLs; no assistant device      Advance Directives: none; desires FULL CODE; no prolonged measures.      Patient is right-handed. She walks 3 x a week.    Social Determinants of Health   Financial Resource Strain: Low Risk  (04/08/2022)   Overall Financial Resource Strain (CARDIA)  Difficulty of Paying Living Expenses: Not hard at all  Food Insecurity: Patient Declined (04/06/2022)   Hunger Vital Sign    Worried About Running Out of Food in the Last Year: Patient declined    [place] in the Last Year: Patient declined  Transportation Needs: No Transportation Needs (04/06/2022)   PRAPARE - Hydrologist (Medical): No    Lack of Transportation (Non-Medical): No  Physical Activity: Insufficiently Active (04/06/2022)   Exercise Vital Sign    Days of Exercise per Week: 2 days    Minutes of Exercise per Session: 20 min  Stress: Stress Concern Present (04/06/2022)   [place]    Feeling of Stress : Very much  Social Connections: Unknown (04/06/2022)   Social Connection and Isolation Panel [NHANES]    Frequency of Communication with Friends and Family: Once a week    Frequency of Social Gatherings with Friends and Family: Patient declined    Attends Religious Services: 1 to 4 times per year    Active Member of [employer] or Organizations: Yes    Attends Archivist Meetings: Patient declined    Marital Status: Married    Tobacco Counseling Counseling given: Not Answered   Clinical Intake:  Pre-visit preparation completed: Yes  Pain : No/denies pain     BMI - recorded: 33.44 Nutritional Status: BMI > 30  Obese Nutritional Risks: None Diabetes: No  How often do you need to have someone help you when you read instructions, pamphlets, or other written materials from your doctor or pharmacy?: 1 -  Never  Diabetic?no  Interpreter Needed?: No  Information entered by :: Charlott Rakes, LPN   Activities of Daily Living    04/06/2022   12:08 AM  In your present state of health, do you have any difficulty performing the following activities:  Hearing? 1  Comment slight loss  Vision? 1  Comment cataracts found  Difficulty concentrating or making decisions? 1  Walking or climbing stairs? 0  Dressing or bathing? 0  Doing errands, shopping? 0  Preparing Food and eating ? N  Using the Toilet? N  In the past six months, have you accidently leaked urine? Y  Comment wears a liner  Do you have problems with loss of bowel control? N  Managing your Medications? N  Managing your Finances? N  Housekeeping or managing your Housekeeping? N    Patient Care Team: Loralee Pacas, MD as PCP - General (Internal Medicine) Pieter Partridge, DO as Consulting Physician (Neurology) Criselda Peaches, DPM as Consulting Physician (Podiatry)  Indicate any recent Medical Services you may have received from other than [facility] providers in the past year (date may be approximate).     Assessment:   This is a routine wellness examination for [place].  Hearing/Vision screen Hearing Screening - Comments:: Pt stated slight hearing issues  Vision Screening - Comments:: Pt follows up with Dr Katy Fitch for annual eye exam   Dietary issues and exercise activities discussed: Current Exercise Habits: Home exercise routine, Type of exercise: walking, Time (Minutes): 20, Frequency (Times/Week): 2, Weekly Exercise (Minutes/Week): 40   Goals Addressed             This Visit's Progress    Patient Stated       Eat right. Lose weight  and exercise        Depression Screen    04/08/2022    2:08 PM  12/02/2021    2:17 PM 02/27/2020    3:14 PM 12/20/2019    2:22 PM 11/05/2019    2:13 PM 08/02/2019    1:32 PM 05/02/2019    8:11 AM  PHQ 2/9 Scores  PHQ - 2 Score 0 0 1 0 0 0 0    Fall Risk    04/06/2022    12:08 AM 12/02/2021    2:17 PM 02/27/2020    3:13 PM 12/20/2019    2:22 PM 11/05/2019    2:13 PM  Fall Risk   Falls in the past year? 0 0 0 0 1  Number falls in past yr:  0 0 0 0  Injury with Fall? 0 0 0 0 0  Risk for fall due to : Impaired vision;Impaired balance/gait No Fall Risks     Follow up Falls prevention discussed Falls evaluation completed Falls evaluation completed Falls evaluation completed Falls evaluation completed    [place]:  Any stairs in or around the home? No  If so, are there any without handrails? No  Home free of loose throw rugs in walkways, pet beds, electrical cords, etc? Yes  Adequate lighting in your home to reduce risk of falls? Yes   ASSISTIVE DEVICES UTILIZED TO PREVENT FALLS:  Life alert? No  Use of a cane, walker or w/c? No  Grab bars in the bathroom? No  Shower chair or bench in shower? No  Elevated toilet seat or a handicapped toilet? No   TIMED UP AND GO:  Was the test performed? No .   Cognitive Function:        04/08/2022    2:11 PM 08/16/2018    8:27 AM  6CIT Screen  What Year? 0 points 0 points  What month? 0 points 0 points  What time? 0 points 0 points  Count back from 20 0 points   Months in reverse 0 points 0 points  Repeat phrase 0 points 0 points  Total Score 0 points     Immunizations Immunization History  Administered Date(s) Administered   Fluad Quad(high Dose 65+) 10/12/2018, 12/02/2021   Influenza, High Dose Seasonal PF 10/13/2017, 09/27/2018   Influenza,inj,Quad PF,6+ Mos 11/10/2015, 09/14/2016   Influenza-Unspecified 03/14/2015, 09/27/2018   PFIZER(Purple Top)SARS-COV-2 Vaccination 03/09/2019, 04/04/2019, 01/02/2020   Pneumococcal Conjugate-13 03/14/2015   Pneumococcal Polysaccharide-23 07/12/2016   Tdap 12/04/2013    TDAP status: Up to date  Flu Vaccine status: Up to date  Pneumococcal vaccine status: Up to date  Covid-19 vaccine status: Completed  vaccines  Qualifies for Shingles Vaccine? Yes   Zostavax completed No   Shingrix Completed?: No.    Education has been provided regarding the importance of this vaccine. Patient has been advised to call insurance company to determine out of pocket expense if they have not yet received this vaccine. Advised may also receive vaccine at local pharmacy or Health Dept. Verbalized acceptance and understanding.  Screening Tests Health Maintenance  Topic Date Due   Zoster Vaccines- Shingrix (1 of 2) Never done   Medicare Annual Wellness (AWV)  04/08/2023   DTaP/Tdap/Td (2 - Td or Tdap) 12/05/2023   MAMMOGRAM  03/02/2024   COLONOSCOPY (Pts 45-63yrs Insurance coverage will need to be confirmed)  05/14/2025   Pneumonia Vaccine 56+ Years old  Completed   INFLUENZA VACCINE  Completed   DEXA SCAN  Completed   Hepatitis C Screening  Completed   HPV VACCINES  Aged Out   COVID-19 Vaccine  Discontinued  Health Maintenance  Health Maintenance Due  Topic Date Due   Zoster Vaccines- Shingrix (1 of 2) Never done    Colorectal cancer screening: Type of screening: Colonoscopy. Completed 05/15/15. Repeat every 10 years  Mammogram status: Completed 2//20/24. Repeat every year  Bone Density status: Completed 07/02/20. Results reflect: Bone density results: OSTEOPENIA. Repeat every 2 years.  Additional Screening:  Hepatitis C Screening: Completed 03/18/15  Vision Screening: Recommended annual ophthalmology exams for early detection of glaucoma and other disorders of the eye. Is the patient up to date with their annual eye exam?  Yes  Who is the provider or what is the name of the office in which the patient attends annual eye exams? Dr Katy Fitch  If pt is not established with a provider, would they like to be referred to a provider to establish care? No .   Dental Screening: Recommended annual dental exams for proper oral hygiene  Community Resource Referral / Chronic Care Management: CRR required this  visit?  No   CCM required this visit?  No      Plan:     I have personally reviewed and noted the following in the patient's chart:   Medical and social history Use of alcohol, tobacco or illicit drugs  Current medications and supplements including opioid prescriptions. Patient is not currently taking opioid prescriptions. Functional ability and status Nutritional status Physical activity Advanced directives List of other physicians Hospitalizations, surgeries, and ER visits in previous 12 months Vitals Screenings to include cognitive, depression, and falls Referrals and appointments  In addition, I have reviewed and discussed with patient certain preventive protocols, quality metrics, and best practice recommendations. A written personalized care plan for preventive services as well as general preventive health recommendations were provided to patient.     Willette Brace, LPN   [id number]   Nurse Notes: none

## 2022-04-14 ENCOUNTER — Ambulatory Visit (INDEPENDENT_AMBULATORY_CARE_PROVIDER_SITE_OTHER): Payer: Medicare Other | Admitting: Internal Medicine

## 2022-04-14 ENCOUNTER — Encounter: Payer: Self-pay | Admitting: Internal Medicine

## 2022-04-14 VITALS — BP 138/82 | HR 67 | Temp 98.0°F | Ht 61.0 in | Wt 177.6 lb

## 2022-04-14 DIAGNOSIS — Z87891 Personal history of nicotine dependence: Secondary | ICD-10-CM

## 2022-04-14 DIAGNOSIS — E785 Hyperlipidemia, unspecified: Secondary | ICD-10-CM

## 2022-04-14 DIAGNOSIS — M25551 Pain in right hip: Secondary | ICD-10-CM

## 2022-04-14 DIAGNOSIS — M7989 Other specified soft tissue disorders: Secondary | ICD-10-CM

## 2022-04-14 DIAGNOSIS — I10 Essential (primary) hypertension: Secondary | ICD-10-CM

## 2022-04-14 DIAGNOSIS — Z789 Other specified health status: Secondary | ICD-10-CM

## 2022-04-14 DIAGNOSIS — E7800 Pure hypercholesterolemia, unspecified: Secondary | ICD-10-CM

## 2022-04-14 DIAGNOSIS — Z5987 Material hardship due to limited financial resources, not elsewhere classified: Secondary | ICD-10-CM

## 2022-04-14 MED ORDER — TELMISARTAN 20 MG PO TABS: 20.0000 mg | ORAL_TABLET | Freq: Every day | ORAL | 3 refills | Status: AC

## 2022-04-14 MED ORDER — FUROSEMIDE 20 MG PO TABS: 20.0000 mg | ORAL_TABLET | Freq: Every day | ORAL | 3 refills | Status: AC

## 2022-04-14 MED ORDER — REPATHA 140 MG/ML ~~LOC~~ SOSY: 140.0000 mg | PREFILLED_SYRINGE | SUBCUTANEOUS | 11 refills | Status: AC

## 2022-04-14 NOTE — Assessment & Plan Note (Signed)
Individualized Hypertension Management: Stable, borderline controlled  Goal blood pressure of less than 140/90 explained Resistant/secondary hypertension workup: not necessary in my opinion Current hypertension medications:       Sig   valsartan (DIOVAN) 80 MG tablet (Taking) Take 1 tablet (80 mg total) by mouth daily. Take one half tablet 2 hours prior to mri.  Take another half tablet 30 minutes prior to MRI only if needed for continued anxiety   valsartan-hydrochlorothiazide (DIOVAN-HCT) 80-12.5 MG tablet (Taking) Take 1 tablet by mouth daily. To start: just half tablet for first week.      Taking the one with diuretic intermittently for fluid,  Adjust medications as follows:  Replace valsartan with telmisartan  Standardized hypertension counseling and management: Counseled her to limit: salt, alcohol, NSAIDS, excess body weight. Have explained risks of poor control are FUTURE stroke and heart attacks Encouragement for home blood pressure monitoring Explained Red Flag symptoms for ER: if blood pressure over 180 AND new headache, shortness of breath, confusion, or chest discomfort See AFTER VISIT SUMMARY for addition educational information provided Offered to refill meds.

## 2022-04-14 NOTE — Assessment & Plan Note (Signed)
History of metal getting in lungs Defers LDCT for now, offered

## 2022-04-14 NOTE — Assessment & Plan Note (Signed)
Defers XR for now  Offered sports medication(s) she will call back if no improvement with switch to telmisartan

## 2022-04-15 NOTE — Assessment & Plan Note (Addendum)
Would like to have as needed fluid pill Limited labwork due to cost, need to be prudent and not wasteful

## 2022-04-15 NOTE — Progress Notes (Signed)
[facility]: [id number]   Routine Medical Office Visit  Patient:  [name]      Age: 72 y.o.       Sex:  female  Date:   04/15/2022  PCP:    Loralee Pacas, [place] Provider: Loralee Pacas, MD   Assessment and Plan:   Hypertension, unspecified type Assessment & Plan: Individualized Hypertension Management: Stable, borderline controlled  Goal blood pressure of less than 140/90 explained Resistant/secondary hypertension workup: not necessary in my opinion Current hypertension medications:       Sig   valsartan (DIOVAN) 80 MG tablet (Taking) Take 1 tablet (80 mg total) by mouth daily. Take one half tablet 2 hours prior to mri.  Take another half tablet 30 minutes prior to MRI only if needed for continued anxiety   valsartan-hydrochlorothiazide (DIOVAN-HCT) 80-12.5 MG tablet (Taking) Take 1 tablet by mouth daily. To start: just half tablet for first week.      Taking the one with diuretic intermittently for fluid,  Adjust medications as follows:    Replace valsartan with telmisartan, I've had good luck with this causing low side effect(s) in patients who were sensitive to many medication(s)   Standardized hypertension counseling and management: Counseled her to limit: salt, alcohol, NSAIDS, excess body weight. Have explained risks of poor control are FUTURE stroke and heart attacks Encouragement for home blood pressure monitoring Explained Red Flag symptoms for ER: if blood pressure over 180 AND new headache, shortness of breath, confusion, or chest discomfort See AFTER VISIT SUMMARY for addition educational information provided Offered to refill meds.   Orders: -     Telmisartan; Take 1 tablet (20 mg total) by mouth daily. Replaces valsartan and valsartan-hctz  Dispense: 90 tablet; Refill: 3 -     Basic metabolic panel -     CK  Right hip pain Assessment & Plan: Defers XR for now  Offered sports medication(s) she will  call back if no improvement with switch to telmisartan  Orders: -     Sedimentation rate -     C-reactive protein  Statin intolerance  Hyperlipidemia, acquired -     Repatha; Inject 140 mg into the skin every 14 (fourteen) days.  Dispense: 2.1 mL; Refill: 11  Leg swelling Assessment & Plan: Would like to have as needed fluid pill Limited labwork due to cost, need to be prudent and not wasteful  Orders: -     Furosemide; Take 1 tablet (20 mg total) by mouth daily. Only as needed for leg swelling  Dispense: 30 tablet; Refill: 3  Pure hypercholesterolemia  History of smoking greater than 50 pack years Assessment & Plan: History of metal getting in lungs Defers LDCT for now, offered   Unable to obtain healthcare due to limited financial resources Assessment & Plan: I will continue to engage with her respecting her values and preferences, reassured I will be looking forward to seeing her soon as she feels she is able.        Clinical Presentation:   72 y.o. female here today for 1 month follow-up  Reviewed:  has a past medical history of Allergy, Anemia, Cataract, Colon polyp (03/11/2007), Diverticulosis, Fibromyalgia, GERD (gastroesophageal reflux disease), Headache (12/02/2021), History of smoking greater than 50 pack years (12/02/2021), Hyperlipidemia, Hypertension, IBS (irritable bowel syndrome), Internal hemorrhoid, and Statin intolerance (12/02/2021). Active Ambulatory Problems    Diagnosis Date Noted   Hypertensive disorder 08/10/2015   Pure hypercholesterolemia 08/10/2015  Glucose intolerance (impaired glucose tolerance) 08/10/2015   Varicose veins of right lower extremity with complications [id number]   Inflamed external hemorrhoid 07/24/2016   Irritable bowel syndrome with diarrhea 09/18/2016   Urge incontinence of urine 09/18/2016   Gastroesophageal reflux disease without esophagitis 07/28/2017   Osteopenia of right hip 08/02/2017   Hyperlipidemia, acquired  12/09/2017   Obesity due to energy imbalance 12/09/2017   Muscle cramps 12/09/2017   Headache 12/02/2021   Word finding difficulty 12/02/2021   Swelling 12/02/2021   Statin intolerance 12/02/2021   Diverticulosis 12/02/2021   History of smoking greater than 50 pack years 12/02/2021   Leucopenia 12/04/2021   Prediabetes 12/04/2021   Subungual hematoma of toe 12/09/2021   Vitamin D deficiency 12/09/2021   Asymptomatic microscopic hematuria 02/01/2022   Panic attack 02/01/2022   Right hip pain 04/14/2022   Unable to obtain healthcare due to limited financial resources 04/14/2022   Leg swelling 04/14/2022   Resolved Ambulatory Problems    Diagnosis Date Noted   Melanonychia 02/01/2022   Abnormal finding on urinalysis 02/01/2022   Past Medical History:  Diagnosis Date   Allergy    Anemia    Cataract    Colon polyp 03/11/2007   Fibromyalgia    GERD (gastroesophageal reflux disease)    Hyperlipidemia    Hypertension    IBS (irritable bowel syndrome)    Internal hemorrhoid     Outpatient Medications Prior to Visit  Medication Sig   calcium-vitamin D (OSCAL WITH D) 500-5 MG-MCG tablet Take 1 tablet by mouth daily with breakfast.   [DISCONTINUED] valsartan (DIOVAN) 80 MG tablet Take 1 tablet (80 mg total) by mouth daily. Take one half tablet 2 hours prior to mri.  Take another half tablet 30 minutes prior to MRI only if needed for continued anxiety   [DISCONTINUED] valsartan-hydrochlorothiazide (DIOVAN-HCT) 80-12.5 MG tablet Take 1 tablet by mouth daily. To start: just half tablet for first week.   No facility-administered medications prior to visit.    HPI  Updated and modified:  Problem  Right Hip Pain   Chronic ? If due to medication(s)    Unable to [employer] Due to [employer]   Was unable to cover 200$ monthly cholesterol medication(s) Wants to hold off on LDCT over cost Prefers to avoid monthly follow up that is requested by Primary Care  Provider (PCP) due to financial limitations Limiting labwork due to cost     Leg Swelling   Mild intermittent chronic, no history chf   History of Smoking Greater Than 50 Pack Years   Social History   Tobacco Use  Smoking Status Former   Packs/day: 2.00   Years: 27.00   Total pack years: 54.00   Types: Cigarettes   Quit date: 10/25/1994   Years since quitting: 27.1  Smokeless Tobacco Never      Hypertensive Disorder   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 Joint aches Losartan. Amlodipine fatigued, lightheaded, leg swelling. Metoprolol joint aches. Does not desire diuretic due to overactive bladder, but does have desire to keep fluid off Clonidine with constipation, dry mouth.  Valsartan seems to cause joint pain              Clinical Data Analysis:   Physical Exam  BP 138/82 (BP Location: Left Arm, Patient Position: Sitting)   Pulse 67   Temp 98 F (36.7 C) (Temporal)   Ht 5\' 1"  (1.549 m)   Wt 177 lb 9.6  oz (80.6 kg)   SpO2 98%   BMI 33.56 kg/m  Wt Readings from Last 10 Encounters:  04/14/22 177 lb 9.6 oz (80.6 kg)  04/08/22 177 lb (80.3 kg)  02/01/22 177 lb 12.8 oz (80.6 kg)  12/09/21 180 lb (81.6 kg)  12/02/21 179 lb 3.2 oz (81.3 kg)  02/27/20 167 lb (75.8 kg)  12/20/19 166 lb (75.3 kg)  11/05/19 167 lb 3.2 oz (75.8 kg)  08/10/19 168 lb (76.2 kg)  08/02/19 170 lb (77.1 kg)   Vital signs reviewed.  Nursing notes reviewed. Weight trend reviewed. Abnormalities and Problem-Specific physical exam findings:  intelligent General Appearance:  No acute distress appreciable.   Well-groomed, healthy-appearing female.  Well proportioned with no abnormal fat distribution.  Good muscle tone. Skin: Clear and well-hydrated. Pulmonary:  Normal work of breathing at rest, no respiratory distress apparent. SpO2: 98 %  Musculoskeletal: Patient demonstrates smooth and coordinated movements  throughout all major joints.All extremities are intact.  Neurological:  Awake, alert, oriented, and engaged.  No obvious focal neurological deficits or cognitive impairments.  Sensorium seems unclouded. Gait is smooth and coordinated.  Speech is clear and coherent with logical content. Psychiatric:  Appropriate mood, pleasant and cooperative demeanor, cheerful and engaged during the exam    Additional Results Reviewed:  {   MM DIAG BREAST TOMO BILATERAL  Result Date: 03/02/2022 CLINICAL DATA:  72 year old female presenting for evaluation of focal pain intermittent redness in the medial left breast. Patient reports she has tenderness today but no redness. EXAM: DIGITAL DIAGNOSTIC BILATERAL MAMMOGRAM WITH TOMOSYNTHESIS; ULTRASOUND LEFT BREAST LIMITED TECHNIQUE: Bilateral digital diagnostic mammography and breast tomosynthesis was performed.; Targeted ultrasound examination of the left breast was performed. COMPARISON:  Previous exam(s). ACR Breast Density Category b: There are scattered areas of fibroglandular density. FINDINGS: Mammogram: Right breast: No suspicious mass, distortion, or microcalcifications are identified to suggest presence of malignancy. Left breast: A skin BB marks the site of pain reported by the patient in the medial right breast. A spot tangential view of this area was performed in addition to standard views. There is no new abnormality at the site of pain or elsewhere in the left breast to suggest the presence of malignancy. On physical exam of the medial left breast I do not feel a discrete mass or focal area of thickening. Ultrasound: Targeted ultrasound performed at the site of pain reported by the patient in the left breast at 9-10 o'clock demonstrating no cystic or solid mass. IMPRESSION: 1. No mammographic or sonographic evidence of malignancy or other imaging abnormality at the site of pain reported by the patient in the medial left breast. 2. No mammographic evidence of  malignancy in the right breast. RECOMMENDATION: 1. Clinical follow-up as needed for intermittent left breast pain and tenderness. Breast pain is a common condition, which will often resolve on its own without intervention. It can be affected by hormonal changes, medication side effect, weight changes and fit of the bra. Pain may also be referred from other adjacent areas of the body. Breast pain may be improved by wearing adequate well-fitting support, over-the-counter topical and oral NSAID medication, low-fat diet, and ice/heat as needed. Studies have shown an improvement in cyclic pain with use of evening primrose oil and vitamin E. 2.  Screening mammogram in one year.(Code:SM-B-01Y) I have discussed the findings and recommendations with the patient. If applicable, a reminder letter will be sent to the patient regarding the next appointment. BI-RADS CATEGORY  1: Negative. Electronically Signed  By: Audie Pinto M.D.   On: 03/02/2022 13:22   US BREAST LTD UNI LEFT INC AXILLA  Result Date: 03/02/2022 CLINICAL DATA:  72 year old female presenting for evaluation of focal pain intermittent redness in the medial left breast. Patient reports she has tenderness today but no redness. EXAM: DIGITAL DIAGNOSTIC BILATERAL MAMMOGRAM WITH TOMOSYNTHESIS; ULTRASOUND LEFT BREAST LIMITED TECHNIQUE: Bilateral digital diagnostic mammography and breast tomosynthesis was performed.; Targeted ultrasound examination of the left breast was performed. COMPARISON:  Previous exam(s). ACR Breast Density Category b: There are scattered areas of fibroglandular density. FINDINGS: Mammogram: Right breast: No suspicious mass, distortion, or microcalcifications are identified to suggest presence of malignancy. Left breast: A skin BB marks the site of pain reported by the patient in the medial right breast. A spot tangential view of this area was performed in addition to standard views. There is no new abnormality at the site of pain or  elsewhere in the left breast to suggest the presence of malignancy. On physical exam of the medial left breast I do not feel a discrete mass or focal area of thickening. Ultrasound: Targeted ultrasound performed at the site of pain reported by the patient in the left breast at 9-10 o'clock demonstrating no cystic or solid mass. IMPRESSION: 1. No mammographic or sonographic evidence of malignancy or other imaging abnormality at the site of pain reported by the patient in the medial left breast. 2. No mammographic evidence of malignancy in the right breast. RECOMMENDATION: 1. Clinical follow-up as needed for intermittent left breast pain and tenderness. Breast pain is a common condition, which will often resolve on its own without intervention. It can be affected by hormonal changes, medication side effect, weight changes and fit of the bra. Pain may also be referred from other adjacent areas of the body. Breast pain may be improved by wearing adequate well-fitting support, over-the-counter topical and oral NSAID medication, low-fat diet, and ice/heat as needed. Studies have shown an improvement in cyclic pain with use of evening primrose oil and vitamin E. 2.  Screening mammogram in one year.(Code:SM-B-01Y) I have discussed the findings and recommendations with the patient. If applicable, a reminder letter will be sent to the patient regarding the next appointment. BI-RADS CATEGORY  1: Negative. Electronically Signed   By: Audie Pinto M.D.   On: 03/02/2022 13:22  MR Brain W Wo Contrast  Result Date: 02/09/2022 CLINICAL DATA:  Provided history: Headache, new onset. Headache, increasing frequency or severity. New headache with blurry vision. Uncontrolled blood pressure. Other headache syndrome. EXAM: MRI HEAD WITHOUT AND WITH CONTRAST TECHNIQUE: Multiplanar, multiecho pulse sequences of the brain and surrounding structures were obtained without and with intravenous contrast. CONTRAST:  8.5 mL Vueway  intravenous contrast. COMPARISON:  No pertinent prior exams available for comparison. FINDINGS: Susceptibility artifact arising from the scalp partially obscures the bilateral cerebral hemispheres on the axial susceptibility-weighted sequence. Within this limitation, findings are as follows. Brain: No age advanced or lobar predominant parenchymal atrophy. No cortical encephalomalacia is identified. No significant cerebral white matter disease for age. There is no acute infarct. No evidence of an intracranial mass. No appreciable chronic intracranial blood products. No extra-axial fluid collection. No midline shift. No pathologic intracranial enhancement identified. Vascular: Maintained flow voids within the proximal large arterial vessels. Skull and upper cervical spine: No focal suspicious marrow lesion. Incompletely assessed cervical spondylosis. Sinuses/Orbits: No mass or acute finding within the imaged orbits. No significant paranasal sinus disease. IMPRESSION: Unremarkable MRI appearance of the brain for age. No  evidence of acute intracranial abnormality. Electronically Signed   By: Kellie Simmering D.O.   On: 02/09/2022 18:32     --------------------------------    Signed: Loralee Pacas, MD 04/15/2022 8:48 PM

## 2022-04-15 NOTE — Assessment & Plan Note (Signed)
I will continue to engage with her respecting her values and preferences, reassured I will be looking forward to seeing her soon as she feels she is able.

## 2022-04-30 ENCOUNTER — Other Ambulatory Visit (HOSPITAL_COMMUNITY): Payer: Self-pay

## 2022-05-12 ENCOUNTER — Encounter: Payer: Self-pay | Admitting: Internal Medicine

## 2022-05-12 ENCOUNTER — Ambulatory Visit (INDEPENDENT_AMBULATORY_CARE_PROVIDER_SITE_OTHER): Payer: Medicare Other | Admitting: Internal Medicine

## 2022-05-12 VITALS — BP 140/88 | HR 68 | Temp 98.4°F | Ht 61.0 in | Wt 178.4 lb

## 2022-05-12 DIAGNOSIS — G8929 Other chronic pain: Secondary | ICD-10-CM | POA: Diagnosis not present

## 2022-05-12 DIAGNOSIS — M5442 Lumbago with sciatica, left side: Secondary | ICD-10-CM | POA: Diagnosis not present

## 2022-05-12 DIAGNOSIS — I10 Essential (primary) hypertension: Secondary | ICD-10-CM | POA: Diagnosis not present

## 2022-05-12 DIAGNOSIS — M5441 Lumbago with sciatica, right side: Secondary | ICD-10-CM

## 2022-05-12 HISTORY — DX: Other chronic pain: G89.29

## 2022-05-12 MED ORDER — MELOXICAM 7.5 MG PO TABS: 7.5000 mg | ORAL_TABLET | Freq: Every day | ORAL | 0 refills | Status: AC

## 2022-05-12 MED ORDER — PREDNISONE 20 MG PO TABS: ORAL_TABLET | ORAL | 0 refills | Status: AC

## 2022-05-12 MED ORDER — METHOCARBAMOL 500 MG PO TABS: 500.0000 mg | ORAL_TABLET | Freq: Four times a day (QID) | ORAL | 2 refills | Status: AC

## 2022-05-12 NOTE — Assessment & Plan Note (Addendum)
Severe flare R side Explained the NSAIDs are nonsteroidal anti-inflammatory drugs that can help with pain and inflammation coming from various different causes of arthritis.  In her case I think it is her low back but it could be arthritis coming from a different place or another form of inflammation causing the pain.  Regardless of the cause NSAIDs should relieve the pain but they do have the drawback risks of they can cause heartburn and they are in the long run not good for the kidneys but for a brief burst I think the reasonable so I give her a few I also encouraged her to try prednisone which can knock down inflammation but is also only safe for short burst In the longer term I think she will need physical therapy to prevent this from flaring I doubt it will need surgery but I recommend doing an x-ray of the hip and low back for now and then if physical therapy does not help and it continues we can get an MRI and see if surgery might be necessary but again I think that is unlikely allergies so we cannot do Celebrex due to the sulfa allergy and wound I was reluctant to do any NSAIDs due to the aspirin allergy but she says she can take ibuprofen without difficulty so we will try Mobic at a low dose.  I also encouraged physical therapy and we discussed the risks and benefits of that and how it could help for long-term recovery and she intends to do so but asks that we delay it for now until she can get caught up financially

## 2022-05-12 NOTE — Assessment & Plan Note (Signed)
Reviewed available data from patient and  BP Readings from Last 3 Encounters:  05/12/22 (!) 140/88  04/14/22 138/82  02/01/22 122/69   My individualized, goal average blood pressure for this patient, after considering the evidence for and against aggressive blood pressure goals as well as their past medical history and preferences, is 140/90 In my medical opinion, this problem is stable, borderline controlled  Will continue the current medication(s), unchanged: Offered to inject but she wants to keep trying it a little longer and she will check her blood pressures at home and come back and see me in another 1 to 3 months Current hypertension medications:       Sig   furosemide (LASIX) 20 MG tablet (Taking) Take 1 tablet (20 mg total) by mouth daily. Only as needed for leg swelling   telmisartan (MICARDIS) 20 MG tablet (Taking) Take 1 tablet (20 mg total) by mouth daily. Replaces valsartan and valsartan-hctz      This was decided by careful and shared decision making based on collaborative review of their situation and the associated data.   Information for patient review: Please limit and avoid: salt, alcohol, NSAIDS, excess body weight, smoking, stress, sedentary lifestyles The risks of poor control over time are FUTURE stroke and heart attacks, but if you have a blood pressure over 180 and any red flag symptoms(headache/shortness of breath/confusion/chest discomfort) you should go to the ER.  You are encouraged to do home blood pressure monitoring, at least as many times per week as blood pressure medications you are on.  For example, bring a diary with 3 home blood pressure readings per week to each visit if you are on 3 blood pressure medications.   If you are on more than 3 medication(s) or have certain risk factors, we should do a resistant hypertension workup See AFTER VISIT SUMMARY for addition educational information provided Please let me know in advance when you need medication(s)  refills, consistently taking your medication is very important!

## 2022-05-12 NOTE — Patient Instructions (Addendum)
It was a pleasure seeing you today!  Your health and satisfaction are my top priorities. If you believe your experience today was worthy of a 5-star rating, I'd be grateful for your feedback! Lula Olszewski, MD   Next Steps: Schedule Follow-Up:  If any of your medical issues become urgent or worsen, please don't hesitate to reach out or seek emergency room care. In the meantime, I strongly encourage scheduling routine follow up appointments prior to leaving or calling [phone] if you don't have one scheduled yet.  We recommend your next follow-up appointment no later than Return for chronic disease monitoring and management 1 to 3 months.  Please return sooner if you are not doing well.  Preventive Care:  Don't forget to schedule your annual preventive care visit!  This important checkup is typically covered by insurance and helps identify potential health issues early.  Typically its 100% insurance covered with no co-pay and helps to get surveillance labwork paid for.  Lab & X-ray Appointments:  Scheduled any incomplete lab tests today or call us to schedule.  XRays can be done without an appointment at [facility] ([address], [address]), M-F 8:30am-noon or 1pm-5pm.  Just tell them you're there for X-rays ordered by Dr. Jon Billings.  We'll receive the results and contact you by phone or MyChart to discuss next steps.  Medical Information Release:  If you have any relevant medical information we don't have, please sign a release form so we can obtain it for your records.  Bring to Your Next Appointment: Medications: Please bring all your medication bottles to your next appointment to ensure we have an accurate record of your prescriptions. Health Diaries: If you're monitoring any health conditions at home, keeping a diary of your readings can be very helpful for discussions at your next appointment.  Please Review your early draft clinical notes below and the final encounter  summary tomorrow on MyChart after its been completed.   Chronic right-sided low back pain with bilateral sciatica Assessment & Plan: Severe flare R side Explained the NSAIDs are nonsteroidal anti-inflammatory drugs that can help with pain and inflammation coming from various different causes of arthritis.  In her case I think it is her low back but it could be arthritis coming from a different place or another form of inflammation causing the pain.  Regardless of the cause NSAIDs should relieve the pain but they do have the drawback risks of they can cause heartburn and they are in the long run not good for the kidneys but for a brief burst I think the reasonable so I give her a few I also encouraged her to try prednisone which can knock down inflammation but is also only safe for short burst In the longer term I think she will need physical therapy to prevent this from flaring I doubt it will need surgery but I recommend doing an x-ray of the hip and low back for now and then if physical therapy does not help and it continues we can get an MRI and see if surgery might be necessary but again I think that is unlikely allergies so we cannot do Celebrex due to the sulfa allergy and wound I was reluctant to do any NSAIDs due to the aspirin allergy but she says she can take ibuprofen without difficulty so we will try Mobic at a low dose.  I also encouraged physical therapy and we discussed the risks and benefits of that and how  it could help for long-term recovery and she intends to do so but asks that we delay it for now until she can get caught up financially     Hypertension, unspecified type Assessment & Plan: Reviewed available data from patient and  BP Readings from Last 3 Encounters:  05/12/22 (!) 140/88  04/14/22 138/82  02/01/22 122/69   My individualized, goal average blood pressure for this patient, after considering the evidence for and against aggressive blood pressure goals as well as  their past medical history and preferences, is 140/90 In my medical opinion, this problem is stable, borderline controlled  Will continue the current medication(s), unchanged: Offered to inject but she wants to keep trying it a little longer and she will check her blood pressures at home and come back and see me in another 1 to 3 months Current hypertension medications:       Sig   furosemide (LASIX) 20 MG tablet (Taking) Take 1 tablet (20 mg total) by mouth daily. Only as needed for leg swelling   telmisartan (MICARDIS) 20 MG tablet (Taking) Take 1 tablet (20 mg total) by mouth daily. Replaces valsartan and valsartan-hctz      This was decided by careful and shared decision making based on collaborative review of their situation and the associated data.   Information for patient review: Please limit and avoid: salt, alcohol, NSAIDS, excess body weight, smoking, stress, sedentary lifestyles The risks of poor control over time are FUTURE stroke and heart attacks, but if you have a blood pressure over 180 and any red flag symptoms(headache/shortness of breath/confusion/chest discomfort) you should go to the ER.  You are encouraged to do home blood pressure monitoring, at least as many times per week as blood pressure medications you are on.  For example, bring a diary with 3 home blood pressure readings per week to each visit if you are on 3 blood pressure medications.   If you are on more than 3 medication(s) or have certain risk factors, we should do a resistant hypertension workup See AFTER VISIT SUMMARY for addition educational information provided Please let me know in advance when you need medication(s) refills, consistently taking your medication is very important!       Getting Answers and Following Up: Simple Questions & Concerns: For quick questions or basic follow-up after your visit, reach [place] at [phone] or MyChart messaging. Complex Concerns: If your concern is more complex,  scheduling an appointment might be best. Discuss this with the staff to find the most suitable option. Lab & Imaging Results: We'll contact you directly if results are abnormal or you don't use MyChart. Most normal results will be on MyChart within 2-3 business days, with a review message from Dr. Jon Billings. Haven't heard back in 2 weeks? Need results sooner? Contact us at [phone]. Referrals: Our referral coordinator will manage specialist referrals. The specialist's office should contact you within 2 weeks to schedule an appointment. Call us if you haven't heard from them after 2 weeks.  Staying Connected:  MyChart: Activate your MyChart for the fastest way to access results and message [place]. See the last page of this paperwork for instructions.  Billing: X-ray & Lab Orders: These are billed by separate companies. Contact the invoicing company directly for questions or concerns. Visit Charges: Discuss any billing inquiries with our administrative services team.  Feedback & Satisfaction: Share Your Experience: We strive for your satisfaction! If you have any complaints, please let Dr. Jon Billings know directly or  contact our Practice Administrators, Edwena Felty or [employer], by asking at the front desk.  Scheduling Tips: Shorter Wait Times: 8 am and 1 pm appointments often have the quickest wait times. Longer Appointments: If you need more time during your visit, talk to the front desk. Due to insurance regulations, multiple back-to-back appointments might be necessary.

## 2022-05-13 NOTE — Progress Notes (Signed)
[facility]: [phone]   Routine Medical Office Visit  Patient:  [name]      Age: 72 y.o.       Sex:  female  Date:   05/12/2022 PCP:    Lula Olszewski, MD   Today's Healthcare Provider: Lula Olszewski, MD       Assessment and Plan:   Chronic right-sided low back pain with bilateral sciatica Assessment & Plan: Assessment   Suspected musculoskeletal pain, possibly related to arthritis (location to be determined). Discussed the differential diagnoses, including other potential causes of inflammation.    - speculate this is sciatica pain, but meragia paresthetica, hip arthritis, and other causes considered Acknowledged limitations in definitively diagnosing the source without further investigation.  -----------------------    Plan   Medication: Initiated a short course of Mobic (meloxicam), a nonsteroidal anti-inflammatory drug (NSAID), to manage pain and inflammation. Discussed potential side effects of NSAIDs, including gastrointestinal upset and long-term kidney concerns. Documented patient's reported tolerance to ibuprofen and allergies to aspirin and sulfa drugs, guiding medication selection. Considered prednisone for more potent anti-inflammatory effects, but advised its limitations for long-term use. Will trial muscle relaxant with minimal sedative risk. Imaging: Recommended X-rays of the hip and low back to evaluate for potential structural abnormalities contributing to the pain. Discussed the possibility of an MRI in the future if X-rays are unremarkable and symptoms persist despite physical therapy. Physical Therapy: Strongly encouraged physical therapy for long-term management and prevention of future flares. Discussed the benefits of physical therapy in improving mobility and reducing pain. Acknowledged the patient's financial concerns and agreed to defer initiation of physical therapy until financially feasible. Advised the  patient to contact the office when ready to proceed with physical therapy. Follow-Up: Scheduled a follow-up appointment in 1-3 months  to reassess the response to treatment and determine the need for further interventions.  --------------------     Orders: -     DG Lumb Spine Flex&Ext Only; Future -     XR HIP UNILAT W OR W/O PELVIS 2-3 VIEWS LEFT -     Methocarbamol; Take 1 tablet (500 mg total) by mouth 4 (four) times daily.  Dispense: 60 tablet; Refill: 2 -     Meloxicam; Take 1 tablet (7.5 mg total) by mouth daily.  Dispense: 30 tablet; Refill: 0 -     predniSONE; Take 2 pills for 3 days, 1 pill for 4 days  Dispense: 10 tablet; Refill: 0 -     Ambulatory referral to Physical Therapy  Hypertension, unspecified type Assessment & Plan: Reviewed available data from patient and  BP Readings from Last 3 Encounters:  05/12/22 (!) 140/88  04/14/22 138/82  02/01/22 122/69   My individualized, goal average blood pressure for this patient, after considering the evidence for and against aggressive blood pressure goals as well as their past medical history and preferences, is 140/90 In my medical opinion, this problem is stable, borderline controlled  Will continue the current medication(s), unchanged: Offered to inject but she wants to keep trying it a little longer and she will check her blood pressures at home and come back and see me in another 1 to 3 months Current hypertension medications:       Sig   furosemide (LASIX) 20 MG tablet (Taking) Take 1 tablet (20 mg total) by mouth daily. Only as needed for leg swelling   telmisartan (MICARDIS) 20 MG tablet (Taking) Take 1 tablet (20 mg total) by mouth daily. Replaces  valsartan and valsartan-hctz      This was decided by careful and shared decision making based on collaborative review of their situation and the associated data.    Information for patient review: Please limit and avoid: salt, alcohol, NSAIDS, excess body weight, smoking,  stress, sedentary lifestyles The risks of poor control over time are FUTURE stroke and heart attacks, but if you have a blood pressure over 180 and any red flag symptoms(headache/shortness of breath/confusion/chest discomfort) you should go to the ER.  You are encouraged to do home blood pressure monitoring, at least as many times per week as blood pressure medications you are on.  For example, bring a diary with 3 home blood pressure readings per week to each visit if you are on 3 blood pressure medications.   If you are on more than 3 medication(s) or have certain risk factors, we should do a resistant hypertension workup See AFTER VISIT SUMMARY for addition educational information provided Please let me know in advance when you need medication(s) refills, consistently taking your medication is very important!       Treatment plan discussed and reviewed in detail. Explained medication safety and potential side effects. Agreed on patient returning to office if symptoms worsen, persist, or new symptoms develop.  Answered all patient questions and confirmed understanding and comfort with the plan. Encouraged patient to contact our office if they have any questions or concerns.         Clinical Presentation:   72 y.o. female here today for 1 month follow-up, Hip Pain (Mostly right side.), and Lower left side back pain  HPI   Updated chart data:  Problem  Chronic Low Back Pain With Bilateral Sciatica   She gets shooting discomfort down to her foot but today it is mainly right sided from the lower back down around the outside of the right hip into the right medial thigh most consistent with an L2 nerve inflammation and she has been told that she has a bulging disc before and this flare of this pain going on down on the right hip area has been going on for about a year right now its concentrated at the moment is her most troublesome an annoying problem.  In the past she has tried wrapping them  topicals heating pads vibrations to variable benefit.  She tried Tylenol and it did nothing No history of chronic kidney disease or other contraindication to taking an NSAID and has not been taking them something about Aleve she cannot handle  Last imaging relevant: EXAM: DUAL X-RAY ABSORPTIOMETRY (DXA) FOR BONE MINERAL DENSITY   IMPRESSION: Referring Physician:  Tobie Poet Your patient completed a bone mineral density test using GE Lunar iDXA system (analysis version: 16). Technologist: SEC PATIENT: Name: Dewey, Neukam Patient ID: [id number] Birth Date: 1950/10/15 Height: 62.0 in. Sex: Female Measured: 07/02/2020 Weight: 165.4 lbs. Indications: Advanced Age, Estrogen Deficient, Postmenopausal Fractures: None Treatments: Vitamin D (E933.5)   ASSESSMENT: The BMD measured at Femur Neck Right is 0.780 g/cm2 with a T-score of -1.9. This patient is considered osteopenic/low bone mass according to World Health Organization [facility]) criteria.   The quality of the exam is good. L3, L4 were excluded due to degenerative changes.   Site Region Measured Date Measured Age YA BMD Significant CHANGE T-score DualFemur Neck Right 07/02/2020    70.9         -1.9    0.780 g/cm2 DualFemur Neck Right 08/31/2016    67.1         -  2.1    0.752 g/cm2   AP Spine  L1-L2      07/02/2020    70.9         -1.4    1.008 g/cm2 AP Spine  L1-L2      08/31/2016    67.1         -1.6    0.983 g/cm2   DualFemur Total Mean 07/02/2020    70.9         -0.8    0.903 g/cm2 DualFemur Total Mean 08/31/2016    67.1         -0.8    0.901 g/cm2   World Health Organization [facility]) criteria for post-menopausal, Caucasian Women: Normal       T-score at or above -1 SD Osteopenia   T-score between -1 and -2.5 SD Osteoporosis T-score at or below -2.5 SD   RECOMMENDATION: 1. All patients should optimize calcium and vitamin D intake. 2. Consider FDA-approved medical therapies in postmenopausal women and men aged 51 years  and older, based on the following: a. A hip or vertebral (clinical or morphometric) fracture. b. T-score = -2.5 at the femoral neck or spine after appropriate evaluation to exclude secondary causes. c. Low bone mass (T-score between -1.0 and -2.5 at the femoral neck or spine) and a 10-year probability of a hip fracture = 3% or a 10-year probability of a major osteoporosis-related fracture = 20% based on the [place]-adapted WHO algorithm. d. Clinician judgment and/or patient preferences may indicate treatment for people with 10-year fracture probabilities above or below these levels.   FOLLOW-UP: Patients with diagnosis of osteoporosis or at high risk for fracture should have regular bone mineral density tests.? Patients eligible for Medicare are allowed routine testing every 2 years.? The testing frequency can be increased to one year for patients who have rapidly progressing disease, are receiving or discontinuing medical therapy to restore bone mass, or have additional risk factors.   I have reviewed this study and agree with the findings. [facility]   FRAX* 10-year Probability of Fracture Based on femoral neck BMD: DualFemur (Right)   Major Osteoporotic Fracture: 5.0% Hip Fracture:                0.9% Population:                  [place] ([place]) Risk Factors:                None   *FRAX is a Armed forces logistics/support/administrative officer of the [employer] of [employer] for Metabolic Bone Disease, a World Science writer (WHO) [employer].   ASSESSMENT: The probability of a major osteoporotic fracture is 5.0% within the next ten years.   The probability of a hip fracture is 0.9% within the next ten years.   I have reviewed this report and agree with the above findings.    CLINICAL DATA:  72 year old female with right hip pain, chronic right side lumbar back pain.   EXAM: DG HIP (WITH OR WITHOUT PELVIS) 2-3V RIGHT   COMPARISON:  Lumbar radiographs  07/22/2015.   FINDINGS: Chronic 3-4 centimeter dystrophic calcification in the central pelvis likely a uterine fibroid. The femoral heads are normally located. Hip joint spaces are normal and symmetric. Proximal right femur appears normal. The pelvis is intact. Sacral ala and SI joints appear normal. Normal visible bowel gas pattern.   IMPRESSION: 1. Normal radiographic appearance of the right hip. 2. Chronic 3-4 cm calcified uterine fibroid.  Electronically Signed   By: Odessa Fleming M.D.   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 Joint aches Losartan. Amlodipine fatigued, lightheaded, leg swelling. Metoprolol joint aches. Does not desire diuretic due to overactive bladder, but does have desire to keep fluid off Clonidine with constipation, dry mouth.  Valsartan seems to cause joint pain Telmisartant may cause tinnitus Home readings: sometimes high, with  "from time-to-time checking" Patient reports taking current medications consistently and not experiencing any significant associated side effects or symptoms  As of 05/12/2022 Current hypertension medications:       Sig   furosemide (LASIX) 20 MG tablet (Taking) Take 1 tablet (20 mg total) by mouth daily. Only as needed for leg swelling   telmisartan (MICARDIS) 20 MG tablet (Taking) Take 1 tablet (20 mg total) by mouth daily. Replaces valsartan and valsartan-hctz     She doesn't like micardis and so not taking consistently the make her tired, fatigued, arthritis, tinnitus  Lab Results  Component Value Date   NA 140 12/02/2021   K 4.4 12/02/2021        Reviewed chart data: Active Ambulatory Problems    Diagnosis Date Noted   Hypertensive disorder 08/10/2015   Pure hypercholesterolemia 08/10/2015   Glucose intolerance (impaired glucose tolerance) 08/10/2015   Varicose veins of right lower extremity with  complications 12/01/2015   Inflamed external hemorrhoid 07/24/2016   Irritable bowel syndrome with diarrhea 09/18/2016   Urge incontinence of urine 09/18/2016   Gastroesophageal reflux disease without esophagitis 07/28/2017   Osteopenia of right hip 08/02/2017   Hyperlipidemia, acquired 12/09/2017   Obesity due to energy imbalance 12/09/2017   Muscle cramps 12/09/2017   Headache 12/02/2021   Word finding difficulty 12/02/2021   Swelling 12/02/2021   Statin intolerance 12/02/2021   Diverticulosis 12/02/2021   History of smoking greater than 50 pack years 12/02/2021   Leucopenia 12/04/2021   Prediabetes 12/04/2021   Subungual hematoma of toe 12/09/2021   Vitamin D deficiency 12/09/2021   Asymptomatic microscopic hematuria 02/01/2022   Panic attack 02/01/2022   Right hip pain 04/14/2022   Unable to obtain healthcare due to limited financial resources 04/14/2022   Leg swelling 04/14/2022   Chronic low back pain with bilateral sciatica 05/12/2022   Resolved Ambulatory Problems    Diagnosis Date Noted   Melanonychia 02/01/2022   Abnormal finding on urinalysis 02/01/2022   Past Medical History:  Diagnosis Date   Allergy    Anemia    Cataract    Colon polyp 03/11/2007   Fibromyalgia    GERD (gastroesophageal reflux disease)    Hyperlipidemia    Hypertension    IBS (irritable bowel syndrome)    Internal hemorrhoid     Outpatient Medications Prior to Visit  Medication Sig   calcium-vitamin D (OSCAL WITH D) 500-5 MG-MCG tablet Take 1 tablet by mouth daily with breakfast.   Evolocumab (REPATHA) 140 MG/ML SOSY Inject 140 mg into the skin every 14 (fourteen) days.   furosemide (LASIX) 20 MG tablet Take 1 tablet (20 mg total) by mouth daily. Only as needed for leg swelling   telmisartan (MICARDIS) 20 MG tablet Take 1 tablet (20 mg total) by mouth daily. Replaces valsartan and valsartan-hctz   No facility-administered medications prior to visit.           Clinical Data  Analysis:   Physical Exam  BP (!) 140/88 (BP Location: Left Arm, Patient Position: Sitting)  Pulse 68   Temp 98.4 F (36.9 C) (Temporal)   Ht 5\' 1"  (1.549 m)   Wt 178 lb 6.4 oz (80.9 kg)   SpO2 99%   BMI 33.71 kg/m  Wt Readings from Last 10 Encounters:  05/12/22 178 lb 6.4 oz (80.9 kg)  04/14/22 177 lb 9.6 oz (80.6 kg)  04/08/22 177 lb (80.3 kg)  02/01/22 177 lb 12.8 oz (80.6 kg)  12/09/21 180 lb (81.6 kg)  12/02/21 179 lb 3.2 oz (81.3 kg)  02/27/20 167 lb (75.8 kg)  12/20/19 166 lb (75.3 kg)  11/05/19 167 lb 3.2 oz (75.8 kg)  08/10/19 168 lb (76.2 kg)   Vital signs reviewed.  Nursing notes reviewed. Weight trend reviewed. Abnormalities and Problem-Specific physical exam findings:  she indicates pain without significant tenderness down R upper gluteus around outside of right hip and down onto medial thigh but not the groin. When shown images of l2 dermatome she felt there was good overlap of where she felt pain General Appearance:  No acute distress appreciable.   Well-groomed, healthy-appearing female.  Well proportioned with no abnormal fat distribution.  Good muscle tone. Skin: Clear and well-hydrated. Pulmonary:  Normal work of breathing at rest, no respiratory distress apparent. SpO2: 99 %  Musculoskeletal: All extremities are intact.  Neurological:  Awake, alert, oriented, and engaged.  No obvious focal neurological deficits or cognitive impairments.  Sensorium seems unclouded.   Speech is clear and coherent with logical content. Psychiatric:  Appropriate mood, pleasant and cooperative demeanor, cheerful and engaged during the exam   Additional Results Reviewed:     No results found for any visits on 05/12/22.  No results found for this or any previous visit (from the past 2160 hour(s)).  No image results found.   MM DIAG BREAST TOMO BILATERAL  Result Date: 03/02/2022 CLINICAL DATA:  72 year old female presenting for evaluation of focal pain intermittent redness in  the medial left breast. Patient reports she has tenderness today but no redness. EXAM: DIGITAL DIAGNOSTIC BILATERAL MAMMOGRAM WITH TOMOSYNTHESIS; ULTRASOUND LEFT BREAST LIMITED TECHNIQUE: Bilateral digital diagnostic mammography and breast tomosynthesis was performed.; Targeted ultrasound examination of the left breast was performed. COMPARISON:  Previous exam(s). ACR Breast Density Category b: There are scattered areas of fibroglandular density. FINDINGS: Mammogram: Right breast: No suspicious mass, distortion, or microcalcifications are identified to suggest presence of malignancy. Left breast: A skin BB marks the site of pain reported by the patient in the medial right breast. A spot tangential view of this area was performed in addition to standard views. There is no new abnormality at the site of pain or elsewhere in the left breast to suggest the presence of malignancy. On physical exam of the medial left breast I do not feel a discrete mass or focal area of thickening. Ultrasound: Targeted ultrasound performed at the site of pain reported by the patient in the left breast at 9-10 o'clock demonstrating no cystic or solid mass. IMPRESSION: 1. No mammographic or sonographic evidence of malignancy or other imaging abnormality at the site of pain reported by the patient in the medial left breast. 2. No mammographic evidence of malignancy in the right breast. RECOMMENDATION: 1. Clinical follow-up as needed for intermittent left breast pain and tenderness. Breast pain is a common condition, which will often resolve on its own without intervention. It can be affected by hormonal changes, medication side effect, weight changes and fit of the bra. Pain may also be referred from other adjacent areas of the body.  Breast pain may be improved by wearing adequate well-fitting support, over-the-counter topical and oral NSAID medication, low-fat diet, and ice/heat as needed. Studies have shown an improvement in cyclic pain  with use of evening primrose oil and vitamin E. 2.  Screening mammogram in one year.(Code:SM-B-01Y) I have discussed the findings and recommendations with the patient. If applicable, a reminder letter will be sent to the patient regarding the next appointment. BI-RADS CATEGORY  1: Negative. Electronically Signed   By: Emmaline Kluver M.D.   On: 03/02/2022 13:22   US BREAST LTD UNI LEFT INC AXILLA  Result Date: 03/02/2022 CLINICAL DATA:  72 year old female presenting for evaluation of focal pain intermittent redness in the medial left breast. Patient reports she has tenderness today but no redness. EXAM: DIGITAL DIAGNOSTIC BILATERAL MAMMOGRAM WITH TOMOSYNTHESIS; ULTRASOUND LEFT BREAST LIMITED TECHNIQUE: Bilateral digital diagnostic mammography and breast tomosynthesis was performed.; Targeted ultrasound examination of the left breast was performed. COMPARISON:  Previous exam(s). ACR Breast Density Category b: There are scattered areas of fibroglandular density. FINDINGS: Mammogram: Right breast: No suspicious mass, distortion, or microcalcifications are identified to suggest presence of malignancy. Left breast: A skin BB marks the site of pain reported by the patient in the medial right breast. A spot tangential view of this area was performed in addition to standard views. There is no new abnormality at the site of pain or elsewhere in the left breast to suggest the presence of malignancy. On physical exam of the medial left breast I do not feel a discrete mass or focal area of thickening. Ultrasound: Targeted ultrasound performed at the site of pain reported by the patient in the left breast at 9-10 o'clock demonstrating no cystic or solid mass. IMPRESSION: 1. No mammographic or sonographic evidence of malignancy or other imaging abnormality at the site of pain reported by the patient in the medial left breast. 2. No mammographic evidence of malignancy in the right breast. RECOMMENDATION: 1. Clinical  follow-up as needed for intermittent left breast pain and tenderness. Breast pain is a common condition, which will often resolve on its own without intervention. It can be affected by hormonal changes, medication side effect, weight changes and fit of the bra. Pain may also be referred from other adjacent areas of the body. Breast pain may be improved by wearing adequate well-fitting support, over-the-counter topical and oral NSAID medication, low-fat diet, and ice/heat as needed. Studies have shown an improvement in cyclic pain with use of evening primrose oil and vitamin E. 2.  Screening mammogram in one year.(Code:SM-B-01Y) I have discussed the findings and recommendations with the patient. If applicable, a reminder letter will be sent to the patient regarding the next appointment. BI-RADS CATEGORY  1: Negative. Electronically Signed   By: Emmaline Kluver M.D.   On: 03/02/2022 13:22    --------------------------------    Signed: Lula Olszewski, MD 05/13/2022 5:56 PM

## 2022-05-20 ENCOUNTER — Telehealth: Payer: Self-pay

## 2022-05-20 NOTE — Telephone Encounter (Signed)
PA approved.   Request Reference Number: [id number]. REPATHA SURE INJ 140MG /ML is approved through 11/20/2022. Your patient may now fill this prescription and it will be covered.. Authorization Expiration Date: November 20, 2022.

## 2022-05-20 NOTE — Telephone Encounter (Signed)
PA initiated via Covermymeds; KEY: [id number]. Awaiting determination.

## 2023-06-01 ENCOUNTER — Other Ambulatory Visit: Payer: Self-pay | Admitting: Internal Medicine

## 2023-06-01 DIAGNOSIS — Z1231 Encounter for screening mammogram for malignant neoplasm of breast: Secondary | ICD-10-CM

## 2023-06-22 ENCOUNTER — Ambulatory Visit
Admission: RE | Admit: 2023-06-22 | Discharge: 2023-06-22 | Disposition: A | Discharge status: Home / Self Care | Source: Ambulatory Visit | Attending: Internal Medicine | Admitting: Internal Medicine

## 2023-06-22 DIAGNOSIS — Z1231 Encounter for screening mammogram for malignant neoplasm of breast: Secondary | ICD-10-CM

## 2023-06-25 ENCOUNTER — Ambulatory Visit: Payer: Self-pay | Admitting: Internal Medicine

## 2023-10-05 ENCOUNTER — Other Ambulatory Visit: Payer: Self-pay | Admitting: Family Medicine

## 2023-10-05 DIAGNOSIS — R200 Anesthesia of skin: Secondary | ICD-10-CM

## 2023-10-20 ENCOUNTER — Encounter: Payer: Self-pay | Admitting: Family Medicine

## 2023-10-21 ENCOUNTER — Inpatient Hospital Stay: Admission: RE | Admit: 2023-10-21 | Source: Ambulatory Visit

## 2023-10-24 ENCOUNTER — Ambulatory Visit
Admission: RE | Admit: 2023-10-24 | Discharge: 2023-10-24 | Disposition: A | Source: Ambulatory Visit | Attending: Family Medicine

## 2023-10-24 DIAGNOSIS — R200 Anesthesia of skin: Secondary | ICD-10-CM
